# Patient Record
Sex: Female | Born: 1940 | Race: White | Hispanic: No | Smoking: Never smoker
Health system: Southern US, Community
[De-identification: ages and names within clinical notes are randomized; demographics above are authoritative.]

## PROBLEM LIST (undated history)

## (undated) DIAGNOSIS — F419 Anxiety disorder, unspecified: Secondary | ICD-10-CM

## (undated) DIAGNOSIS — E119 Type 2 diabetes mellitus without complications: Secondary | ICD-10-CM

## (undated) DIAGNOSIS — I872 Venous insufficiency (chronic) (peripheral): Secondary | ICD-10-CM

## (undated) DIAGNOSIS — E559 Vitamin D deficiency, unspecified: Secondary | ICD-10-CM

## (undated) DIAGNOSIS — E1142 Type 2 diabetes mellitus with diabetic polyneuropathy: Secondary | ICD-10-CM

## (undated) DIAGNOSIS — G40309 Generalized idiopathic epilepsy and epileptic syndromes, not intractable, without status epilepticus: Secondary | ICD-10-CM

## (undated) DIAGNOSIS — I2510 Atherosclerotic heart disease of native coronary artery without angina pectoris: Secondary | ICD-10-CM

## (undated) DIAGNOSIS — K219 Gastro-esophageal reflux disease without esophagitis: Secondary | ICD-10-CM

## (undated) DIAGNOSIS — Z9289 Personal history of other medical treatment: Secondary | ICD-10-CM

## (undated) DIAGNOSIS — Z8674 Personal history of sudden cardiac arrest: Secondary | ICD-10-CM

## (undated) DIAGNOSIS — R569 Unspecified convulsions: Secondary | ICD-10-CM

## (undated) DIAGNOSIS — E669 Obesity, unspecified: Secondary | ICD-10-CM

## (undated) DIAGNOSIS — I480 Paroxysmal atrial fibrillation: Secondary | ICD-10-CM

## (undated) DIAGNOSIS — I10 Essential (primary) hypertension: Secondary | ICD-10-CM

## (undated) DIAGNOSIS — I447 Left bundle-branch block, unspecified: Secondary | ICD-10-CM

## (undated) DIAGNOSIS — Z95810 Presence of automatic (implantable) cardiac defibrillator: Secondary | ICD-10-CM

## (undated) DIAGNOSIS — C801 Malignant (primary) neoplasm, unspecified: Secondary | ICD-10-CM

## (undated) DIAGNOSIS — I255 Ischemic cardiomyopathy: Secondary | ICD-10-CM

## (undated) DIAGNOSIS — I6529 Occlusion and stenosis of unspecified carotid artery: Secondary | ICD-10-CM

## (undated) HISTORY — DX: Gastro-esophageal reflux disease without esophagitis: K21.9

## (undated) HISTORY — DX: Ischemic cardiomyopathy: I25.5

## (undated) HISTORY — DX: Personal history of sudden cardiac arrest: Z86.74

## (undated) HISTORY — DX: Left bundle-branch block, unspecified: I44.7

## (undated) HISTORY — DX: Generalized idiopathic epilepsy and epileptic syndromes, not intractable, without status epilepticus: G40.309

## (undated) HISTORY — DX: Occlusion and stenosis of unspecified carotid artery: I65.29

## (undated) HISTORY — DX: Venous insufficiency (chronic) (peripheral): I87.2

## (undated) HISTORY — PX: PARTIAL HYSTERECTOMY: SHX80

## (undated) HISTORY — DX: Type 2 diabetes mellitus with diabetic polyneuropathy: E11.42

## (undated) HISTORY — DX: Atherosclerotic heart disease of native coronary artery without angina pectoris: I25.10

## (undated) HISTORY — DX: Obesity, unspecified: E66.9

## (undated) HISTORY — DX: Type 2 diabetes mellitus without complications: E11.9

## (undated) HISTORY — DX: Personal history of other medical treatment: Z92.89

## (undated) HISTORY — DX: Essential (primary) hypertension: I10

## (undated) HISTORY — DX: Vitamin D deficiency, unspecified: E55.9

## (undated) HISTORY — DX: Presence of automatic (implantable) cardiac defibrillator: Z95.810

---

## 1979-02-05 HISTORY — PX: CHOLECYSTECTOMY: SHX55

## 1984-06-06 HISTORY — PX: TOTAL ABDOMINAL HYSTERECTOMY: SHX209

## 2006-06-06 HISTORY — PX: CORONARY ARTERY BYPASS GRAFT: SHX141

## 2008-12-12 ENCOUNTER — Ambulatory Visit: Payer: Self-pay | Admitting: Vascular Surgery

## 2009-04-21 ENCOUNTER — Observation Stay (HOSPITAL_COMMUNITY): Admission: EM | Admit: 2009-04-21 | Discharge: 2009-04-22 | Payer: Self-pay | Admitting: Emergency Medicine

## 2009-10-19 ENCOUNTER — Emergency Department (HOSPITAL_COMMUNITY): Admission: EM | Admit: 2009-10-19 | Discharge: 2009-10-19 | Payer: Self-pay | Admitting: Emergency Medicine

## 2010-02-02 ENCOUNTER — Ambulatory Visit: Payer: Self-pay | Admitting: Cardiovascular Disease

## 2010-03-17 ENCOUNTER — Ambulatory Visit: Payer: Self-pay | Admitting: Cardiovascular Disease

## 2010-06-06 HISTORY — PX: SUBDURAL HEMATOMA EVACUATION VIA CRANIOTOMY: SUR319

## 2010-06-28 ENCOUNTER — Encounter: Payer: Self-pay | Admitting: Endocrinology

## 2010-08-17 ENCOUNTER — Ambulatory Visit (INDEPENDENT_AMBULATORY_CARE_PROVIDER_SITE_OTHER): Payer: PRIVATE HEALTH INSURANCE | Admitting: Cardiovascular Disease

## 2010-08-17 DIAGNOSIS — I119 Hypertensive heart disease without heart failure: Secondary | ICD-10-CM

## 2010-08-17 DIAGNOSIS — Z951 Presence of aortocoronary bypass graft: Secondary | ICD-10-CM

## 2010-08-23 LAB — DIFFERENTIAL
Basophils Absolute: 0 10*3/uL (ref 0.0–0.1)
Basophils Relative: 0 % (ref 0–1)
Lymphocytes Relative: 8 % — ABNORMAL LOW (ref 12–46)
Lymphs Abs: 0.7 10*3/uL (ref 0.7–4.0)
Monocytes Absolute: 0.4 10*3/uL (ref 0.1–1.0)
Monocytes Relative: 5 % (ref 3–12)
Neutrophils Relative %: 86 % — ABNORMAL HIGH (ref 43–77)

## 2010-08-23 LAB — CBC
Hemoglobin: 10.8 g/dL — ABNORMAL LOW (ref 12.0–15.0)
MCHC: 34 g/dL (ref 30.0–36.0)
Platelets: 147 10*3/uL — ABNORMAL LOW (ref 150–400)
RBC: 3.45 MIL/uL — ABNORMAL LOW (ref 3.87–5.11)

## 2010-08-23 LAB — URINALYSIS, ROUTINE W REFLEX MICROSCOPIC
Hgb urine dipstick: NEGATIVE
Protein, ur: NEGATIVE mg/dL
Urobilinogen, UA: 0.2 mg/dL (ref 0.0–1.0)

## 2010-08-23 LAB — BASIC METABOLIC PANEL
CO2: 27 mEq/L (ref 19–32)
GFR calc Af Amer: 41 mL/min — ABNORMAL LOW (ref >60)
Glucose, Bld: 306 mg/dL — ABNORMAL HIGH (ref 70–99)
Potassium: 4.8 mEq/L (ref 3.5–5.1)
Sodium: 134 mEq/L — ABNORMAL LOW (ref 135–145)

## 2010-08-23 LAB — URINE CULTURE: Colony Count: 70000

## 2010-08-31 ENCOUNTER — Ambulatory Visit: Payer: Self-pay | Admitting: Cardiovascular Disease

## 2010-08-31 ENCOUNTER — Other Ambulatory Visit: Payer: Self-pay | Admitting: Family Medicine

## 2010-08-31 ENCOUNTER — Ambulatory Visit
Admission: RE | Admit: 2010-08-31 | Discharge: 2010-08-31 | Disposition: A | Discharge status: Home / Self Care | Payer: PRIVATE HEALTH INSURANCE | Source: Ambulatory Visit | Attending: Family Medicine | Admitting: Family Medicine

## 2010-08-31 DIAGNOSIS — R109 Unspecified abdominal pain: Secondary | ICD-10-CM

## 2010-08-31 DIAGNOSIS — R10819 Abdominal tenderness, unspecified site: Secondary | ICD-10-CM

## 2010-09-06 ENCOUNTER — Encounter: Payer: Self-pay | Admitting: Cardiovascular Disease

## 2010-09-06 ENCOUNTER — Encounter: Payer: Self-pay | Admitting: Nurse Practitioner

## 2010-09-07 ENCOUNTER — Ambulatory Visit: Payer: PRIVATE HEALTH INSURANCE | Admitting: Nurse Practitioner

## 2010-09-08 LAB — CARDIAC PANEL(CRET KIN+CKTOT+MB+TROPI)
CK, MB: 6 ng/mL — ABNORMAL HIGH (ref 0.3–4.0)
Relative Index: 3.2 — ABNORMAL HIGH (ref 0.0–2.5)
Relative Index: 3.4 — ABNORMAL HIGH (ref 0.0–2.5)
Total CK: 190 U/L — ABNORMAL HIGH (ref 7–177)
Troponin I: 0.01 ng/mL (ref 0.00–0.06)
Troponin I: 0.02 ng/mL (ref 0.00–0.06)
Troponin I: 0.03 ng/mL (ref 0.00–0.06)

## 2010-09-08 LAB — COMPREHENSIVE METABOLIC PANEL
ALT: 48 U/L — ABNORMAL HIGH (ref 0–35)
AST: 54 U/L — ABNORMAL HIGH (ref 0–37)
Albumin: 3.2 g/dL — ABNORMAL LOW (ref 3.5–5.2)
CO2: 25 mEq/L (ref 19–32)
Calcium: 9.2 mg/dL (ref 8.4–10.5)
Chloride: 105 mEq/L (ref 96–112)
Creatinine, Ser: 1.98 mg/dL — ABNORMAL HIGH (ref 0.4–1.2)
Creatinine, Ser: 2.21 mg/dL — ABNORMAL HIGH (ref 0.4–1.2)
GFR calc Af Amer: 27 mL/min — ABNORMAL LOW (ref >60)
GFR calc Af Amer: 30 mL/min — ABNORMAL LOW (ref >60)
Glucose, Bld: 151 mg/dL — ABNORMAL HIGH (ref 70–99)
Potassium: 3.6 mEq/L (ref 3.5–5.1)
Sodium: 134 mEq/L — ABNORMAL LOW (ref 135–145)
Total Bilirubin: 0.6 mg/dL (ref 0.3–1.2)
Total Protein: 6.2 g/dL (ref 6.0–8.3)

## 2010-09-08 LAB — URINE MICROSCOPIC-ADD ON

## 2010-09-08 LAB — DIFFERENTIAL
Basophils Absolute: 0 10*3/uL (ref 0.0–0.1)
Eosinophils Absolute: 0.1 10*3/uL (ref 0.0–0.7)
Eosinophils Relative: 2 % (ref 0–5)
Monocytes Absolute: 0.5 10*3/uL (ref 0.1–1.0)
Monocytes Relative: 9 % (ref 3–12)
Neutro Abs: 3.6 10*3/uL (ref 1.7–7.7)
Neutrophils Relative %: 69 % (ref 43–77)

## 2010-09-08 LAB — CK TOTAL AND CKMB (NOT AT ARMC)
CK, MB: 12.9 ng/mL — ABNORMAL HIGH (ref 0.3–4.0)
Relative Index: 3.8 — ABNORMAL HIGH (ref 0.0–2.5)
Relative Index: 3.8 — ABNORMAL HIGH (ref 0.0–2.5)

## 2010-09-08 LAB — URINALYSIS, ROUTINE W REFLEX MICROSCOPIC
Bilirubin Urine: NEGATIVE
Glucose, UA: 100 mg/dL — AB
Nitrite: NEGATIVE
Specific Gravity, Urine: 1.016 (ref 1.005–1.030)
pH: 5 (ref 5.0–8.0)

## 2010-09-08 LAB — IRON AND TIBC
Saturation Ratios: 20 % (ref 20–55)
UIBC: 330 ug/dL

## 2010-09-08 LAB — POCT CARDIAC MARKERS
CKMB, poc: 10.9 ng/mL (ref 1.0–8.0)
Troponin i, poc: <0.05 ng/mL (ref 0.00–0.09)

## 2010-09-08 LAB — GLUCOSE, CAPILLARY: Glucose-Capillary: 175 mg/dL — ABNORMAL HIGH (ref 70–99)

## 2010-09-08 LAB — HEMOGLOBIN A1C: Hgb A1c MFr Bld: 9.1 % — ABNORMAL HIGH (ref 4.6–6.1)

## 2010-09-08 LAB — RETICULOCYTES: Retic Count, Absolute: 35.1 10*3/uL (ref 19.0–186.0)

## 2010-09-08 LAB — FOLATE: Folate: >20 ng/mL

## 2010-09-08 LAB — CBC
HCT: 28.6 % — ABNORMAL LOW (ref 36.0–46.0)
RBC: 3.13 MIL/uL — ABNORMAL LOW (ref 3.87–5.11)
WBC: 5.1 10*3/uL (ref 4.0–10.5)

## 2010-09-08 LAB — TSH: TSH: 3.475 u[IU]/mL (ref 0.350–4.500)

## 2010-09-08 LAB — D-DIMER, QUANTITATIVE: D-Dimer, Quant: 0.23 ug/mL-FEU (ref 0.00–0.48)

## 2010-09-09 ENCOUNTER — Encounter: Payer: Self-pay | Admitting: Nurse Practitioner

## 2010-09-14 ENCOUNTER — Encounter: Payer: Self-pay | Admitting: Nurse Practitioner

## 2010-09-14 ENCOUNTER — Ambulatory Visit (INDEPENDENT_AMBULATORY_CARE_PROVIDER_SITE_OTHER): Payer: PRIVATE HEALTH INSURANCE | Admitting: Nurse Practitioner

## 2010-09-14 VITALS — BP 160/80 | HR 60 | Ht 63.0 in | Wt 195.8 lb

## 2010-09-14 DIAGNOSIS — I2510 Atherosclerotic heart disease of native coronary artery without angina pectoris: Secondary | ICD-10-CM

## 2010-09-14 DIAGNOSIS — I480 Paroxysmal atrial fibrillation: Secondary | ICD-10-CM | POA: Insufficient documentation

## 2010-09-14 DIAGNOSIS — E669 Obesity, unspecified: Secondary | ICD-10-CM

## 2010-09-14 DIAGNOSIS — I4891 Unspecified atrial fibrillation: Secondary | ICD-10-CM

## 2010-09-14 DIAGNOSIS — I10 Essential (primary) hypertension: Secondary | ICD-10-CM

## 2010-09-14 MED ORDER — LISINOPRIL 10 MG PO TABS: 10.0000 mg | ORAL_TABLET | Freq: Two times a day (BID) | ORAL | Status: DC

## 2010-09-14 NOTE — Assessment & Plan Note (Signed)
Remains on coumadin. Is in sinus rhythm by exam today.

## 2010-09-14 NOTE — Patient Instructions (Signed)
Continue with your current medicines. We are going to increase the Lisinopril to 10mg  2 x a day. [facility] Monitor your blood pressure at home.  Record your readings and bring to your next visit. Limit sodium intake. Call for any problems. I will see you in a month.

## 2010-09-14 NOTE — Progress Notes (Signed)
History of Present Illness: Alexandra [name] is seen today for a follow up visit. She is seen for Dr. Elease Hashimoto. He has placed her on 20mg  of bystolic for her blood pressure. She is tolerating her medicines well. She has no real complaint. She does have some occasional chest pain, nothing like her prior chest pain syndrome when she had her surgery. She has not had follow up stress testing since her CABG in 2008. Blood pressure at home is still in the 140's. Blood sugar is improving.   Current Outpatient Prescriptions on File Prior to Visit  Medication Sig Dispense Refill  . aspirin 81 MG tablet Take 81 mg by mouth daily.        . insulin glargine (LANTUS) 100 UNIT/ML injection Inject into the skin. Taking 50 units in the morning and 35 units in the evening.      . lovastatin (MEVACOR) 40 MG tablet Take 40 mg by mouth 2 (two) times daily.        . nebivolol (BYSTOLIC) 10 MG tablet Take 20 mg by mouth daily.       . pantoprazole (PROTONIX) 40 MG tablet Take 40 mg by mouth daily.        . Vitamin D, Ergocalciferol, (DRISDOL) 50000 UNITS CAPS Take 50,000 Units by mouth every 7 (seven) days.        . Warfarin Sodium (COUMADIN PO) Take by mouth. As directed       . DISCONTD: lisinopril (PRINIVIL,ZESTRIL) 10 MG tablet Take 10 mg by mouth daily.        . fexofenadine (ALLEGRA) 180 MG tablet Take 180 mg by mouth daily.          Allergies  Allergen Reactions  . Sulfa Antibiotics     Past Medical History  Diagnosis Date  . HTN (hypertension)   . CAD (coronary artery disease)     Remote CABG in 2008  . DM (diabetes mellitus)   . HTN (hypertension)   . Atrial fibrillation   . GERD (gastroesophageal reflux disease)   . Breast cancer   . Venous insufficiency   . Left bundle branch block   . Obesity     Past Surgical History  Procedure Date  . Partial hysterectomy     1985  . Total abdominal hysterectomy 1986  . Coronary artery bypass graft 2008    x3 2008.  [place] (Dr. Edrick Oh) [facility].  [phone]  . Cholecystectomy     History  Smoking status  . Never Smoker   Smokeless tobacco  . Not on file    History  Alcohol Use: Not on file    Family History  Problem Relation Age of Onset  . Emphysema Father   . Hypertension Father   . Hypertension Mother   . Stroke Mother   . Diabetes Mother   . Diabetes Brother     Review of Systems: The review of systems is positive for occasional episodes of chest discomfort. She has no exertional symptoms. She has started walking on her treadmill. She is trying to lose weight. Coumdin is managed by her PCP.  All other systems were reviewed and are negative.  Physical Exam: BP 160/80  Pulse 60  Ht 5\' 3"  (1.6 m)  Wt 195 lb 12.8 oz (88.814 kg)  BMI 34.68 kg/m2 Patient is very pleasant and in no acute distress. She is obese. Skin is warm and dry. Color is normal.  HEENT is unremarkable. Normocephalic/atraumatic. PERRL. Sclera are nonicteric. Neck is supple.  No masses. No JVD. Lungs are clear. Cardiac exam shows a regular rate and rhythm. She appears to be in sinus rhythm today.  Abdomen is soft. Extremities are without edema. Gait and ROM are intact. No gross neurologic deficits noted.    Assessment / Plan:

## 2010-09-14 NOTE — Assessment & Plan Note (Signed)
Need to consider stress testing on return.

## 2010-09-14 NOTE — Assessment & Plan Note (Signed)
Weight loss is encouraged 

## 2010-09-14 NOTE — Assessment & Plan Note (Signed)
Blood pressure still not at goal. Lisinopril is increased to 10mg  BID. [facility]

## 2010-09-21 ENCOUNTER — Other Ambulatory Visit: Payer: Self-pay | Admitting: Cardiovascular Disease

## 2010-09-22 ENCOUNTER — Other Ambulatory Visit: Payer: Self-pay | Admitting: *Deleted

## 2010-09-22 DIAGNOSIS — E785 Hyperlipidemia, unspecified: Secondary | ICD-10-CM

## 2010-09-22 MED ORDER — LOVASTATIN 40 MG PO TABS: 40.0000 mg | ORAL_TABLET | Freq: Every day | ORAL | Status: DC

## 2010-09-22 MED ORDER — LOVASTATIN 40 MG PO TABS: 40.0000 mg | ORAL_TABLET | Freq: Two times a day (BID) | ORAL | Status: DC

## 2010-09-22 NOTE — Telephone Encounter (Signed)
Fax received from pharmacy. Refill completed. Jodette Isabela Nardelli RN  

## 2010-10-12 ENCOUNTER — Ambulatory Visit (INDEPENDENT_AMBULATORY_CARE_PROVIDER_SITE_OTHER): Payer: PRIVATE HEALTH INSURANCE | Admitting: Nurse Practitioner

## 2010-10-12 ENCOUNTER — Telehealth: Payer: Self-pay | Admitting: *Deleted

## 2010-10-12 ENCOUNTER — Other Ambulatory Visit (INDEPENDENT_AMBULATORY_CARE_PROVIDER_SITE_OTHER): Payer: PRIVATE HEALTH INSURANCE | Admitting: *Deleted

## 2010-10-12 ENCOUNTER — Encounter: Payer: Self-pay | Admitting: Nurse Practitioner

## 2010-10-12 VITALS — BP 130/80 | HR 60 | Wt 193.0 lb

## 2010-10-12 DIAGNOSIS — I10 Essential (primary) hypertension: Secondary | ICD-10-CM

## 2010-10-12 DIAGNOSIS — I2510 Atherosclerotic heart disease of native coronary artery without angina pectoris: Secondary | ICD-10-CM

## 2010-10-12 LAB — BASIC METABOLIC PANEL
BUN: 25 mg/dL — ABNORMAL HIGH (ref 6–23)
CO2: 29 mEq/L (ref 19–32)
Calcium: 9.3 mg/dL (ref 8.4–10.5)
Chloride: 105 mEq/L (ref 96–112)
Creatinine, Ser: 1.5 mg/dL — ABNORMAL HIGH (ref 0.4–1.2)
GFR: 37.07 mL/min — ABNORMAL LOW (ref >60.00)
Glucose, Bld: 140 mg/dL — ABNORMAL HIGH (ref 70–99)
Potassium: 4.4 mEq/L (ref 3.5–5.1)
Sodium: 142 mEq/L (ref 135–145)

## 2010-10-12 NOTE — Progress Notes (Signed)
Alexandra [name] Date of Birth: 1941-04-13   History of Present Illness: Alexandra [name] is seen back today for a follow up visit. She is seen for Dr. Elease [name]. It is a one month check. She has had her Lisinopril increased to BID for better blood pressure control. She is doing well. She is fatigued. She does not have chest pain. Her blood sugars are good. She is not lightheaded or dizzy. She has been checking her blood pressure at home and her readings remain high. Her cuff does not correlate here in the office. Coumadin is checked by primary care.   Current Outpatient Prescriptions on File Prior to Visit  Medication Sig Dispense Refill  . aspirin 81 MG tablet Take 81 mg by mouth daily.        . insulin aspart (NOVOLOG) 100 UNIT/ML injection Inject into the skin 3 (three) times daily before meals. As directed       . insulin glargine (LANTUS) 100 UNIT/ML injection Inject into the skin. Taking 50 units in the morning and 35 units in the evening.      Marland Kitchen lisinopril (PRINIVIL,ZESTRIL) 10 MG tablet Take 1 tablet (10 mg total) by mouth 2 (two) times daily.  60 tablet  11  . lovastatin (MEVACOR) 40 MG tablet Take 1 tablet (40 mg total) by mouth daily.  90 tablet  3  . nebivolol (BYSTOLIC) 10 MG tablet Take 20 mg by mouth daily.       . pantoprazole (PROTONIX) 40 MG tablet Take 40 mg by mouth daily.        . sertraline (ZOLOFT) 100 MG tablet Take 100 mg by mouth daily.        . Vitamin D, Ergocalciferol, (DRISDOL) 50000 UNITS CAPS Take 50,000 Units by mouth every 7 (seven) days.        . Warfarin Sodium (COUMADIN PO) Take by mouth. As directed       . fexofenadine (ALLEGRA) 180 MG tablet Take 180 mg by mouth daily.          Allergies  Allergen Reactions  . Sulfa Antibiotics     Past Medical History  Diagnosis Date  . HTN (hypertension)   . CAD (coronary artery disease)     Remote CABG in 2008  . DM (diabetes mellitus)   . HTN (hypertension)   . Atrial fibrillation   . GERD (gastroesophageal reflux  disease)   . Breast cancer   . Venous insufficiency   . Left bundle branch block   . Obesity   . Chronic anticoagulation     Past Surgical History  Procedure Date  . Partial hysterectomy     1985  . Total abdominal hysterectomy 1986  . Coronary artery bypass graft 2008    x3 2008.  [place] (Dr. Edrick [facility]) [facility] [phone]  . Cholecystectomy     History  Smoking status  . Never Smoker   Smokeless tobacco  . Never Used    History  Alcohol Use No    Family History  Problem Relation Age of Onset  . Emphysema Father   . Hypertension Father   . Hypertension Mother   . Stroke Mother   . Diabetes Mother   . Diabetes Brother     Review of Systems: The review of systems is positive for fatigue. She tries to exercise. She notes that her legs will tend to cramp up with activity. She has cramps at night. She will have some occasional edema. She is trying to watch  her salt. She denies chest pain.  All other systems were reviewed and are negative.  Physical Exam: BP 130/80  Pulse 60  Wt 193 lb (87.544 kg) Patient is very pleasant and in no acute distress. She is overweight. Skin is warm and dry. Color is normal.  HEENT is unremarkable. Normocephalic/atraumatic. PERRL. Sclera are nonicteric. Neck is supple. No masses. No JVD. Lungs are clear. Cardiac exam shows a regular rate and rhythm and she appears to be in sinus today by exam.  Abdomen is soft and obese.  Extremities are without significant edema. Gait and ROM are intact. No gross neurologic deficits noted.  LABORATORY DATA:  BMET is pending   Assessment / Plan:

## 2010-10-12 NOTE — Assessment & Plan Note (Signed)
Had CABG about 4 years ago in [place]. May need to consider stress testing on return. Regular exercise, good blood sugar control and weight loss is encouraged.

## 2010-10-12 NOTE — Patient Instructions (Signed)
Continue with your current medicines. Monitor your blood pressure at home. I would encourage you to get a new cuff. The Omron is a good brand to choose from. Record your readings and bring to your next visit. Limit sodium intake. Call for any problems. I will have you see Dr. Elease Hashimoto in 3 to 4 months.

## 2010-10-12 NOTE — Telephone Encounter (Signed)
Message copied by Mahalia Longest on Tue Oct 12, 2010  5:04 PM ------      Message from: Norma Fredrickson      Created: Tue Oct 12, 2010  2:19 PM       BMET is ok. Continue with current medicines.

## 2010-10-12 NOTE — Assessment & Plan Note (Signed)
Her cuff does not correlate. Repeat blood pressure check by me is satisfactory as well. I do not think she needs additional medicines at this time. She is encouraged to obtain a new cuff. I will have her see Dr. Elease Hashimoto back in about 3 to 4 months. Patient is agreeable to this plan and will call if any problems develop in the interim.

## 2010-10-12 NOTE — Telephone Encounter (Signed)
Patient called with lab results. Pt verbalized understanding. Jodette Kyree Adriano RN  

## 2010-10-29 ENCOUNTER — Telehealth: Payer: Self-pay | Admitting: Cardiovascular Disease

## 2010-10-29 ENCOUNTER — Other Ambulatory Visit: Payer: Self-pay | Admitting: *Deleted

## 2010-10-29 DIAGNOSIS — I10 Essential (primary) hypertension: Secondary | ICD-10-CM

## 2010-10-29 MED ORDER — LISINOPRIL 10 MG PO TABS: 10.0000 mg | ORAL_TABLET | Freq: Two times a day (BID) | ORAL | Status: DC

## 2010-10-29 NOTE — Telephone Encounter (Signed)
Pt wants 90 day supply, ordered, jodette Carsyn Taubman rn

## 2010-10-29 NOTE — Telephone Encounter (Signed)
PT ASKING FOR 90 DAY SUPPLY OF LINISIPRIL CALLED INTO [facility]. PT'S DOSE WAS INCREASED AND NOW SHE IS RUNNING SHORT. CHART IN BOX.

## 2010-12-02 ENCOUNTER — Encounter: Payer: Self-pay | Admitting: Podiatry

## 2010-12-06 ENCOUNTER — Other Ambulatory Visit: Payer: Self-pay | Admitting: *Deleted

## 2010-12-06 MED ORDER — NEBIVOLOL HCL 10 MG PO TABS: 20.0000 mg | ORAL_TABLET | Freq: Every day | ORAL | Status: DC

## 2010-12-06 NOTE — Telephone Encounter (Signed)
Fax received from pharmacy. Refill completed. Jodette Henryetta Corriveau RN  

## 2010-12-27 ENCOUNTER — Emergency Department (HOSPITAL_COMMUNITY): Payer: PRIVATE HEALTH INSURANCE

## 2010-12-27 ENCOUNTER — Emergency Department (HOSPITAL_COMMUNITY)
Admission: EM | Admit: 2010-12-27 | Discharge: 2010-12-27 | Disposition: A | Discharge status: Home / Self Care | Payer: PRIVATE HEALTH INSURANCE | Source: Home / Self Care

## 2010-12-27 DIAGNOSIS — S0003XA Contusion of scalp, initial encounter: Secondary | ICD-10-CM | POA: Insufficient documentation

## 2010-12-27 DIAGNOSIS — E7800 Pure hypercholesterolemia, unspecified: Secondary | ICD-10-CM | POA: Insufficient documentation

## 2010-12-27 DIAGNOSIS — Z79899 Other long term (current) drug therapy: Secondary | ICD-10-CM | POA: Insufficient documentation

## 2010-12-27 DIAGNOSIS — I4891 Unspecified atrial fibrillation: Secondary | ICD-10-CM | POA: Insufficient documentation

## 2010-12-27 DIAGNOSIS — S51009A Unspecified open wound of unspecified elbow, initial encounter: Secondary | ICD-10-CM | POA: Insufficient documentation

## 2010-12-27 DIAGNOSIS — Z853 Personal history of malignant neoplasm of breast: Secondary | ICD-10-CM | POA: Insufficient documentation

## 2010-12-27 DIAGNOSIS — R220 Localized swelling, mass and lump, head: Secondary | ICD-10-CM | POA: Insufficient documentation

## 2010-12-27 DIAGNOSIS — Y92009 Unspecified place in unspecified non-institutional (private) residence as the place of occurrence of the external cause: Secondary | ICD-10-CM | POA: Insufficient documentation

## 2010-12-27 DIAGNOSIS — I10 Essential (primary) hypertension: Secondary | ICD-10-CM | POA: Insufficient documentation

## 2010-12-27 DIAGNOSIS — S1093XA Contusion of unspecified part of neck, initial encounter: Secondary | ICD-10-CM | POA: Insufficient documentation

## 2010-12-27 DIAGNOSIS — E119 Type 2 diabetes mellitus without complications: Secondary | ICD-10-CM | POA: Insufficient documentation

## 2010-12-27 DIAGNOSIS — R221 Localized swelling, mass and lump, neck: Secondary | ICD-10-CM | POA: Insufficient documentation

## 2010-12-27 DIAGNOSIS — W1809XA Striking against other object with subsequent fall, initial encounter: Secondary | ICD-10-CM | POA: Insufficient documentation

## 2010-12-27 DIAGNOSIS — M5020 Other cervical disc displacement, unspecified cervical region: Secondary | ICD-10-CM | POA: Insufficient documentation

## 2010-12-27 LAB — DIFFERENTIAL
Basophils Absolute: 0 10*3/uL (ref 0.0–0.1)
Basophils Relative: 0 % (ref 0–1)
Eosinophils Absolute: 0.1 10*3/uL (ref 0.0–0.7)
Eosinophils Relative: 2 % (ref 0–5)
Lymphs Abs: 0.8 10*3/uL (ref 0.7–4.0)

## 2010-12-27 LAB — CBC
MCV: 86 fL (ref 78.0–100.0)
Platelets: 124 10*3/uL — ABNORMAL LOW (ref 150–400)
RDW: 12.3 % (ref 11.5–15.5)
WBC: 5.9 10*3/uL (ref 4.0–10.5)

## 2010-12-27 LAB — BASIC METABOLIC PANEL
BUN: 30 mg/dL — ABNORMAL HIGH (ref 6–23)
GFR calc non Af Amer: 29 mL/min — ABNORMAL LOW (ref >60)
Glucose, Bld: 289 mg/dL — ABNORMAL HIGH (ref 70–99)
Potassium: 3.7 mEq/L (ref 3.5–5.1)

## 2010-12-29 ENCOUNTER — Inpatient Hospital Stay (HOSPITAL_COMMUNITY)
Admission: EM | Admit: 2010-12-29 | Discharge: 2011-01-05 | DRG: 208 | Disposition: A | Discharge status: Skilled Nursing Facility | Payer: PRIVATE HEALTH INSURANCE | Attending: Internal Medicine | Admitting: Internal Medicine

## 2010-12-29 ENCOUNTER — Emergency Department (HOSPITAL_COMMUNITY): Payer: PRIVATE HEALTH INSURANCE

## 2010-12-29 DIAGNOSIS — S82839A Other fracture of upper and lower end of unspecified fibula, initial encounter for closed fracture: Secondary | ICD-10-CM | POA: Diagnosis present

## 2010-12-29 DIAGNOSIS — I10 Essential (primary) hypertension: Secondary | ICD-10-CM | POA: Diagnosis present

## 2010-12-29 DIAGNOSIS — I2510 Atherosclerotic heart disease of native coronary artery without angina pectoris: Secondary | ICD-10-CM | POA: Diagnosis present

## 2010-12-29 DIAGNOSIS — S066X9A Traumatic subarachnoid hemorrhage with loss of consciousness of unspecified duration, initial encounter: Secondary | ICD-10-CM | POA: Diagnosis present

## 2010-12-29 DIAGNOSIS — E876 Hypokalemia: Secondary | ICD-10-CM | POA: Diagnosis not present

## 2010-12-29 DIAGNOSIS — N179 Acute kidney failure, unspecified: Secondary | ICD-10-CM | POA: Diagnosis present

## 2010-12-29 DIAGNOSIS — Z951 Presence of aortocoronary bypass graft: Secondary | ICD-10-CM

## 2010-12-29 DIAGNOSIS — Z23 Encounter for immunization: Secondary | ICD-10-CM

## 2010-12-29 DIAGNOSIS — I4891 Unspecified atrial fibrillation: Secondary | ICD-10-CM

## 2010-12-29 DIAGNOSIS — J9600 Acute respiratory failure, unspecified whether with hypoxia or hypercapnia: Principal | ICD-10-CM | POA: Diagnosis present

## 2010-12-29 DIAGNOSIS — D649 Anemia, unspecified: Secondary | ICD-10-CM | POA: Diagnosis not present

## 2010-12-29 DIAGNOSIS — S92309A Fracture of unspecified metatarsal bone(s), unspecified foot, initial encounter for closed fracture: Secondary | ICD-10-CM | POA: Diagnosis present

## 2010-12-29 DIAGNOSIS — E785 Hyperlipidemia, unspecified: Secondary | ICD-10-CM | POA: Diagnosis present

## 2010-12-29 DIAGNOSIS — G40802 Other epilepsy, not intractable, without status epilepticus: Secondary | ICD-10-CM | POA: Diagnosis present

## 2010-12-29 DIAGNOSIS — Z79899 Other long term (current) drug therapy: Secondary | ICD-10-CM

## 2010-12-29 DIAGNOSIS — Y9389 Activity, other specified: Secondary | ICD-10-CM

## 2010-12-29 DIAGNOSIS — E119 Type 2 diabetes mellitus without complications: Secondary | ICD-10-CM | POA: Diagnosis present

## 2010-12-29 DIAGNOSIS — Z9181 History of falling: Secondary | ICD-10-CM

## 2010-12-29 DIAGNOSIS — G40401 Other generalized epilepsy and epileptic syndromes, not intractable, with status epilepticus: Secondary | ICD-10-CM

## 2010-12-29 DIAGNOSIS — S42213A Unspecified displaced fracture of surgical neck of unspecified humerus, initial encounter for closed fracture: Secondary | ICD-10-CM | POA: Diagnosis present

## 2010-12-29 DIAGNOSIS — Z7901 Long term (current) use of anticoagulants: Secondary | ICD-10-CM

## 2010-12-29 DIAGNOSIS — Y998 Other external cause status: Secondary | ICD-10-CM

## 2010-12-29 DIAGNOSIS — W1809XA Striking against other object with subsequent fall, initial encounter: Secondary | ICD-10-CM | POA: Diagnosis present

## 2010-12-29 DIAGNOSIS — Z853 Personal history of malignant neoplasm of breast: Secondary | ICD-10-CM

## 2010-12-29 DIAGNOSIS — Y92009 Unspecified place in unspecified non-institutional (private) residence as the place of occurrence of the external cause: Secondary | ICD-10-CM

## 2010-12-29 LAB — CBC
HCT: 37 % (ref 36.0–46.0)
Hemoglobin: 12.4 g/dL (ref 12.0–15.0)
MCH: 30.9 pg (ref 26.0–34.0)
MCHC: 33.5 g/dL (ref 30.0–36.0)

## 2010-12-29 LAB — DIFFERENTIAL
Basophils Relative: 0 % (ref 0–1)
Lymphocytes Relative: 27 % (ref 12–46)
Monocytes Absolute: 0.9 10*3/uL (ref 0.1–1.0)
Monocytes Relative: 6 % (ref 3–12)
Neutro Abs: 11.4 10*3/uL — ABNORMAL HIGH (ref 1.7–7.7)

## 2010-12-29 LAB — COMPREHENSIVE METABOLIC PANEL
Alkaline Phosphatase: 81 U/L (ref 39–117)
BUN: 24 mg/dL — ABNORMAL HIGH (ref 6–23)
Chloride: 100 mEq/L (ref 96–112)
GFR calc Af Amer: 35 mL/min — ABNORMAL LOW (ref >60)
Glucose, Bld: 232 mg/dL — ABNORMAL HIGH (ref 70–99)
Potassium: 4.4 mEq/L (ref 3.5–5.1)
Total Bilirubin: 0.3 mg/dL (ref 0.3–1.2)

## 2010-12-29 LAB — CK TOTAL AND CKMB (NOT AT ARMC): Total CK: 175 U/L (ref 7–177)

## 2010-12-30 ENCOUNTER — Inpatient Hospital Stay (HOSPITAL_COMMUNITY)
Admit: 2010-12-30 | Discharge: 2010-12-30 | Disposition: A | Discharge status: Home / Self Care | Payer: PRIVATE HEALTH INSURANCE | Attending: Emergency Medicine | Admitting: Emergency Medicine

## 2010-12-30 ENCOUNTER — Inpatient Hospital Stay (HOSPITAL_COMMUNITY): Payer: PRIVATE HEALTH INSURANCE

## 2010-12-30 LAB — BLOOD GAS, ARTERIAL
Acid-base deficit: 2.9 mmol/L — ABNORMAL HIGH (ref 0.0–2.0)
Drawn by: 347861
FIO2: 1 %
O2 Saturation: 99.3 %
RATE: 12 resp/min
TCO2: 19.7 mmol/L (ref 0–100)
pO2, Arterial: 209 mmHg — ABNORMAL HIGH (ref 80.0–100.0)

## 2010-12-30 LAB — URINALYSIS, ROUTINE W REFLEX MICROSCOPIC
Bilirubin Urine: NEGATIVE
Glucose, UA: >1000 mg/dL — AB
Ketones, ur: NEGATIVE mg/dL
Leukocytes, UA: NEGATIVE
pH: 5 (ref 5.0–8.0)

## 2010-12-30 LAB — URINE MICROSCOPIC-ADD ON

## 2010-12-30 LAB — CARDIAC PANEL(CRET KIN+CKTOT+MB+TROPI)
CK, MB: 6.2 ng/mL (ref 0.3–4.0)
Total CK: 195 U/L — ABNORMAL HIGH (ref 7–177)
Troponin I: <0.3 ng/mL (ref <0.30)

## 2010-12-30 LAB — BASIC METABOLIC PANEL
BUN: 21 mg/dL (ref 6–23)
Calcium: 8.1 mg/dL — ABNORMAL LOW (ref 8.4–10.5)
Creatinine, Ser: 1.24 mg/dL — ABNORMAL HIGH (ref 0.50–1.10)
GFR calc Af Amer: 52 mL/min — ABNORMAL LOW (ref >60)
GFR calc non Af Amer: 43 mL/min — ABNORMAL LOW (ref >60)
Glucose, Bld: 241 mg/dL — ABNORMAL HIGH (ref 70–99)

## 2010-12-30 LAB — PROTIME-INR
INR: 1.47 (ref 0.00–1.49)
Prothrombin Time: 18.1 seconds — ABNORMAL HIGH (ref 11.6–15.2)

## 2010-12-30 LAB — CBC
HCT: 29.3 % — ABNORMAL LOW (ref 36.0–46.0)
MCHC: 34.8 g/dL (ref 30.0–36.0)
Platelets: 99 10*3/uL — ABNORMAL LOW (ref 150–400)
RDW: 12.6 % (ref 11.5–15.5)
WBC: 7.7 10*3/uL (ref 4.0–10.5)

## 2010-12-30 LAB — GLUCOSE, CAPILLARY
Glucose-Capillary: 235 mg/dL — ABNORMAL HIGH (ref 70–99)
Glucose-Capillary: 170 mg/dL — ABNORMAL HIGH (ref 70–99)

## 2010-12-30 LAB — MRSA PCR SCREENING: MRSA by PCR: NEGATIVE

## 2010-12-31 ENCOUNTER — Inpatient Hospital Stay (HOSPITAL_COMMUNITY): Payer: PRIVATE HEALTH INSURANCE

## 2010-12-31 LAB — CBC
HCT: 28.2 % — ABNORMAL LOW (ref 36.0–46.0)
Hemoglobin: 9.8 g/dL — ABNORMAL LOW (ref 12.0–15.0)
MCH: 31.3 pg (ref 26.0–34.0)
MCHC: 34.8 g/dL (ref 30.0–36.0)
RBC: 3.13 MIL/uL — ABNORMAL LOW (ref 3.87–5.11)

## 2010-12-31 LAB — SODIUM, URINE, RANDOM: Sodium, Ur: 147 mEq/L

## 2010-12-31 LAB — BLOOD GAS, ARTERIAL
Bicarbonate: 22.2 mEq/L (ref 20.0–24.0)
MECHVT: 0.45 mL
O2 Saturation: 98.6 %
Patient temperature: 98.6
TCO2: 20.6 mmol/L (ref 0–100)
pH, Arterial: 7.385 (ref 7.350–7.400)

## 2010-12-31 LAB — GLUCOSE, CAPILLARY
Glucose-Capillary: 144 mg/dL — ABNORMAL HIGH (ref 70–99)
Glucose-Capillary: 148 mg/dL — ABNORMAL HIGH (ref 70–99)
Glucose-Capillary: 275 mg/dL — ABNORMAL HIGH (ref 70–99)
Glucose-Capillary: 211 mg/dL — ABNORMAL HIGH (ref 70–99)

## 2010-12-31 LAB — COMPREHENSIVE METABOLIC PANEL
BUN: 16 mg/dL (ref 6–23)
CO2: 22 mEq/L (ref 19–32)
Calcium: 8.1 mg/dL — ABNORMAL LOW (ref 8.4–10.5)
Chloride: 109 mEq/L (ref 96–112)
Creatinine, Ser: 1.22 mg/dL — ABNORMAL HIGH (ref 0.50–1.10)
GFR calc Af Amer: 53 mL/min — ABNORMAL LOW (ref >60)
GFR calc non Af Amer: 44 mL/min — ABNORMAL LOW (ref >60)
Glucose, Bld: 139 mg/dL — ABNORMAL HIGH (ref 70–99)
Total Bilirubin: 0.5 mg/dL (ref 0.3–1.2)

## 2010-12-31 LAB — URINE CULTURE

## 2010-12-31 LAB — PROCALCITONIN: Procalcitonin: 0.14 ng/mL

## 2010-12-31 LAB — PROTIME-INR: INR: 1.46 (ref 0.00–1.49)

## 2010-12-31 LAB — CREATININE, URINE, RANDOM: Creatinine, Urine: 90.8 mg/dL

## 2010-12-31 NOTE — Procedures (Unsigned)
REFERRING PHYSICIAN:  Dr. Bernette Mayers.  HISTORY:  A 70 year old woman with hyperglycemia, falls, had a recent fall with laceration above the left eyebrow.  The patient had a grand mall seizure in the emergency department and was intubated and on mechanical ventilation, known history of atrial fibrillation and diabetes mellitus.  MEDICATIONS:  She is on Versed, Dilantin, fentanyl, Coumadin.  She has been off sedation for at least 3 hours before EEG.  EEG DURATION:  This is 21.5 minutes of EEG recording.  EEG DESCRIPTION:  This is a routine 18 channel adult EEG recording with one channel devoted to limited EKG recording.  Activation procedure is not performed during the study, and the study does not reflect change in state sleep versus awake.  As the EEG opens up, I do not see any clear location for an obvious posterior dominant rhythm.  The background rhythm is showing generalized slowing in mid theta ranges up to 6 Hz at the most.  There is no asymmetry.  There is no asymmetry.  I do see generalized attenuation at times for couple of seconds during the tracing, but I do not see any electrographic seizures or epileptiform discharges in this study.  No sleep architecture was also observed on the study either.  This study is reactive.  EEG INTERPRETATION:  This is an abnormal EEG due to the presence of theta range slowing that is reactive.  There is no evidence to however to suggest seizures or epileptiform discharges on the study.  CLINICAL NOTE:  This degree of theta range slowing with reactivity suggest a moderate degree of nonspecific encephalopathy and can be seen in a variety of etiologies including toxic, metabolic, infectious, or structural.  Clinical correlation is advised.          ______________________________ Levie Heritage, MD    OZ:HYQM D:  12/30/2010 17:18:40  T:  12/31/2010 01:58:44  Job #:  [id number]

## 2011-01-01 ENCOUNTER — Inpatient Hospital Stay (HOSPITAL_COMMUNITY): Payer: PRIVATE HEALTH INSURANCE

## 2011-01-01 LAB — COMPREHENSIVE METABOLIC PANEL
ALT: 19 U/L (ref 0–35)
Albumin: 2.4 g/dL — ABNORMAL LOW (ref 3.5–5.2)
Alkaline Phosphatase: 53 U/L (ref 39–117)
BUN: 18 mg/dL (ref 6–23)
Chloride: 106 mEq/L (ref 96–112)
GFR calc Af Amer: 59 mL/min — ABNORMAL LOW (ref >60)
Glucose, Bld: 232 mg/dL — ABNORMAL HIGH (ref 70–99)
Potassium: 3.6 mEq/L (ref 3.5–5.1)
Sodium: 137 mEq/L (ref 135–145)
Total Bilirubin: 0.6 mg/dL (ref 0.3–1.2)
Total Protein: 5.6 g/dL — ABNORMAL LOW (ref 6.0–8.3)

## 2011-01-01 LAB — GLUCOSE, CAPILLARY
Glucose-Capillary: 199 mg/dL — ABNORMAL HIGH (ref 70–99)
Glucose-Capillary: 343 mg/dL — ABNORMAL HIGH (ref 70–99)
Glucose-Capillary: 221 mg/dL — ABNORMAL HIGH (ref 70–99)

## 2011-01-01 LAB — CBC
HCT: 28 % — ABNORMAL LOW (ref 36.0–46.0)
Hemoglobin: 9.6 g/dL — ABNORMAL LOW (ref 12.0–15.0)
MCH: 31.4 pg (ref 26.0–34.0)
MCHC: 34.3 g/dL (ref 30.0–36.0)

## 2011-01-02 DIAGNOSIS — R55 Syncope and collapse: Secondary | ICD-10-CM

## 2011-01-02 LAB — GLUCOSE, CAPILLARY
Glucose-Capillary: 205 mg/dL — ABNORMAL HIGH (ref 70–99)
Glucose-Capillary: 225 mg/dL — ABNORMAL HIGH (ref 70–99)
Glucose-Capillary: 274 mg/dL — ABNORMAL HIGH (ref 70–99)

## 2011-01-02 LAB — TSH: TSH: 2.885 u[IU]/mL (ref 0.350–4.500)

## 2011-01-02 LAB — CBC
MCH: 31.2 pg (ref 26.0–34.0)
MCHC: 34.8 g/dL (ref 30.0–36.0)
MCV: 89.5 fL (ref 78.0–100.0)
Platelets: 99 10*3/uL — ABNORMAL LOW (ref 150–400)
RDW: 12.7 % (ref 11.5–15.5)

## 2011-01-02 LAB — COMPREHENSIVE METABOLIC PANEL
Alkaline Phosphatase: 57 U/L (ref 39–117)
BUN: 19 mg/dL (ref 6–23)
GFR calc Af Amer: 56 mL/min — ABNORMAL LOW (ref >60)
Glucose, Bld: 267 mg/dL — ABNORMAL HIGH (ref 70–99)
Potassium: 3.6 mEq/L (ref 3.5–5.1)
Total Protein: 5.6 g/dL — ABNORMAL LOW (ref 6.0–8.3)

## 2011-01-02 LAB — HEMOGLOBIN A1C: Mean Plasma Glucose: 194 mg/dL — ABNORMAL HIGH (ref <117)

## 2011-01-03 DIAGNOSIS — I517 Cardiomegaly: Secondary | ICD-10-CM

## 2011-01-03 DIAGNOSIS — R0989 Other specified symptoms and signs involving the circulatory and respiratory systems: Secondary | ICD-10-CM

## 2011-01-03 LAB — PROTIME-INR: INR: 1.56 — ABNORMAL HIGH (ref 0.00–1.49)

## 2011-01-03 LAB — GLUCOSE, CAPILLARY
Glucose-Capillary: 155 mg/dL — ABNORMAL HIGH (ref 70–99)
Glucose-Capillary: 199 mg/dL — ABNORMAL HIGH (ref 70–99)

## 2011-01-03 LAB — BASIC METABOLIC PANEL
Calcium: 9 mg/dL (ref 8.4–10.5)
GFR calc non Af Amer: 50 mL/min — ABNORMAL LOW (ref >60)
Potassium: 3.5 mEq/L (ref 3.5–5.1)
Sodium: 138 mEq/L (ref 135–145)

## 2011-01-03 LAB — CBC
HCT: 29.2 % — ABNORMAL LOW (ref 36.0–46.0)
MCV: 89.8 fL (ref 78.0–100.0)
Platelets: 126 10*3/uL — ABNORMAL LOW (ref 150–400)
RBC: 3.25 MIL/uL — ABNORMAL LOW (ref 3.87–5.11)
RDW: 12.7 % (ref 11.5–15.5)
WBC: 5.3 10*3/uL (ref 4.0–10.5)

## 2011-01-04 LAB — CBC
HCT: 28.3 % — ABNORMAL LOW (ref 36.0–46.0)
Hemoglobin: 10 g/dL — ABNORMAL LOW (ref 12.0–15.0)
MCH: 31.6 pg (ref 26.0–34.0)
MCV: 89.6 fL (ref 78.0–100.0)
RBC: 3.16 MIL/uL — ABNORMAL LOW (ref 3.87–5.11)
WBC: 3.8 10*3/uL — ABNORMAL LOW (ref 4.0–10.5)

## 2011-01-04 LAB — GLUCOSE, CAPILLARY
Glucose-Capillary: 203 mg/dL — ABNORMAL HIGH (ref 70–99)
Glucose-Capillary: 145 mg/dL — ABNORMAL HIGH (ref 70–99)
Glucose-Capillary: 204 mg/dL — ABNORMAL HIGH (ref 70–99)

## 2011-01-05 LAB — GLUCOSE, CAPILLARY
Glucose-Capillary: 129 mg/dL — ABNORMAL HIGH (ref 70–99)
Glucose-Capillary: 188 mg/dL — ABNORMAL HIGH (ref 70–99)
Glucose-Capillary: 188 mg/dL — ABNORMAL HIGH (ref 70–99)

## 2011-01-05 NOTE — Consult Note (Signed)
  NAMEVINCENTINA, Alexandra [name] NO.:  [id number]  MEDICAL RECORD NO.:  [id number]  LOCATION:                                 FACILITY:  PHYSICIAN:  Myrtie Neither, MD      DATE OF BIRTH:  05/29/1941  DATE OF CONSULTATION: DATE OF DISCHARGE:                                CONSULTATION   ADDENDUM  PREVIOUS REASON FOR CONSULTATION:  Fractured right humeral head and contused left foot sustained after a fall at home. The patient was in ICU, presently he has been placed on 1518 on the floor after being stabilized from his seizure.  The patient was found to have tender swelling left fibular head.  X-ray revealed transverse fracture of the left fibular head.  Previous x-rays that were done on the left foot revealed fractured metatarsal head of the 4th and 5th toes of the left foot.  The patient will be placed in fracture shoe on the left side for the left foot.  The fibular head fracture will only be observed, nondisplaced, not requiring any knee immobilization which would hinder the patient's ability to ambulate.  We will follow the patient in the office in 2-week period.     Myrtie Neither, MD     AC/MEDQ  D:  01/02/2011  T:  01/02/2011  Job:  [id number]  Electronically Signed by Myrtie Neither MD on 01/05/2011 12:27:49 PM

## 2011-01-05 NOTE — Discharge Summary (Signed)
[name], [name] NO.:  [id number]  MEDICAL RECORD NO.:  [id number]  LOCATION:  1518                         FACILITY:  [facility]  PHYSICIAN:  Alexandra Harvest, MD    DATE OF BIRTH:  04/18/1941  DATE OF ADMISSION:  12/29/2010 DATE OF DISCHARGE:  01/05/2011                              DISCHARGE SUMMARY   PRIMARY CARE PHYSICIAN:  Alexandra [name], M.D. of [facility].  DISCHARGE DIAGNOSES: 1. Status post fall felt to be secondary to seizures. 2. New-onset seizures. 3. Acute renal failure, resolved. 4. Fracture of the right proximal humerus/left nondisplaced foot     fracture. 5. History of breast cancer status post XRT and lumpectomy. 6. History of atrial fibrillation, was on Coumadin, however, this has     been discontinued during this hospitalization. 7. Hypertension. 8. Hyperlipidemia. 9. Diabetes. 10.Coronary artery disease. 11.Status post cholecystectomy. 12.Status post total abdominal hysterectomy and bilateral salpingo-     oophorectomy. 13.Status post lumpectomy. 14.Small amount of subarachnoid blood at the left vertex per MRI,     likely traumatic in nature secondary to falls. 15.Acute respiratory failure secondary to seizures, resolved.  DISCHARGE MEDICATIONS: 1. Norvasc 5 mg p.o. daily. 2. Lisinopril 20 mg p.o. daily. 3. Dilantin 100 mg p.o. t.i.d. 4. Tessalon Perles 200 mg p.o. t.i.d. x5 days. 5. Bystolic 20 mg p.o. daily. 6. Diazepam 5 mg p.o. b.i.d. p.r.n. 7. Lasix 40 mg p.o. daily. 8. Allegra 180 mg p.o. daily p.r.n. 9. Hydrocodone/APAP 10/325 half to one tablet p.o. q.i.d. p.r.n. 10.Lantus 50 units in the morning, 35 units in the evening. 11.Lovastatin 40 mg 2 tablets p.o. daily. 12.NovoLog FlexPen 12-14 units subcutaneously t.i.d. with meals. 13.Nystatin and triamcinolone 1 application topically twice daily as     needed. 14.Protonix 40 mg p.o. daily. 15.Sertraline 100 mg p.o. daily. 16.Vitamin D 50,000 units p.o.  q. weekly.  DISPOSITION AND FOLLOWUP:  The patient will be discharged to a skilled nursing facility.  The patient is to follow up with orthopedist, Dr. Myrtie [name] on January 19, 2011 at 1:15 p.m., his phone number is [phone], to follow up on right proximal humeral fracture and her left nondisplaced foot fracture.  The patient is also to follow up with [facility] in 1 month for followup on her seizures.  The patient is also to follow up with PCP in about 1-2 weeks. On followup, BMET would need to be checked to follow up on electrolytes and renal function.  The patient's blood pressure would need to be reassessed at the nursing home and blood pressure medications adjusted accordingly.  The patient also needs to follow up with Dr. Elease [name] of Cardiology to discuss whether she is still a candidate for her Coumadin as patient had multiple falls secondary to probable seizures during this hospitalization.  MRI which was done during this hospitalization did show a small amount of subarachnoid blood at the vertex on the left, which was felt traumatic in nature and as such Coumadin was discontinued during this hospitalization.  Will be deferred to her cardiologist as to the risk and benefit ratio of maintaining patient on her Coumadin.  CONSULTATIONS DONE: 1. A Neurology consultation was done.  The patient was seen in     consultation by Dr. Marjory [name] on December 30, 2010. 2. Orthopedic consultation was done.  The patient was seen in     consultation by Dr. Myrtie [name] on December 30, 2010.  PROCEDURES PERFORMED: 1. An x-ray of the right humerus was done on December 29, 2010 that showed     a displaced right humeral neck fracture. 2. Chest x-ray done on December 29, 2010 showed appropriately positioned     endotracheal tube. 3. The patient was intubated on December 29, 2010. 4. CT of the head and C-spine were done on December 29, 2010.  Head CT was     negative for bleed or other acute  intracranial process.  CT of the     C-spine was negative for fracture or other acute bony     abnormalities.  Mild degenerative changes as above.  Bilateral     carotid bifurcation plaque. 5. Chest x-ray done on December 30, 2010 showed a stable chest x-ray with     mild left basilar atelectasis. 6. X-ray of the foot done on December 30, 2010 showed a nondisplaced     fracture of the necks of the left third and fourth metatarsals. 7. Chest x-ray done on December 31, 2010 showed no interval change,     interstitial edema. 8. MRI of the brain done on December 31, 2010 shows no acute infarction,     age-related atrophy, small amount of subarachnoid blood at the     vertex on the left presumably related to recent falls. 9. Chest x-ray done on January 01, 2011 shows status post extubation,     persistent mild interstitial prominence, likely edema, slight     increase in the bibasilar atelectasis. 10.Left tib-fib was done on January 01, 2011 that showed comminuted     transverse fracture of the proximal fibula. 11.A 2D echo was done on January 03, 2011 that showed a technically     limited study, wall thickness was increased in the pattern of mild     LVH, EF of 60%, regional wall motion abnormalities cannot be     excluded. 12.A carotid Doppler was done on January 03, 2011 that showed no     significant extracranial carotid artery stenosis demonstrated.     Vertebrals are patent with antegrade flow. 13.EEG which was done on December 30, 2010 showed an abnormal EEG due to     presence of theta range slowing that is reactive.  There is no     evidence however to suggest seizures or epileptiform discharges on     the study. I personally took care of the patient on the day of discharge on January 05, 2011.  BRIEF ADMISSION HISTORY:  Alexandra [name] is a 70 year old female with history of breast cancer, AFIB on chronic anticoagulation, hypertension, hyperlipidemia, diabetes, coronary artery disease with recent hospital  evaluation on July 23 for fall.  The patient was found to be confused on the phone at the time I was talking to her son. Apparently, the son had patient fall down over the phone.  EMS was called.  The patient was brought to the ED and was complaining of right upper extremity pain.  In the emergency room, the patient was noted to have generalized convulsive seizures with hypoxia and was as such intubated secondary to respiratory failure secondary to her convulsive seizures.  She was loaded with IV Dilantin and intubated and admitted to the Critical Care Service.  For rest of the hospitalization, please see the H and P done per Critical Care Medicine.  HOSPITAL COURSE: 1. Seizures.  The patient was noted to have seizures in the ED on     admission.  She was admitted and intubated for airway protection     secondary to her seizures.  She was loaded with IV Dilantin and     Neurology consultation was obtained.  The patient was seen in     consultation by Dr. Marjory [name] on December 30, 2010 where it was     recommended to continue patient on Dilantin.  MRI of the brain was     also done with results as stated above, which however did show a     small subarachnoid bleed at the vertex on the left and as such     Coumadin was held and discontinued during this hospitalization.     She will follow up with cardiologist as outpatient to see whether     patient is still a candidate for her Coumadin.  The patient was     monitored.  The patient did not have any further seizures during     the hospitalization.  An EEG was done with results as stated above.     The patient was monitored and followed.  The patient was     subsequently extubated on December 31, 2010 and was on the Critical     Care Service.  The patient was subsequently transferred to the     floor and transferred to the Hospitalist Service on January 01, 2011.     The patient remained in stable condition.  Her IV Dilantin was     subsequently  transitioned to oral Dilantin at 100 mg 3 times daily.     The patient was monitored and followed, did not have any further     seizures and she will be discharged to skilled nursing facility and     will need to follow up with [facility] 1     month post discharge for followup on her seizures and the small     subarachnoid blood noted on the vertex on the left.  The patient     will be discharged in a stable and improved condition. 2. Fall.  On admission, the patient was noted to come in with falls     and on presentation was complaining of right upper extremity pain.     X-rays which were done were consistent with the right proximal     humeral fracture.  X-ray of the left foot was also consistent with     the nondisplaced fracture of the necks of the left third and fourth     metatarsals.  The patient was seen in consultation by Dr. Myrtie [name] on December 30, 2010 at which time it was felt that patient     needed to immobilize the right upper extremity with a sling, and it     was felt that the patient will benefit from a fracture shoe on the     left side of left foot as well.  It was felt no intervention was     needed and patient will benefit from PT, OT and to follow up with     Dr. Montez Morita 2 weeks' post discharge.  The patient will be discharged     in a stable and improved condition.  The patient's fall was felt to  be secondary to seizures.  A syncope workup was undertaken with     carotid Dopplers, 2D echo with results as stated above, which were     negative.  The patient was seen by PT, OT.  The patient will be     discharged in a stable condition. 3. Acute renal failure.  On admission, the patient was noted to be in     acute renal failure.  It was felt to be likely prerenal azotemia.     The patient's diuretics were held.  The patient was partially     hydrated with some IV fluids.  The patient's renal function     improved and acute renal  failure had resolved as of day of     discharge.  The patient will be discharged in stable and improved     condition. 4. Right humeral fracture/left nondisplaced foot fracture.  These were     noted on x-ray, felt to be secondary to patient's fall.  The     patient was seen by Orthopedics, Dr. Myrtie [name] on December 30, 2010     at which point in time it was felt that no intervention was needed.     The patient needed to immobilize the right upper extremity with a     sling and also to place a fracture shoe on her left foot and to     follow up with Orthopedics as outpatient. 5. Hypertension.  During the hospitalization, the patient was noted to     be hypertensive.  She was resumed back on her home regimen.     However, her blood pressure was still elevated in the 160s-170s.     Norvasc 5 mg daily was added to her regimen.  She will be     discharged home on Norvasc, lisinopril and Bystolic.  Her blood     pressure would need to be reassessed and these medications could be     further uptitrated for better blood pressure control. 6. The rest of patient's chronic medical issues have remained stable     and patient will be discharged in a stable and improved condition.  VITAL SIGNS ON DAY OF DISCHARGE:  Temperature 98.9, pulse of 80, respirations 18, blood pressure 150/68, satting 95% on room air.  It has been a pleasure taking care of [employer].     Alexandra Harvest, MD     DT/MEDQ  D:  01/05/2011  T:  01/05/2011  Job:  [id number]  cc:   Alexandra [name], M.D. Fax: [phone]  [facility]  Vesta Mixer, M.D. Fax: [phone]  Alexandra Neither, MD Fax: [phone]  Electronically Signed by Alexandra Harvest MD on 01/05/2011 08:13:46 PM

## 2011-01-05 NOTE — Consult Note (Signed)
  NAMEHILLIARY, JOCK NO.:  [id number]  MEDICAL RECORD NO.:  [id number]  LOCATION:  1232                         FACILITY:  [facility]  PHYSICIAN:  Myrtie Neither, MD      DATE OF BIRTH:  [birth year]  DATE OF CONSULTATION: DATE OF DISCHARGE:  12/30/2010                                CONSULTATION   REFERRING PHYSICIAN:  Pollyann Savoy, MD.  REASON FOR CONSULTATION:  Fractured right humerus.  PERTINENT HISTORY:  This 70 year old who had sustained 2 falls in the past week, etiology unknown.  The patient this time fallen at home. Following the fall with bruise about the left facial area and agitated. The patient was admitted for repeat falls, right humerus fracture and new onset chronic seizure as noted in the emergency room department. Acute respiratory failure secondary to neurologic process.  Patient's physical findings are that of a cervical collar, ecchymosis about the left orifice, healing laceration about the left eyebrow, intubated but responsive to verbal commands. Right shoulder tender, ecchymotic, presently in restraints but not tied down.  Left foot family noted was black and blue about the fifth toe. Nurse also states that she knows that this morning examination of the left foot ecchymotic area of the left fifth toe and web space, no palpable crepitus.  Skin is intact. X-ray revealed right humeral head fracture, some displacement but well apposition.  IMPRESSION: 1. Multiple fall, fracture at right proximal humerus. 2. Contused left fifth toe, rule out fracture.  We will x-ray left     foot. 3. New onset seizure.  PLAN: 1. Immobilizing sling right arm. 2. X-ray of left foot to rule out fractured metatarsal of phalanx of     the fifth toe. Will follow.     Myrtie Neither, MD     AC/MEDQ  D:  12/30/2010  T:  12/31/2010  Job:  [id number]  Electronically Signed by Myrtie Neither MD on 01/05/2011 12:27:44 PM

## 2011-01-06 LAB — CULTURE, BLOOD (ROUTINE X 2): Culture: NO GROWTH

## 2011-01-28 ENCOUNTER — Other Ambulatory Visit: Payer: Self-pay | Admitting: Orthopedic Surgery

## 2011-01-28 ENCOUNTER — Ambulatory Visit
Admission: RE | Admit: 2011-01-28 | Discharge: 2011-01-28 | Disposition: A | Discharge status: Home / Self Care | Payer: PRIVATE HEALTH INSURANCE | Source: Ambulatory Visit | Attending: Orthopedic Surgery | Admitting: Orthopedic Surgery

## 2011-01-28 DIAGNOSIS — T148XXA Other injury of unspecified body region, initial encounter: Secondary | ICD-10-CM

## 2011-02-03 ENCOUNTER — Telehealth: Payer: Self-pay | Admitting: Cardiovascular Disease

## 2011-02-03 ENCOUNTER — Encounter: Payer: Self-pay | Admitting: Cardiovascular Disease

## 2011-02-03 ENCOUNTER — Telehealth: Payer: Self-pay | Admitting: *Deleted

## 2011-02-03 ENCOUNTER — Ambulatory Visit (INDEPENDENT_AMBULATORY_CARE_PROVIDER_SITE_OTHER): Payer: PRIVATE HEALTH INSURANCE | Admitting: Cardiovascular Disease

## 2011-02-03 VITALS — BP 136/70 | HR 68 | Ht 63.0 in | Wt 191.0 lb

## 2011-02-03 DIAGNOSIS — I4891 Unspecified atrial fibrillation: Secondary | ICD-10-CM

## 2011-02-03 MED ORDER — AMLODIPINE BESYLATE 5 MG PO TABS: 5.0000 mg | ORAL_TABLET | Freq: Two times a day (BID) | ORAL | Status: DC

## 2011-02-03 MED ORDER — NEBIVOLOL HCL 20 MG PO TABS: 20.0000 mg | ORAL_TABLET | Freq: Every day | ORAL | Status: DC

## 2011-02-03 NOTE — Telephone Encounter (Signed)
Called because she needed

## 2011-02-03 NOTE — Telephone Encounter (Signed)
Pt confirmed norvasc dose as prescribed.

## 2011-02-03 NOTE — Telephone Encounter (Signed)
Agree that she shouldn't take both mevacor and simvastatin.  I favorite generic statin is actually Lipator and I would favor Lipator.    Simvastatin is stronger than mevacor and I would favor simvastatin because of that .  She may take simvastatin  Until it is gone, then lets change to lipator.  i would like her to call when the simvastatin is almost gone and let us know the mg and we well prescribe lipator at that time.

## 2011-02-03 NOTE — Assessment & Plan Note (Signed)
She remains in normal sinus rhythm. At this point I think it is too dangerous for her to start Coumadin. I would favor starting her on aspirin if this is okay with neurology. I will leave the final decision of that up to the neurologist.  She's to continue with her same medications for now

## 2011-02-03 NOTE — Telephone Encounter (Signed)
PLEASE ADVISE: pt been on Mevacor a long time, while in hospital they placed her on Zocor and was dc 'd with it and is now taking both, pt told not to take Zocor tonight and wait till I contact her tomorrow for update. Alfonso Ramus RN

## 2011-02-03 NOTE — Telephone Encounter (Signed)
Pharm called back and said Norvasc is bid, pt was then called msg left to call back to confirm dose. Alfonso Ramus RN

## 2011-02-03 NOTE — Progress Notes (Signed)
Alexandra [name] Date of Birth  [birth year] [facility] / [facility] [address]     [address] [place], [place]  [address] [phone]  Fax  [phone]  History of Present Illness:  70 year old female with a history of coronary artery disease and coronary artery bypass grafting. She has a history of atrial fibrillation.  She's not had any episodes of chest pain or shortness breath.  She was recently hospitalized after falling. She also was thought to have some seizure activity. She was found to have a subarachnoid hemorrhage likely due to traumatic falls. She was thought to have seizures during her hospitalization.   She doesn't recall having any seizure activity. She apparently has been having some episodes that were diagnosis seizures. She we followed up with the neurologist for that.   Current Outpatient Prescriptions on File Prior to Visit  Medication Sig Dispense Refill  . insulin aspart (NOVOLOG) 100 UNIT/ML injection Inject into the skin 3 (three) times daily before meals. As directed       . insulin glargine (LANTUS) 100 UNIT/ML injection Inject into the skin. Taking 50 units in the morning and 35 units in the evening.      Marland Kitchen lisinopril (PRINIVIL,ZESTRIL) 10 MG tablet Take 1 tablet (10 mg total) by mouth 2 (two) times daily.  180 tablet  3  . lovastatin (MEVACOR) 40 MG tablet Take 1 tablet (40 mg total) by mouth daily.  90 tablet  3  . nebivolol (BYSTOLIC) 10 MG tablet Take 2 tablets (20 mg total) by mouth daily.  90 tablet  1  . pantoprazole (PROTONIX) 40 MG tablet Take 40 mg by mouth daily.        . sertraline (ZOLOFT) 100 MG tablet Take 100 mg by mouth daily.        . Vitamin D, Ergocalciferol, (DRISDOL) 50000 UNITS CAPS Take 50,000 Units by mouth every 7 (seven) days.        Marland Kitchen aspirin 81 MG tablet Take 81 mg by mouth daily. (ON HOLD)      . Warfarin Sodium (COUMADIN PO) Take by mouth. (ON HOLD)        Allergies  Allergen Reactions  . Sulfa  Antibiotics     Past Medical History  Diagnosis Date  . HTN (hypertension)   . CAD (coronary artery disease)     Remote CABG in 2008  . DM (diabetes mellitus)   . HTN (hypertension)   . Atrial fibrillation   . GERD (gastroesophageal reflux disease)   . Breast cancer   . Venous insufficiency   . Left bundle branch block   . Obesity   . Chronic anticoagulation     Past Surgical History  Procedure Date  . Partial hysterectomy     1985  . Total abdominal hysterectomy 1986  . Coronary artery bypass graft 2008    x3 2008.  [place] (Dr. Edrick Oh) [facility] [phone]  . Cholecystectomy     History  Smoking status  . Never Smoker   Smokeless tobacco  . Never Used    History  Alcohol Use No    Family History  Problem Relation Age of Onset  . Emphysema Father   . Hypertension Father   . Hypertension Mother   . Stroke Mother   . Diabetes Mother   . Diabetes Brother     Reviw of Systems:  Reviewed in the HPI.  All other systems are negative.  Physical Exam: BP 136/70  Pulse 68  Ht 5\' 3"  (1.6 m)  Wt 191 lb (86.637 kg)  BMI 33.83 kg/m2 The patient is alert and oriented x 3.  The mood and affect are normal.   Skin: warm and dry.  Color is normal.    HEENT:   the sclera are nonicteric.  The mucous membranes are moist.  The carotids are 2+ without bruits.  There is no thyromegaly.  There is no JVD.    Lungs: clear.  The chest wall is non tender.    Heart: regular rate with a normal S1 and S2.  There are no murmurs, gallops, or rubs. The PMI is not displaced.     Abdomen: good bowel sounds.  There is no guarding or rebound.  There is no hepatosplenomegaly or tenderness.  There are no masses.   Extremities:  no clubbing, cyanosis, or edema.  The legs are without rashes.  The distal pulses are intact.   Neuro:  Cranial nerves II - XII are intact.  Motor and sensory functions are intact.    The gait is normal.  ECG:  Assessment / Plan:

## 2011-02-03 NOTE — Telephone Encounter (Signed)
Called needing dosing clarification for her Norvast prescription that was sent in this morning. Please call back.

## 2011-02-04 ENCOUNTER — Telehealth: Payer: Self-pay | Admitting: Cardiovascular Disease

## 2011-02-04 DIAGNOSIS — E785 Hyperlipidemia, unspecified: Secondary | ICD-10-CM

## 2011-02-04 NOTE — Telephone Encounter (Signed)
Pt will finish zocor then if labs abnormal dr will switch to lipitor, pt told to stop mevacor, finish zocor, get labs, Pt verbalized understanding. Alfonso Ramus RN

## 2011-02-04 NOTE — Telephone Encounter (Signed)
Pt returning call from Jodette.

## 2011-02-04 NOTE — Telephone Encounter (Signed)
Pt called and will call back to discuss meds and need to order fasting labs.

## 2011-02-09 ENCOUNTER — Telehealth: Payer: Self-pay | Admitting: Cardiovascular Disease

## 2011-02-09 NOTE — Consult Note (Signed)
NAMESHAE, HINNENKAMP NO.:  [id number]  MEDICAL RECORD NO.:  [id number]  LOCATION:                                 FACILITY:  PHYSICIAN:  Joycelyn Schmid, MD   DATE OF BIRTH:  [birth year]  DATE OF CONSULTATION:  12/30/2010 DATE OF DISCHARGE:                                CONSULTATION   CHIEF COMPLAINT:  Seizure.  HISTORY OF PRESENT ILLNESS:  A 70 year old female with history of breast cancer, atrial fibrillation, hypertension, hyperlipidemia, diabetes, coronary artery disease with recent hospital evaluation on July 23rd for a fall, was found to be confused on the phone while talking to her son on July 25th.  Apparently, the son heard the patient fall down over the telephone.  She was brought to the emergency room complaining of right upper extremity pain.  In the emergency room, the patient was noted to have a generalized convulsive seizure with hypoxia and therefore the patient was intubated for respiratory failure.  She was loaded with Dilantin 1200 mg IV and intubated.  She was admitted to the critical care service.  Since admission, the patient has had no further seizures. She has been febrile.  PAST MEDICAL HISTORY: 1. Breast cancer, status post XRT and lumpectomy. 2. Atrial fibrillation, on Coumadin. 3. Hypertension. 4. Hyperlipidemia. 5. Diabetes. 6. Coronary artery disease.  PAST SURGICAL HISTORY:  Cholecystectomy, total abdominal hysterectomy and bilateral salpingo-oophorectomy, lumpectomy.  FAMILY HISTORY:  Diabetes, coronary artery disease.  SOCIAL HISTORY:  Denies tobacco, alcohol, or illicit drug use.  She has 7 children.  MEDICATIONS:  In the hospital, 1. Lopressor. 2. Protonix. 3. Coumadin. 4. Dilantin 200 mg IV twice a day.  REVIEW OF SYSTEMS:  As per the HPI.  The patient is extubated, unable to provide full review of systems.  PHYSICAL EXAMINATION:  VITAL SIGNS:  T-max 102, blood pressure 153/58, respirations 18, 100% on  30% FIO2, heart rate 81. GENERAL:  The patient is awake, alert, intubated.  She is able to follow commands.  She shows me her left thumb and shows me 2 fingers on the right to command.  Able to stick her tongue out to command.  She has bilateral periorbital ecchymosis.  She laceration over left eye.  Her right arm is in an arm sling. CRANIAL NERVES:  Pupils react from 2-1 mm.  Visual fields full to confrontation.  Extraocular muscles intact.  She has slightly decreased left upper facial weakness, but eyebrow raise and mild left ptosis. Facial sensation and strength otherwise symmetric and tongue midline. MOTOR:  Right upper extremity strength limited due to right arm sling. She is able to move her fingers distally to command.  Left upper extremity full strength.  Bilateral lower extremities 4/5. SENSORY:  Intact to light touch and pinprick.  Reflexes, right upper extremity cannot be tested due to right arm sling, left upper extremity 1, knees 2, ankles trace, downgoing toes.  Coordination testing, fine finger movements are rapid on the left hand.  CARDIOVASCULAR:  Regular rate and rhythm.  No murmurs.  No carotid bruits.  LABORATORY TESTING:  White count 7.7, platelets 99.  Sodium 139, BUN 21, creatinine 1.24, glucose 241.  CK 195, troponin  less than 0.3.  INR is 1.47.  CT scan of the head which I reviewed shows no acute findings. EEG preliminary report shows theta range slowing, no epileptiform discharges or seizures.  ASSESSMENT AND RECOMMENDATIONS:  A 70 year old female with new-onset seizure.  One witnessed seizure in the emergency room.  She may have had a second seizure at home.  RECOMMENDATIONS:  Continue Dilantin, check MRI brain with and without contrast, and MRA head when stable.     Joycelyn Schmid, MD     VP/MEDQ  D:  12/30/2010  T:  12/30/2010  Job:  [id number]  Electronically Signed by Joycelyn Schmid  on 02/09/2011 06:51:56 PM

## 2011-02-09 NOTE — Telephone Encounter (Signed)
Pt to take one tab am and pm.Pt verbalized understanding. Alfonso Ramus RN

## 2011-02-09 NOTE — Telephone Encounter (Signed)
Pt is calling to confirm medication dosage for her amlodipine 5mg .   Please call pt back .

## 2011-02-11 ENCOUNTER — Telehealth: Payer: Self-pay | Admitting: Cardiovascular Disease

## 2011-02-14 ENCOUNTER — Other Ambulatory Visit: Payer: Self-pay | Admitting: *Deleted

## 2011-02-14 ENCOUNTER — Other Ambulatory Visit (INDEPENDENT_AMBULATORY_CARE_PROVIDER_SITE_OTHER): Payer: PRIVATE HEALTH INSURANCE | Admitting: *Deleted

## 2011-02-14 DIAGNOSIS — E785 Hyperlipidemia, unspecified: Secondary | ICD-10-CM

## 2011-02-14 LAB — HEPATIC FUNCTION PANEL
Albumin: 3.5 g/dL (ref 3.5–5.2)
Alkaline Phosphatase: 132 U/L — ABNORMAL HIGH (ref 39–117)
Total Protein: 6.5 g/dL (ref 6.0–8.3)

## 2011-02-16 ENCOUNTER — Telehealth: Payer: Self-pay | Admitting: *Deleted

## 2011-02-16 LAB — BASIC METABOLIC PANEL
CO2: 25 mEq/L (ref 19–32)
Chloride: 108 mEq/L (ref 96–112)
Potassium: 4.5 mEq/L (ref 3.5–5.1)
Sodium: 142 mEq/L (ref 135–145)

## 2011-02-16 LAB — LIPID PANEL
HDL: 44.7 mg/dL (ref >39.00)
LDL Cholesterol: 54 mg/dL (ref 0–99)
Total CHOL/HDL Ratio: 3
Triglycerides: 130 mg/dL (ref 0.0–149.0)
VLDL: 26 mg/dL (ref 0.0–40.0)

## 2011-02-16 NOTE — Telephone Encounter (Signed)
Called Madlyn Frankel phlebotomist, inquiring on pts labs, hepatic completed but no results for bmet and lipids. He will call lab and get back with me if cant do test.

## 2011-02-16 NOTE — Telephone Encounter (Signed)
Lab called, pt was drawn and some orders were missed, will see if it can be added to current draw. jodette

## 2011-02-17 ENCOUNTER — Other Ambulatory Visit: Payer: PRIVATE HEALTH INSURANCE | Admitting: *Deleted

## 2011-02-18 ENCOUNTER — Telehealth: Payer: Self-pay | Admitting: Cardiovascular Disease

## 2011-02-18 NOTE — Telephone Encounter (Signed)
Pt states she got a call from the office and she thinks it's about her blood work.  Please call pt back to discuss.

## 2011-02-22 ENCOUNTER — Other Ambulatory Visit: Payer: Self-pay | Admitting: Orthopedic Surgery

## 2011-02-22 ENCOUNTER — Ambulatory Visit
Admission: RE | Admit: 2011-02-22 | Discharge: 2011-02-22 | Disposition: A | Discharge status: Home / Self Care | Payer: PRIVATE HEALTH INSURANCE | Source: Ambulatory Visit | Attending: Orthopedic Surgery | Admitting: Orthopedic Surgery

## 2011-02-22 DIAGNOSIS — T148XXA Other injury of unspecified body region, initial encounter: Secondary | ICD-10-CM

## 2011-02-27 ENCOUNTER — Emergency Department (HOSPITAL_COMMUNITY): Payer: PRIVATE HEALTH INSURANCE

## 2011-02-27 ENCOUNTER — Inpatient Hospital Stay (HOSPITAL_COMMUNITY)
Admission: EM | Admit: 2011-02-27 | Discharge: 2011-03-04 | DRG: 026 | Disposition: A | Discharge status: Rehabilitation Facility | Payer: PRIVATE HEALTH INSURANCE | Source: Ambulatory Visit | Attending: Neurosurgery | Admitting: Neurosurgery

## 2011-02-27 ENCOUNTER — Emergency Department (HOSPITAL_COMMUNITY)
Admission: EM | Admit: 2011-02-27 | Discharge: 2011-02-27 | Disposition: A | Discharge status: Other Acute Inpatient Hospital | Payer: PRIVATE HEALTH INSURANCE | Source: Home / Self Care | Attending: Emergency Medicine | Admitting: Emergency Medicine

## 2011-02-27 DIAGNOSIS — K219 Gastro-esophageal reflux disease without esophagitis: Secondary | ICD-10-CM | POA: Diagnosis present

## 2011-02-27 DIAGNOSIS — B373 Candidiasis of vulva and vagina: Secondary | ICD-10-CM | POA: Diagnosis not present

## 2011-02-27 DIAGNOSIS — B3731 Acute candidiasis of vulva and vagina: Secondary | ICD-10-CM | POA: Diagnosis not present

## 2011-02-27 DIAGNOSIS — S065X9A Traumatic subdural hemorrhage with loss of consciousness of unspecified duration, initial encounter: Principal | ICD-10-CM | POA: Diagnosis present

## 2011-02-27 DIAGNOSIS — I4891 Unspecified atrial fibrillation: Secondary | ICD-10-CM | POA: Diagnosis present

## 2011-02-27 DIAGNOSIS — Z9181 History of falling: Secondary | ICD-10-CM

## 2011-02-27 DIAGNOSIS — G40909 Epilepsy, unspecified, not intractable, without status epilepticus: Secondary | ICD-10-CM | POA: Diagnosis present

## 2011-02-27 DIAGNOSIS — Z853 Personal history of malignant neoplasm of breast: Secondary | ICD-10-CM

## 2011-02-27 DIAGNOSIS — Z951 Presence of aortocoronary bypass graft: Secondary | ICD-10-CM

## 2011-02-27 DIAGNOSIS — S065XAA Traumatic subdural hemorrhage with loss of consciousness status unknown, initial encounter: Principal | ICD-10-CM | POA: Diagnosis present

## 2011-02-27 DIAGNOSIS — E119 Type 2 diabetes mellitus without complications: Secondary | ICD-10-CM | POA: Diagnosis present

## 2011-02-27 DIAGNOSIS — F329 Major depressive disorder, single episode, unspecified: Secondary | ICD-10-CM | POA: Diagnosis present

## 2011-02-27 DIAGNOSIS — Z794 Long term (current) use of insulin: Secondary | ICD-10-CM

## 2011-02-27 DIAGNOSIS — Z882 Allergy status to sulfonamides status: Secondary | ICD-10-CM

## 2011-02-27 DIAGNOSIS — W19XXXA Unspecified fall, initial encounter: Secondary | ICD-10-CM | POA: Diagnosis present

## 2011-02-27 DIAGNOSIS — F3289 Other specified depressive episodes: Secondary | ICD-10-CM | POA: Diagnosis present

## 2011-02-27 DIAGNOSIS — R339 Retention of urine, unspecified: Secondary | ICD-10-CM | POA: Diagnosis not present

## 2011-02-27 DIAGNOSIS — Z833 Family history of diabetes mellitus: Secondary | ICD-10-CM

## 2011-02-27 DIAGNOSIS — R51 Headache: Secondary | ICD-10-CM | POA: Diagnosis present

## 2011-02-27 DIAGNOSIS — Z79899 Other long term (current) drug therapy: Secondary | ICD-10-CM

## 2011-02-27 DIAGNOSIS — E785 Hyperlipidemia, unspecified: Secondary | ICD-10-CM | POA: Diagnosis present

## 2011-02-27 DIAGNOSIS — I10 Essential (primary) hypertension: Secondary | ICD-10-CM | POA: Diagnosis present

## 2011-02-27 DIAGNOSIS — D62 Acute posthemorrhagic anemia: Secondary | ICD-10-CM | POA: Diagnosis not present

## 2011-02-27 DIAGNOSIS — I2510 Atherosclerotic heart disease of native coronary artery without angina pectoris: Secondary | ICD-10-CM | POA: Diagnosis present

## 2011-02-27 DIAGNOSIS — Z8781 Personal history of (healed) traumatic fracture: Secondary | ICD-10-CM

## 2011-02-27 LAB — COMPREHENSIVE METABOLIC PANEL
Alkaline Phosphatase: 143 U/L — ABNORMAL HIGH (ref 39–117)
BUN: 24 mg/dL — ABNORMAL HIGH (ref 6–23)
CO2: 27 mEq/L (ref 19–32)
Chloride: 101 mEq/L (ref 96–112)
Creatinine, Ser: 1.22 mg/dL — ABNORMAL HIGH (ref 0.50–1.10)
GFR calc Af Amer: 53 mL/min — ABNORMAL LOW (ref >60)
GFR calc non Af Amer: 44 mL/min — ABNORMAL LOW (ref >60)
Glucose, Bld: 95 mg/dL (ref 70–99)
Total Bilirubin: 0.3 mg/dL (ref 0.3–1.2)

## 2011-02-27 LAB — CBC
HCT: 34.3 % — ABNORMAL LOW (ref 36.0–46.0)
Hemoglobin: 12.3 g/dL (ref 12.0–15.0)
MCV: 90 fL (ref 78.0–100.0)
RBC: 3.81 MIL/uL — ABNORMAL LOW (ref 3.87–5.11)
RDW: 12.6 % (ref 11.5–15.5)
WBC: 6.4 10*3/uL (ref 4.0–10.5)

## 2011-02-27 LAB — GLUCOSE, CAPILLARY
Glucose-Capillary: 83 mg/dL (ref 70–99)
Glucose-Capillary: 134 mg/dL — ABNORMAL HIGH (ref 70–99)

## 2011-02-27 LAB — URINALYSIS, ROUTINE W REFLEX MICROSCOPIC
Bilirubin Urine: NEGATIVE
Glucose, UA: 100 mg/dL — AB
Hgb urine dipstick: NEGATIVE
Protein, ur: NEGATIVE mg/dL
Urobilinogen, UA: 0.2 mg/dL (ref 0.0–1.0)

## 2011-02-27 LAB — PROTIME-INR
INR: 1.03 (ref 0.00–1.49)
Prothrombin Time: 13.7 seconds (ref 11.6–15.2)

## 2011-02-27 LAB — DIFFERENTIAL
Basophils Absolute: 0 10*3/uL (ref 0.0–0.1)
Eosinophils Absolute: 0.2 10*3/uL (ref 0.0–0.7)
Eosinophils Relative: 3 % (ref 0–5)
Lymphs Abs: 1.5 10*3/uL (ref 0.7–4.0)

## 2011-02-27 LAB — PHENYTOIN LEVEL, TOTAL: Phenytoin Lvl: 2.6 ug/mL — ABNORMAL LOW (ref 10.0–20.0)

## 2011-02-28 LAB — SURGICAL PCR SCREEN
MRSA, PCR: NEGATIVE
Staphylococcus aureus: POSITIVE — AB

## 2011-02-28 LAB — GLUCOSE, CAPILLARY
Glucose-Capillary: 165 mg/dL — ABNORMAL HIGH (ref 70–99)
Glucose-Capillary: 158 mg/dL — ABNORMAL HIGH (ref 70–99)
Glucose-Capillary: 179 mg/dL — ABNORMAL HIGH (ref 70–99)
Glucose-Capillary: 230 mg/dL — ABNORMAL HIGH (ref 70–99)

## 2011-02-28 LAB — URINE CULTURE
Colony Count: 8000
Culture  Setup Time: 201209240028

## 2011-02-28 LAB — APTT: aPTT: 32 seconds (ref 24–37)

## 2011-03-01 ENCOUNTER — Inpatient Hospital Stay (HOSPITAL_COMMUNITY): Payer: PRIVATE HEALTH INSURANCE

## 2011-03-01 LAB — CBC
HCT: 27.7 % — ABNORMAL LOW (ref 36.0–46.0)
Hemoglobin: 9.7 g/dL — ABNORMAL LOW (ref 12.0–15.0)
MCH: 31.7 pg (ref 26.0–34.0)
MCHC: 35 g/dL (ref 30.0–36.0)
MCV: 90.5 fL (ref 78.0–100.0)
RBC: 3.06 MIL/uL — ABNORMAL LOW (ref 3.87–5.11)

## 2011-03-01 LAB — URINALYSIS, ROUTINE W REFLEX MICROSCOPIC
Bilirubin Urine: NEGATIVE
Glucose, UA: 250 mg/dL — AB
Hgb urine dipstick: NEGATIVE
Specific Gravity, Urine: 1.016 (ref 1.005–1.030)
pH: 5 (ref 5.0–8.0)

## 2011-03-01 LAB — GLUCOSE, CAPILLARY
Glucose-Capillary: 221 mg/dL — ABNORMAL HIGH (ref 70–99)
Glucose-Capillary: 283 mg/dL — ABNORMAL HIGH (ref 70–99)
Glucose-Capillary: 327 mg/dL — ABNORMAL HIGH (ref 70–99)

## 2011-03-01 LAB — BASIC METABOLIC PANEL
BUN: 16 mg/dL (ref 6–23)
CO2: 24 mEq/L (ref 19–32)
Calcium: 8.4 mg/dL (ref 8.4–10.5)
Creatinine, Ser: 0.95 mg/dL (ref 0.50–1.10)
GFR calc non Af Amer: 58 mL/min — ABNORMAL LOW (ref >60)
Glucose, Bld: 199 mg/dL — ABNORMAL HIGH (ref 70–99)
Sodium: 140 mEq/L (ref 135–145)

## 2011-03-02 LAB — URINE CULTURE

## 2011-03-02 LAB — GLUCOSE, CAPILLARY
Glucose-Capillary: 222 mg/dL — ABNORMAL HIGH (ref 70–99)
Glucose-Capillary: 212 mg/dL — ABNORMAL HIGH (ref 70–99)

## 2011-03-03 DIAGNOSIS — S065X9A Traumatic subdural hemorrhage with loss of consciousness of unspecified duration, initial encounter: Secondary | ICD-10-CM

## 2011-03-03 LAB — CBC
Hemoglobin: 9.8 g/dL — ABNORMAL LOW (ref 12.0–15.0)
MCHC: 36.4 g/dL — ABNORMAL HIGH (ref 30.0–36.0)
RBC: 3 MIL/uL — ABNORMAL LOW (ref 3.87–5.11)

## 2011-03-03 LAB — COMPREHENSIVE METABOLIC PANEL
ALT: 10 U/L (ref 0–35)
Alkaline Phosphatase: 92 U/L (ref 39–117)
GFR calc Af Amer: >60 mL/min (ref >60)
Glucose, Bld: 157 mg/dL — ABNORMAL HIGH (ref 70–99)
Potassium: 4 mEq/L (ref 3.5–5.1)
Sodium: 140 mEq/L (ref 135–145)
Total Protein: 6 g/dL (ref 6.0–8.3)

## 2011-03-03 LAB — GLUCOSE, CAPILLARY: Glucose-Capillary: 165 mg/dL — ABNORMAL HIGH (ref 70–99)

## 2011-03-03 NOTE — Consult Note (Signed)
Alexandra [name], CERCONE NO.:  [id number]  MEDICAL RECORD NO.:  [id number]  LOCATION:  WLED                         FACILITY:  [facility]  PHYSICIAN:  Cristi Loron, M.D.DATE OF BIRTH:  03/13/1941  DATE OF CONSULTATION:  02/27/2011 DATE OF DISCHARGE:                                CONSULTATION   CHIEF COMPLAINT:  Headaches, unsteadiness, weakness.  HISTORY OF PRESENT ILLNESS:  The patient is a 70 year old white female who had some falls back in July.  On one occasion, she stuck her face and scalp and had some lacerations, which were sutured.  The next day, she fell and fractured her wrist.  At that time, the patient had a CT scan, which was reportedly negative.  The patient was seen by the neurologist and she suddenly had a brain MRI, which demonstrated a small convexity, subarachnoid hemorrhage.  Based on that time was on Coumadin for atrial fibrillation and this was discontinued.  The patient also had a seizure and was treated with Dilantin and has been followed by Dr. Anne Hahn.  The patient was discharged and over the last week or so, has developed progressive "staggering," bilateral leg weakness, unsteadiness, and some headaches.  She has not had any more seizures.  The patient came to [facility] Department where she was evaluated by Dr. Donnetta Hutching to include a head CT scan, which demonstrated bilateral subacute subdural hematomas.  A neurosurgical consultation was requested.  Presently, the patient placed above information that she alert, pleasant, does not appear ill.  PAST MEDICAL HISTORY:  Positive for: 1. Coronary artery disease. 2. Coronary artery bypass graft. 3. Hypertension. 4. Seizure disorder. 5. Diabetes mellitus. 6. Depression. 7. Anemia. 8. Right humerus fracture. 9. Traumatic subarachnoid hemorrhage. 10.Atrial fibrillation. 11.Remote history of cholecystitis. 12.Hypercholesteremia. 13.Gastroesophageal reflux  disease. 14.Cataracts.  PAST SURGICAL HISTORY:  Coronary artery bypass graft, hysterectomy, cholecystectomy, appendectomy, and cataract surgery.  MEDICATIONS PRIOR TO ADMISSION: 1. P.r.n. Tylenol. 2. Hydrocodone. 3. Lasix 40 mg p.o. daily. 4. Bystolic 20 mg p.o. daily. 5. Amlodipine 5 mg p.o. daily. 6. Lisinopril 20 mg p.o. daily. 7. Dilantin 100 mg p.o. t.i.d. 8. Valium 5 mg b.i.d. p.r.n. 9. Lantus insulin 20-50 units subcu sliding scale. 10.Zoloft 100 mg p.o. daily. 11.Vitamin D 50,000 units p.o. weekly. 12.Protonix 40 mg p.o. daily. 13.Simvastatin 40 mg p.o. daily.  FAMILY MEDICAL HISTORY:  Noncontributory.  SOCIAL HISTORY:  The patient is a widow.  She has seven children.  She lives in [place].  She is not employed.  She denies tobacco, ethanol drug use.  REVIEW OF SYSTEMS:  Negative for except as above.  PHYSICAL EXAMINATION:  GENERAL:  A pleasant 70 year old white female in no apparent distress. HEENT:  Normocephalic, atraumatic. HEENT:  Normocephalic and atraumatic.  Her pupils are equal, round, and reactive to light.  She has bilateral lens implants.  Extraocularmuscles are intact.  Oropharynx benign.  She wear dentures. NECK:  Supple without masses or deformities.  She has a normal range of motion for age.  Spurling's testing is negative.  Lhermitte sign was not present.  Thorax is symmetric. ABDOMEN:  Soft. EXTREMITIES:  No obvious deformities. NEUROLOGIC:  The patient is alert and  oriented x3.  Glasgow coma scale 15.  Cranial nerves II through XII were examined bilaterally, grossly normal.  Vision and hearing grossly normal bilaterally.  Motor strength is 5/5 in bilateral biceps, triceps, handgrip, quadriceps, gastrocnemius, dorsiflexors.  Deep tendon reflexes are symmetric. Sensory function is intact to light touch and sensation to all tested dermatomes bilaterally.  Cerebellar function is intact to rapid alternating movements of the upper extremities  bilaterally.  Imaging studies are reviewed.  The patient's head scan performed without contrast at [facility] today.  It demonstrates the patient has bilateral subacute subdural hematomas.  There is some mass effect predominantly on the left frontal lobe.  There is no midline shift.  I do not see any skull fractures.  ASSESSMENT AND PLAN:  Bilateral subdural hematomas.  I have discussed the situation with the patient.  These were not present on MRI scan about 2 months ago.  I have discussed the situation with the patient. We have discussed the various treatments including observation versus bilateral craniotomies for evacuate the subdural hematomas.  I favor ladder, I described that surgery to her.  We discussed the risks of surgery including risk of anesthesia, hemorrhage, infection, seizures, stroke, recurrent subdural hematoma, medical risk, etc.  I have answered all the patient's questions.  She would like to proceed with surgery.  I am going to transfer her to [facility] and plan to it tentatively tomorrow.  Multiple medical issues.  The patient is going to be transferred and admitted by the Hospitalist Service given all the patient's medical issues.     Cristi Loron, M.D.     JDJ/MEDQ  D:  02/27/2011  T:  02/27/2011  Job:  [id number]  Electronically Signed by Tressie Stalker M.D. on 03/03/2011 09:43:19 AM

## 2011-03-03 NOTE — Op Note (Signed)
NAMEDEANNAH, Alexandra [name] NO.:  [id number]  MEDICAL RECORD NO.:  [id number]  LOCATION:  3113                         FACILITY:  [facility]  PHYSICIAN:  Cristi Loron, M.D.DATE OF BIRTH:  04/27/1941  DATE OF PROCEDURE:  02/28/2011 DATE OF DISCHARGE:                              OPERATIVE REPORT   BRIEF HISTORY:  The patient is a 70 year old white female who has suffered a fall several months ago.  She has had progressive headaches, weakness,  difficulty with ambulation, etc.  She presented to the emergency department, was worked up with a head CT, which demonstrated she had bilateral subdural hematomas.  I discussed the various treatment options with the patient, and I recommended she undergo surgery.  The patient has weighed the risks, benefits, and alternatives of surgery, and decided to proceed with the operation.  PREOPERATIVE DIAGNOSIS:  Bilateral subacute subdural hematomas.  POSTOPERATIVE DIAGNOSIS:  Bilateral subacute subdural hematomas.  PROCEDURE:  Bilateral craniotomy (through separate incisions) for evacuation of subdural hematomas.  SURGEON:  Cristi Loron, MD  ASSISTANT:  Danae Orleans. Venetia Maxon, MD  ANESTHESIA:  General endotracheal.  ESTIMATED BLOOD LOSS:  100 mL.  SPECIMENS:  None.  DRAINS:  None.  COMPLICATIONS:  None.  DESCRIPTION OF PROCEDURE:  The patient was brought to the operating room by the Anesthesia Team.  General endotracheal anesthesia was induced. The patient's calvarium was supported in the Mayfield three-point headrest.  The patient remained in the supine position facing up towards the ceiling.  The patient's temporal and frontal scalp were shaved with clippers and then prepared with Betadine scrub and Betadine solution. Sterile drapes were applied.  I then injected the area to be incised with Marcaine with epinephrine solution.  I used a scalpel to make a linear incision in the frontal region bilaterally.  I used Raney  clips for wound edge hemostasis.  We used a Horticulturist, commercial for exposure. I used a periosteal elevator to expose the underlying calvarium bilaterally.  I then used the high-speed drill to create bilateral frontal bur holes.  I used a footplate plate device to create a bilateral tubal frontoparietal craniotomy flaps.  I elevated the flaps with Penfield #3 exposing the underlying dura.  I incised the dura with a scalpel and encountered the subdural membranes.  I used bipolar electrocautery to pierce the subdural membrane and then irrigated the subdural space with bacitracin and saline solution.  On both sides, I encountered deep subdural membrane just superficial to brain.  I incised this and communicated it with a larger subdural space.  We irrigated the subdural space out until the irrigant came back crystal clear.  We did not see any active bleeding points.  Of note, this procedure was done bilaterally.  We then reapproximated the patient's dura with 4-0 Nurolon suture then laid a piece of Gelfoam over the extended dura, and then reapproximated the patient's craniotomy flaps bilaterally with titanium mini plates and screws.  We then removed the retractors and reapproximated the patient's galea with interrupted 2-0 Vicryl suture and then skin with interrupted stainless steel staples.  The wound was irrigated with bacitracin solution and coated with bacitracin ointment and sterile dressing was applied.  The drapes were removed, and the Mayfield three-point headrest was removed from the patient's calvarium. The patient was then extubated by Anesthesia Team and transported to postanesthesia care unit in stable condition.  All sponge, instrument, and needle counts were correct at the end of this case.     Cristi Loron, M.D.     JDJ/MEDQ  D:  02/28/2011  T:  03/01/2011  Job:  [id number]  Electronically Signed by Tressie Stalker M.D. on 03/03/2011 09:43:27 AM

## 2011-03-04 ENCOUNTER — Inpatient Hospital Stay (HOSPITAL_COMMUNITY)
Admission: RE | Admit: 2011-03-04 | Discharge: 2011-03-14 | DRG: 945 | Disposition: A | Discharge status: Home Health | Payer: PRIVATE HEALTH INSURANCE | Source: Other Acute Inpatient Hospital | Attending: Physical Medicine & Rehabilitation | Admitting: Physical Medicine & Rehabilitation

## 2011-03-04 DIAGNOSIS — G40909 Epilepsy, unspecified, not intractable, without status epilepticus: Secondary | ICD-10-CM | POA: Diagnosis present

## 2011-03-04 DIAGNOSIS — Z79899 Other long term (current) drug therapy: Secondary | ICD-10-CM

## 2011-03-04 DIAGNOSIS — S065X9A Traumatic subdural hemorrhage with loss of consciousness of unspecified duration, initial encounter: Secondary | ICD-10-CM

## 2011-03-04 DIAGNOSIS — I10 Essential (primary) hypertension: Secondary | ICD-10-CM | POA: Diagnosis present

## 2011-03-04 DIAGNOSIS — Z833 Family history of diabetes mellitus: Secondary | ICD-10-CM

## 2011-03-04 DIAGNOSIS — R569 Unspecified convulsions: Secondary | ICD-10-CM

## 2011-03-04 DIAGNOSIS — Z853 Personal history of malignant neoplasm of breast: Secondary | ICD-10-CM

## 2011-03-04 DIAGNOSIS — Z5189 Encounter for other specified aftercare: Secondary | ICD-10-CM

## 2011-03-04 DIAGNOSIS — Z8781 Personal history of (healed) traumatic fracture: Secondary | ICD-10-CM

## 2011-03-04 DIAGNOSIS — K219 Gastro-esophageal reflux disease without esophagitis: Secondary | ICD-10-CM | POA: Diagnosis present

## 2011-03-04 DIAGNOSIS — Z794 Long term (current) use of insulin: Secondary | ICD-10-CM

## 2011-03-04 DIAGNOSIS — F3289 Other specified depressive episodes: Secondary | ICD-10-CM | POA: Diagnosis present

## 2011-03-04 DIAGNOSIS — E119 Type 2 diabetes mellitus without complications: Secondary | ICD-10-CM | POA: Diagnosis present

## 2011-03-04 DIAGNOSIS — Z882 Allergy status to sulfonamides status: Secondary | ICD-10-CM

## 2011-03-04 DIAGNOSIS — S065XAA Traumatic subdural hemorrhage with loss of consciousness status unknown, initial encounter: Secondary | ICD-10-CM | POA: Diagnosis present

## 2011-03-04 DIAGNOSIS — R51 Headache: Secondary | ICD-10-CM | POA: Diagnosis present

## 2011-03-04 DIAGNOSIS — Z9861 Coronary angioplasty status: Secondary | ICD-10-CM

## 2011-03-04 DIAGNOSIS — Z9181 History of falling: Secondary | ICD-10-CM

## 2011-03-04 DIAGNOSIS — I2510 Atherosclerotic heart disease of native coronary artery without angina pectoris: Secondary | ICD-10-CM | POA: Diagnosis present

## 2011-03-04 DIAGNOSIS — E785 Hyperlipidemia, unspecified: Secondary | ICD-10-CM | POA: Diagnosis present

## 2011-03-04 DIAGNOSIS — G8190 Hemiplegia, unspecified affecting unspecified side: Secondary | ICD-10-CM | POA: Diagnosis present

## 2011-03-04 DIAGNOSIS — I4891 Unspecified atrial fibrillation: Secondary | ICD-10-CM | POA: Diagnosis present

## 2011-03-04 DIAGNOSIS — F329 Major depressive disorder, single episode, unspecified: Secondary | ICD-10-CM | POA: Diagnosis present

## 2011-03-04 DIAGNOSIS — E1165 Type 2 diabetes mellitus with hyperglycemia: Secondary | ICD-10-CM

## 2011-03-04 LAB — GLUCOSE, CAPILLARY
Glucose-Capillary: 193 mg/dL — ABNORMAL HIGH (ref 70–99)
Glucose-Capillary: 85 mg/dL (ref 70–99)
Glucose-Capillary: 103 mg/dL — ABNORMAL HIGH (ref 70–99)
Glucose-Capillary: 137 mg/dL — ABNORMAL HIGH (ref 70–99)

## 2011-03-05 DIAGNOSIS — S065X9A Traumatic subdural hemorrhage with loss of consciousness of unspecified duration, initial encounter: Secondary | ICD-10-CM

## 2011-03-05 DIAGNOSIS — E1165 Type 2 diabetes mellitus with hyperglycemia: Secondary | ICD-10-CM

## 2011-03-05 DIAGNOSIS — R569 Unspecified convulsions: Secondary | ICD-10-CM

## 2011-03-05 LAB — GLUCOSE, CAPILLARY: Glucose-Capillary: 191 mg/dL — ABNORMAL HIGH (ref 70–99)

## 2011-03-06 LAB — URINE MICROSCOPIC-ADD ON

## 2011-03-06 LAB — GLUCOSE, CAPILLARY
Glucose-Capillary: 121 mg/dL — ABNORMAL HIGH (ref 70–99)
Glucose-Capillary: 266 mg/dL — ABNORMAL HIGH (ref 70–99)

## 2011-03-06 LAB — URINALYSIS, ROUTINE W REFLEX MICROSCOPIC
Glucose, UA: NEGATIVE mg/dL
Protein, ur: NEGATIVE mg/dL
pH: 6.5 (ref 5.0–8.0)

## 2011-03-07 DIAGNOSIS — R569 Unspecified convulsions: Secondary | ICD-10-CM

## 2011-03-07 DIAGNOSIS — S065XAA Traumatic subdural hemorrhage with loss of consciousness status unknown, initial encounter: Secondary | ICD-10-CM

## 2011-03-07 DIAGNOSIS — E1165 Type 2 diabetes mellitus with hyperglycemia: Secondary | ICD-10-CM

## 2011-03-07 DIAGNOSIS — S065X9A Traumatic subdural hemorrhage with loss of consciousness of unspecified duration, initial encounter: Secondary | ICD-10-CM

## 2011-03-07 DIAGNOSIS — IMO0001 Reserved for inherently not codable concepts without codable children: Secondary | ICD-10-CM

## 2011-03-07 LAB — DIFFERENTIAL
Basophils Absolute: 0 10*3/uL (ref 0.0–0.1)
Basophils Relative: 0 % (ref 0–1)
Eosinophils Absolute: 0.1 10*3/uL (ref 0.0–0.7)
Lymphs Abs: 0.7 10*3/uL (ref 0.7–4.0)
Neutrophils Relative %: 62 % (ref 43–77)

## 2011-03-07 LAB — COMPREHENSIVE METABOLIC PANEL
ALT: 17 U/L (ref 0–35)
Albumin: 2.8 g/dL — ABNORMAL LOW (ref 3.5–5.2)
Alkaline Phosphatase: 93 U/L (ref 39–117)
Calcium: 9.3 mg/dL (ref 8.4–10.5)
Potassium: 4 mEq/L (ref 3.5–5.1)
Sodium: 135 mEq/L (ref 135–145)
Total Protein: 6.2 g/dL (ref 6.0–8.3)

## 2011-03-07 LAB — GLUCOSE, CAPILLARY
Glucose-Capillary: 222 mg/dL — ABNORMAL HIGH (ref 70–99)
Glucose-Capillary: 260 mg/dL — ABNORMAL HIGH (ref 70–99)

## 2011-03-07 LAB — CBC
Platelets: 139 10*3/uL — ABNORMAL LOW (ref 150–400)
RBC: 3.07 MIL/uL — ABNORMAL LOW (ref 3.87–5.11)
RDW: 12.3 % (ref 11.5–15.5)
WBC: 3.1 10*3/uL — ABNORMAL LOW (ref 4.0–10.5)

## 2011-03-07 LAB — PHENYTOIN LEVEL, TOTAL: Phenytoin Lvl: <2.5 ug/mL — ABNORMAL LOW (ref 10.0–20.0)

## 2011-03-08 DIAGNOSIS — S065X9A Traumatic subdural hemorrhage with loss of consciousness of unspecified duration, initial encounter: Secondary | ICD-10-CM

## 2011-03-08 DIAGNOSIS — R569 Unspecified convulsions: Secondary | ICD-10-CM

## 2011-03-08 DIAGNOSIS — E1165 Type 2 diabetes mellitus with hyperglycemia: Secondary | ICD-10-CM

## 2011-03-08 LAB — GLUCOSE, CAPILLARY
Glucose-Capillary: 225 mg/dL — ABNORMAL HIGH (ref 70–99)
Glucose-Capillary: 235 mg/dL — ABNORMAL HIGH (ref 70–99)

## 2011-03-08 LAB — CULTURE, BLOOD (ROUTINE X 2): Culture: NO GROWTH

## 2011-03-09 DIAGNOSIS — S065XAA Traumatic subdural hemorrhage with loss of consciousness status unknown, initial encounter: Secondary | ICD-10-CM

## 2011-03-09 DIAGNOSIS — E1165 Type 2 diabetes mellitus with hyperglycemia: Secondary | ICD-10-CM

## 2011-03-09 DIAGNOSIS — S065X9A Traumatic subdural hemorrhage with loss of consciousness of unspecified duration, initial encounter: Secondary | ICD-10-CM

## 2011-03-09 DIAGNOSIS — IMO0001 Reserved for inherently not codable concepts without codable children: Secondary | ICD-10-CM

## 2011-03-09 DIAGNOSIS — R569 Unspecified convulsions: Secondary | ICD-10-CM

## 2011-03-09 LAB — GLUCOSE, CAPILLARY
Glucose-Capillary: 182 mg/dL — ABNORMAL HIGH (ref 70–99)
Glucose-Capillary: 187 mg/dL — ABNORMAL HIGH (ref 70–99)

## 2011-03-09 LAB — URINE CULTURE

## 2011-03-10 LAB — GLUCOSE, CAPILLARY
Glucose-Capillary: 76 mg/dL (ref 70–99)
Glucose-Capillary: 243 mg/dL — ABNORMAL HIGH (ref 70–99)
Glucose-Capillary: 280 mg/dL — ABNORMAL HIGH (ref 70–99)
Glucose-Capillary: 182 mg/dL — ABNORMAL HIGH (ref 70–99)

## 2011-03-11 DIAGNOSIS — E1165 Type 2 diabetes mellitus with hyperglycemia: Secondary | ICD-10-CM

## 2011-03-11 DIAGNOSIS — S065X9A Traumatic subdural hemorrhage with loss of consciousness of unspecified duration, initial encounter: Secondary | ICD-10-CM

## 2011-03-11 DIAGNOSIS — R569 Unspecified convulsions: Secondary | ICD-10-CM

## 2011-03-11 LAB — GLUCOSE, CAPILLARY
Glucose-Capillary: 62 mg/dL — ABNORMAL LOW (ref 70–99)
Glucose-Capillary: 181 mg/dL — ABNORMAL HIGH (ref 70–99)
Glucose-Capillary: 80 mg/dL (ref 70–99)

## 2011-03-12 LAB — GLUCOSE, CAPILLARY
Glucose-Capillary: 104 mg/dL — ABNORMAL HIGH (ref 70–99)
Glucose-Capillary: 221 mg/dL — ABNORMAL HIGH (ref 70–99)

## 2011-03-12 LAB — CULTURE, BLOOD (ROUTINE X 2)
Culture  Setup Time: 201209260039
Culture: NO GROWTH

## 2011-03-13 LAB — GLUCOSE, CAPILLARY
Glucose-Capillary: 62 mg/dL — ABNORMAL LOW (ref 70–99)
Glucose-Capillary: 155 mg/dL — ABNORMAL HIGH (ref 70–99)

## 2011-03-13 NOTE — H&P (Addendum)
NAMECACI, Alexandra NO.:  [name]  MEDICAL RECORD NO.:  [id number]  LOCATION:  3004                         FACILITY:  [facility]  PHYSICIAN:  Thomasenia Bottoms, MDDATE OF BIRTH:  05-22-1941  DATE OF ADMISSION:  02/27/2011 DATE OF DISCHARGE:                             HISTORY & PHYSICAL   CHIEF COMPLAINT:  Weakness.  HISTORY OF PRESENT ILLNESS:  Alexandra [name] is a very pleasant 70 year old woman who presents to the hospital with weakness, which has been getting worse over the last week.  She says she had trouble walking today, which is new for her.  The patient is independent and this worried her.  Her only other complaint is a headache, but she has had that for several weeks and it is unchanged.  Her past medical history is extensive.  She was hospitalized on July 25th to August 1st with a fall, new-onset seizures, head trauma, and respiratory failure resulting from the seizure.  She was taken care of by the critical care team.  She was intubated for a short time.  She had an MRI which revealed a small subarachnoid bleed, which was felt to be secondary to the head trauma from the fall.  The patient was on Coumadin up until that time, it was discontinued.  She did very well and was discharged to follow up.  The patient has a history of coronary artery disease and bypass surgery. She has a history of atrial fibrillation, but she says she last had atrial fibrillation prior to that surgery, has not had it since; history of breast cancer and is status post XRT and lumpectomy; history of hypertension; hyperlipidemia; diabetes mellitus type 2; coronary artery disease, as mentioned above; history of TAH and BSO; history of cholecystectomy.  She sustained a right fibula and right humerus fracture from the fall secondary to the seizures in August, but has been stable since.  She did have an echocardiogram in July 2012 when she was hospitalized, it was technically  limited but did reveal an EF of 60%.  MEDICATIONS ON ARRIVAL: 1. Protonix 40 mg p.o. daily. 2. Simvastatin 40 mg p.o. daily. 3. Furosemide 40 mg p.o. daily. 4. Bystolic 20 mg p.o. daily. 5. Amlodipine 5 mg p.o. daily. 6. Lisinopril 20 mg p.o. daily. 7. Dilantin 100 mg p.o. 3 times daily, this was started during her     last hospital stay in July-August. 8. Lantus 50 units subcutaneous every morning and 30 units     subcutaneous at night. 9. NovoLog 3 times daily per sliding scale. 10.Zoloft 100 mg p.o. daily 11.Vitamin D 50,000 units p.o. weekly.  SOCIAL HISTORY:  She does not smoke cigarettes, drink alcohol, or use any illicit drugs.  The patient lives here with her brother.  FAMILY HISTORY:  Significant for hypertension and diabetes.  REVIEW OF SYSTEMS:  The patient has had trouble with the headache for weeks since she was discharge from the hospital, it is not worsening, it is unchanged.  The weakness, however, is new.  She is having difficulty walking, but she denies any other lateralizing symptoms.  No chest pain, no shortness of breath, no fever, no diarrhea, no constipation.  All  other systems reviewed and are negative.  PHYSICAL EXAMINATION:  VITAL SIGNS:  Temperature 99.3, blood pressure was 147/67, pulse 67, respiratory rate 16, O2 sats 95% on room air. GENERAL:  The patient is well nourished, well developed.  She is pleasant and cooperative. HEENT:  She is normocephalic, atraumatic.  Her pupils are glassy. Sclerae nonicteric.  Oral mucosa moist. NECK:  Supple.  No lymphadenopathy, no thyromegaly, no jugular venous distention. CARDIAC:  Regular rate and rhythm.  No murmurs, gallops, or rubs appreciated. LUNGS:  Clear to auscultation bilaterally.  Excellent breath sounds.  No wheezes, rhonchi, or rales. ABDOMEN:  Soft, nontender, nondistended.  Normoactive bowel sounds.  No hepatosplenomegaly.  No rebound or guarding.  EXTREMITIES:  No evidence of clubbing,  cyanosis, or pitting edema.  She has palpable DP pulses bilaterally. SKIN:  Warm, dry, and intact.  No open lesions or rashes. MUSCULOSKELETAL:  No evidence of effusion of her joints. NEUROLOGIC:  She is awake and alert.  She is attentive and appropriate. Normal affect.  Cranial nerves II through XII are intact grossly.  She moves each of her extremities to command, but her formal motor, strength, and sensory examination is deferred to the neurosurgeon.  The patient's nurse does tell me she does require significant assistance to get up to go to the bathroom.  LABORATORY DATA:  The patient's urinalysis reveals cloudy urine, negative nitrites, large leukocyte esterase.  Her chest x-ray reveals an old right rib fracture, but no acute finding.  Her white count is 6.4, hemoglobin 12.3, hematocrit 34.3, platelet count is 164.  Sodium is 138, potassium 3.7, chloride 101, bicarb is 27, glucose 95, BUN 24, creatinine 1.22, AST 17, ALT 14, alk phos is 143.  Her EKG reveals normal sinus rhythm at a rate of 70.  She has a left bundle-branch block.  The head CT reveals moderate-to-large subdural hematomas bilaterally with significant mass effect on the brain with effacement of the subarachnoid space, as seen on previous studies.  The patient's INR is 1.03, PT is 13.7.  Dilantin level is 2.6, which is low.  ASSESSMENT AND PLAN: 1. Large bilateral subdural hematomas.  I have discussed this case     with Dr. Lovell Sheehan.  He has already seen the patient.  She will be     having surgery in the morning, likely.  He has requested that the     hospitalist admit the patient, though he will be managing her     hematomas.  He is recommending a neuro bed, but not ICU or step-     down.  So, we will defer all of this management to the     neurosurgeon. 2. Diabetes mellitus.  The patient is on a large amount of insulin,     though she is type 2 diabetic.  Her blood sugars are in the 80s at     this time.  In  light of this, we are going to hold her Lantus until     after surgery tomorrow.  When she is taking p.o., we will monitor     her sugars carefully. 3. History of seizures, likely related to her previous head trauma and     bleeding.  We will also defer this to the neurosurgeon.  For now,     we will discontinue her Dilantin, though her level is     subtherapeutic. 4. Atrial fibrillation.  The patient is in normal sinus rhythm.  She     has not been  in atrial fibrillation for years per her report.     Certainly, she will continue to not be on Coumadin.  Her heart rate     is within normal limits.  We will follow. 5. Hypertension.  The patient is on 3 antihypertensives.  We will     continue her beta-blocker and her amlodipine.  We will hold her ACE     inhibitor until after the surgery. 6. Gastroesophageal reflux disease.  We will continue her Protonix. 7. Hyperlipidemia.  We will continue her simvastatin. 8. Depression.  We will continue her sertraline and diazepam as     needed.  She will likely be transferred to the neurosurgeon's service after she has her surgery, but the hospitalists are happy to manage her medical problems.     Thomasenia Bottoms, MD     CVC/MEDQ  D:  02/27/2011  T:  02/28/2011  Job:  [id number]  cc:   Marjory Lies, M.D. Fax: [phone]  Electronically Signed by Buena Irish MD on 03/13/2011 05:39:31 PM

## 2011-03-14 DIAGNOSIS — R569 Unspecified convulsions: Secondary | ICD-10-CM

## 2011-03-14 DIAGNOSIS — E1165 Type 2 diabetes mellitus with hyperglycemia: Secondary | ICD-10-CM

## 2011-03-14 DIAGNOSIS — S065X9A Traumatic subdural hemorrhage with loss of consciousness of unspecified duration, initial encounter: Secondary | ICD-10-CM

## 2011-03-14 LAB — GLUCOSE, CAPILLARY
Glucose-Capillary: 140 mg/dL — ABNORMAL HIGH (ref 70–99)
Glucose-Capillary: 122 mg/dL — ABNORMAL HIGH (ref 70–99)

## 2011-03-21 ENCOUNTER — Inpatient Hospital Stay (HOSPITAL_COMMUNITY)
Admission: EM | Admit: 2011-03-21 | Discharge: 2011-04-06 | DRG: 023 | Disposition: A | Discharge status: Skilled Nursing Facility | Payer: PRIVATE HEALTH INSURANCE | Source: Ambulatory Visit | Attending: Family Medicine | Admitting: Family Medicine

## 2011-03-21 ENCOUNTER — Emergency Department (HOSPITAL_COMMUNITY): Payer: PRIVATE HEALTH INSURANCE

## 2011-03-21 DIAGNOSIS — I4891 Unspecified atrial fibrillation: Secondary | ICD-10-CM | POA: Diagnosis present

## 2011-03-21 DIAGNOSIS — Z923 Personal history of irradiation: Secondary | ICD-10-CM

## 2011-03-21 DIAGNOSIS — Z853 Personal history of malignant neoplasm of breast: Secondary | ICD-10-CM

## 2011-03-21 DIAGNOSIS — Z8249 Family history of ischemic heart disease and other diseases of the circulatory system: Secondary | ICD-10-CM

## 2011-03-21 DIAGNOSIS — Z79899 Other long term (current) drug therapy: Secondary | ICD-10-CM

## 2011-03-21 DIAGNOSIS — E7800 Pure hypercholesterolemia, unspecified: Secondary | ICD-10-CM | POA: Diagnosis present

## 2011-03-21 DIAGNOSIS — Z833 Family history of diabetes mellitus: Secondary | ICD-10-CM

## 2011-03-21 DIAGNOSIS — Z794 Long term (current) use of insulin: Secondary | ICD-10-CM

## 2011-03-21 DIAGNOSIS — I6200 Nontraumatic subdural hemorrhage, unspecified: Secondary | ICD-10-CM | POA: Diagnosis present

## 2011-03-21 DIAGNOSIS — Z882 Allergy status to sulfonamides status: Secondary | ICD-10-CM

## 2011-03-21 DIAGNOSIS — D649 Anemia, unspecified: Secondary | ICD-10-CM | POA: Diagnosis present

## 2011-03-21 DIAGNOSIS — G40802 Other epilepsy, not intractable, without status epilepticus: Secondary | ICD-10-CM | POA: Diagnosis present

## 2011-03-21 DIAGNOSIS — R29898 Other symptoms and signs involving the musculoskeletal system: Secondary | ICD-10-CM | POA: Diagnosis present

## 2011-03-21 DIAGNOSIS — E871 Hypo-osmolality and hyponatremia: Secondary | ICD-10-CM | POA: Diagnosis present

## 2011-03-21 DIAGNOSIS — G0481 Other encephalitis and encephalomyelitis: Principal | ICD-10-CM | POA: Diagnosis present

## 2011-03-21 DIAGNOSIS — E785 Hyperlipidemia, unspecified: Secondary | ICD-10-CM | POA: Diagnosis present

## 2011-03-21 DIAGNOSIS — E1169 Type 2 diabetes mellitus with other specified complication: Secondary | ICD-10-CM | POA: Diagnosis not present

## 2011-03-21 DIAGNOSIS — I10 Essential (primary) hypertension: Secondary | ICD-10-CM | POA: Diagnosis present

## 2011-03-21 DIAGNOSIS — I2510 Atherosclerotic heart disease of native coronary artery without angina pectoris: Secondary | ICD-10-CM | POA: Diagnosis present

## 2011-03-21 DIAGNOSIS — R4701 Aphasia: Secondary | ICD-10-CM | POA: Diagnosis present

## 2011-03-21 DIAGNOSIS — G936 Cerebral edema: Secondary | ICD-10-CM | POA: Diagnosis present

## 2011-03-21 DIAGNOSIS — K219 Gastro-esophageal reflux disease without esophagitis: Secondary | ICD-10-CM | POA: Diagnosis present

## 2011-03-21 DIAGNOSIS — F341 Dysthymic disorder: Secondary | ICD-10-CM | POA: Diagnosis present

## 2011-03-21 LAB — URINE MICROSCOPIC-ADD ON

## 2011-03-21 LAB — CBC
HCT: 30.1 % — ABNORMAL LOW (ref 36.0–46.0)
Hemoglobin: 10.8 g/dL — ABNORMAL LOW (ref 12.0–15.0)
MCH: 32 pg (ref 26.0–34.0)
MCHC: 35.9 g/dL (ref 30.0–36.0)
MCV: 89.1 fL (ref 78.0–100.0)
Platelets: 162 10*3/uL (ref 150–400)
RBC: 3.38 MIL/uL — ABNORMAL LOW (ref 3.87–5.11)
RDW: 12 % (ref 11.5–15.5)
WBC: 6.9 10*3/uL (ref 4.0–10.5)

## 2011-03-21 LAB — APTT: aPTT: 31 s (ref 24–37)

## 2011-03-21 LAB — DIFFERENTIAL
Basophils Absolute: 0 10*3/uL (ref 0.0–0.1)
Basophils Relative: 0 % (ref 0–1)
Eosinophils Absolute: 0 10*3/uL (ref 0.0–0.7)
Eosinophils Relative: 1 % (ref 0–5)
Lymphocytes Relative: 11 % — ABNORMAL LOW (ref 12–46)
Lymphs Abs: 0.7 K/uL (ref 0.7–4.0)
Monocytes Absolute: 0.3 10*3/uL (ref 0.1–1.0)
Monocytes Relative: 5 % (ref 3–12)
Neutro Abs: 5.8 K/uL (ref 1.7–7.7)
Neutrophils Relative %: 84 % — ABNORMAL HIGH (ref 43–77)

## 2011-03-21 LAB — URINALYSIS, ROUTINE W REFLEX MICROSCOPIC
Bilirubin Urine: NEGATIVE
Glucose, UA: NEGATIVE mg/dL
Hgb urine dipstick: NEGATIVE
Ketones, ur: NEGATIVE mg/dL
Nitrite: NEGATIVE
Protein, ur: NEGATIVE mg/dL
Specific Gravity, Urine: 1.019 (ref 1.005–1.030)
Urobilinogen, UA: 0.2 mg/dL (ref 0.0–1.0)
pH: 5 (ref 5.0–8.0)

## 2011-03-21 LAB — COMPREHENSIVE METABOLIC PANEL WITH GFR
ALT: 21 U/L (ref 0–35)
AST: 21 U/L (ref 0–37)
Alkaline Phosphatase: 106 U/L (ref 39–117)
CO2: 24 meq/L (ref 19–32)
GFR calc Af Amer: 40 mL/min — ABNORMAL LOW (ref >90)
GFR calc non Af Amer: 34 mL/min — ABNORMAL LOW (ref >90)
Glucose, Bld: 182 mg/dL — ABNORMAL HIGH (ref 70–99)
Potassium: 4.2 meq/L (ref 3.5–5.1)
Sodium: 133 meq/L — ABNORMAL LOW (ref 135–145)

## 2011-03-21 LAB — GLUCOSE, CAPILLARY
Glucose-Capillary: 192 mg/dL — ABNORMAL HIGH (ref 70–99)
Glucose-Capillary: 67 mg/dL — ABNORMAL LOW (ref 70–99)

## 2011-03-21 LAB — COMPREHENSIVE METABOLIC PANEL
Albumin: 3.2 g/dL — ABNORMAL LOW (ref 3.5–5.2)
BUN: 29 mg/dL — ABNORMAL HIGH (ref 6–23)
Calcium: 9.8 mg/dL (ref 8.4–10.5)
Chloride: 98 mEq/L (ref 96–112)
Creatinine, Ser: 1.5 mg/dL — ABNORMAL HIGH (ref 0.50–1.10)
Total Bilirubin: 0.2 mg/dL — ABNORMAL LOW (ref 0.3–1.2)
Total Protein: 7.1 g/dL (ref 6.0–8.3)

## 2011-03-21 LAB — PHENYTOIN LEVEL, TOTAL: Phenytoin Lvl: 2.5 ug/mL — ABNORMAL LOW (ref 10.0–20.0)

## 2011-03-21 LAB — PROTIME-INR
INR: 1.12 (ref 0.00–1.49)
Prothrombin Time: 14.6 seconds (ref 11.6–15.2)

## 2011-03-22 ENCOUNTER — Observation Stay (HOSPITAL_COMMUNITY): Payer: PRIVATE HEALTH INSURANCE

## 2011-03-22 LAB — RAPID URINE DRUG SCREEN, HOSP PERFORMED
Benzodiazepines: POSITIVE — AB
Cocaine: NOT DETECTED

## 2011-03-22 LAB — CBC
Hemoglobin: 10.3 g/dL — ABNORMAL LOW (ref 12.0–15.0)
MCH: 32.4 pg (ref 26.0–34.0)
MCV: 89.6 fL (ref 78.0–100.0)
RBC: 3.18 MIL/uL — ABNORMAL LOW (ref 3.87–5.11)

## 2011-03-22 LAB — BASIC METABOLIC PANEL
CO2: 25 mEq/L (ref 19–32)
Calcium: 9.3 mg/dL (ref 8.4–10.5)
Creatinine, Ser: 1.23 mg/dL — ABNORMAL HIGH (ref 0.50–1.10)
Glucose, Bld: 135 mg/dL — ABNORMAL HIGH (ref 70–99)

## 2011-03-22 LAB — GLUCOSE, CAPILLARY: Glucose-Capillary: 217 mg/dL — ABNORMAL HIGH (ref 70–99)

## 2011-03-22 MED ORDER — GADOBENATE DIMEGLUMINE 529 MG/ML IV SOLN
13.0000 mL | Freq: Once | INTRAVENOUS | Status: AC
  Administered 2011-03-22: 13 mL via INTRAVENOUS

## 2011-03-22 NOTE — Procedures (Signed)
EEG NUMBER:  REFERRING PHYSICIAN:  Dr. Rito Ehrlich.  HISTORY:  A 70 year old female with new onset seizures.  MEDICATIONS:  Keppra, Zestril, Lantus, Dilantin, Bystolic, Norvasc, Zocor, vitamin D, Zoloft, Zofran and Valium.  CONDITIONS OF RECORDING:  This is a 16-channel EEG carried out with the patient in the drowsy and asleep states.  During drowse, muscle and movement artifact are prominent.  Posterior background rhythm is difficult to evaluate, but at its max reaches an 8 Hz alpha activity.  A mixture of activities are noted in the central and temporal regions with faster activity is noted anteriorly at times superimposed on more posterior rhythms.  Stage II sleep is noted during the tracing as well. During stage II sleep, there is absence of muscle and movement artifact. The background activity is slow and poorly organized with superimposed symmetrical sleep spindles and vertex central sharp transients.  Also noted is frequent sharp activity with a left hemisphere with phase reversal at C3.  There are 2 episodes during the tracing with a sharp activity becomes periodic and continuous at a frequency of approximately 2 Hz.  With one episode, this lasts approximately 45 seconds.  With the next episode that lasted approximately 20 seconds.  Hypoventilation and intermittent photic stimulation were not performed.  IMPRESSION:  This is an abnormal EEG secondary to frequent sharp activity over the left hemisphere with phase reversal at C3.  Also noted was evidence of electric clinical seizure activity.  This finding is consistent with the patient's history of right-sided seizure activity.          ______________________________ Thana Farr, MD    ZO:XWRU D:  03/22/2011 17:51:09  T:  03/22/2011 21:15:04  Job #:  [id number]

## 2011-03-23 DIAGNOSIS — F411 Generalized anxiety disorder: Secondary | ICD-10-CM

## 2011-03-23 LAB — BASIC METABOLIC PANEL WITH GFR
BUN: 14 mg/dL (ref 6–23)
CO2: 24 meq/L (ref 19–32)
Calcium: 9 mg/dL (ref 8.4–10.5)
Chloride: 105 meq/L (ref 96–112)
Creatinine, Ser: 1.12 mg/dL — ABNORMAL HIGH (ref 0.50–1.10)
GFR calc Af Amer: 56 mL/min — ABNORMAL LOW
GFR calc non Af Amer: 49 mL/min — ABNORMAL LOW
Glucose, Bld: 155 mg/dL — ABNORMAL HIGH (ref 70–99)
Potassium: 3.6 meq/L (ref 3.5–5.1)
Sodium: 138 meq/L (ref 135–145)

## 2011-03-23 LAB — CBC
HCT: 24.7 % — ABNORMAL LOW (ref 36.0–46.0)
MCH: 31.8 pg (ref 26.0–34.0)
MCV: 90.1 fL (ref 78.0–100.0)
Platelets: 122 10*3/uL — ABNORMAL LOW (ref 150–400)
RBC: 2.74 MIL/uL — ABNORMAL LOW (ref 3.87–5.11)

## 2011-03-23 LAB — GLUCOSE, CAPILLARY
Glucose-Capillary: 146 mg/dL — ABNORMAL HIGH (ref 70–99)
Glucose-Capillary: 180 mg/dL — ABNORMAL HIGH (ref 70–99)
Glucose-Capillary: 147 mg/dL — ABNORMAL HIGH (ref 70–99)
Glucose-Capillary: 124 mg/dL — ABNORMAL HIGH (ref 70–99)
Glucose-Capillary: 182 mg/dL — ABNORMAL HIGH (ref 70–99)

## 2011-03-24 ENCOUNTER — Inpatient Hospital Stay (HOSPITAL_COMMUNITY): Payer: PRIVATE HEALTH INSURANCE

## 2011-03-24 ENCOUNTER — Other Ambulatory Visit (HOSPITAL_COMMUNITY): Payer: PRIVATE HEALTH INSURANCE

## 2011-03-24 LAB — DIFFERENTIAL
Basophils Absolute: 0 10*3/uL (ref 0.0–0.1)
Basophils Relative: 0 % (ref 0–1)
Eosinophils Absolute: 0.1 10*3/uL (ref 0.0–0.7)
Eosinophils Relative: 2 % (ref 0–5)
Monocytes Absolute: 0.3 10*3/uL (ref 0.1–1.0)

## 2011-03-24 LAB — BASIC METABOLIC PANEL
Chloride: 103 mEq/L (ref 96–112)
GFR calc Af Amer: 60 mL/min — ABNORMAL LOW (ref >90)
GFR calc non Af Amer: 52 mL/min — ABNORMAL LOW (ref >90)
Potassium: 3.7 mEq/L (ref 3.5–5.1)

## 2011-03-24 LAB — GLUCOSE, CAPILLARY
Glucose-Capillary: 126 mg/dL — ABNORMAL HIGH (ref 70–99)
Glucose-Capillary: 129 mg/dL — ABNORMAL HIGH (ref 70–99)
Glucose-Capillary: 211 mg/dL — ABNORMAL HIGH (ref 70–99)
Glucose-Capillary: 232 mg/dL — ABNORMAL HIGH (ref 70–99)
Glucose-Capillary: 57 mg/dL — ABNORMAL LOW (ref 70–99)

## 2011-03-24 LAB — CBC
HCT: 27 % — ABNORMAL LOW (ref 36.0–46.0)
Hemoglobin: 9.7 g/dL — ABNORMAL LOW (ref 12.0–15.0)
MCH: 32.3 pg (ref 26.0–34.0)
MCHC: 35.5 g/dL (ref 30.0–36.0)
MCHC: 35.9 g/dL (ref 30.0–36.0)
Platelets: 130 10*3/uL — ABNORMAL LOW (ref 150–400)
RDW: 12.1 % (ref 11.5–15.5)
RDW: 12.1 % (ref 11.5–15.5)
WBC: 4.5 10*3/uL (ref 4.0–10.5)

## 2011-03-24 LAB — URINALYSIS, ROUTINE W REFLEX MICROSCOPIC
Glucose, UA: NEGATIVE mg/dL
Hgb urine dipstick: NEGATIVE
Ketones, ur: NEGATIVE mg/dL
Leukocytes, UA: NEGATIVE
Protein, ur: NEGATIVE mg/dL
Urobilinogen, UA: 0.2 mg/dL (ref 0.0–1.0)

## 2011-03-25 ENCOUNTER — Inpatient Hospital Stay (HOSPITAL_COMMUNITY): Payer: PRIVATE HEALTH INSURANCE

## 2011-03-25 ENCOUNTER — Other Ambulatory Visit (HOSPITAL_COMMUNITY): Payer: PRIVATE HEALTH INSURANCE

## 2011-03-25 DIAGNOSIS — R569 Unspecified convulsions: Secondary | ICD-10-CM

## 2011-03-25 LAB — DIFFERENTIAL
Eosinophils Relative: 2 % (ref 0–5)
Lymphocytes Relative: 26 % (ref 12–46)
Monocytes Absolute: 0.3 10*3/uL (ref 0.1–1.0)
Monocytes Relative: 8 % (ref 3–12)
Neutro Abs: 2.9 10*3/uL (ref 1.7–7.7)

## 2011-03-25 LAB — CBC
HCT: 26.7 % — ABNORMAL LOW (ref 36.0–46.0)
Hemoglobin: 9.4 g/dL — ABNORMAL LOW (ref 12.0–15.0)
MCH: 31.9 pg (ref 26.0–34.0)
MCHC: 35.2 g/dL (ref 30.0–36.0)
MCV: 90.5 fL (ref 78.0–100.0)

## 2011-03-25 LAB — URINE CULTURE
Colony Count: NO GROWTH
Culture: NO GROWTH

## 2011-03-25 NOTE — Procedures (Signed)
EEG NUMBER:  [id number].  This routine EEG was requested in this 70 year old woman who has had a history of breakthrough seizures.  She had a bilateral subdural hematoma status post evacuation.  Medications include levetiracetam and Dilantin.  The EEG was done with the patient awake and drowsy.  During periods of maximal wakefulness, she had a 9 cycle per second posterior dominant rhythm that attenuated with eye opening and was symmetric.  Background activities were composed of low amplitude, moderately organized alpha and beta activities that were symmetric.  Note was made of frequent intermittent left hemisphere delta range slowing that seemed to have its maximum negativity in the frontal central regions.  Photic stimulation did not produce a driving response.  Hyperventilation was not performed.  The patient became drowsy as characterized by attenuation of muscle activity as well as the alpha rhythm.  There was the appearance of slower background activities in the theta and delta range.  The patient did not enter stage 2 sleep.  CLINICAL INTERPRETATION:  This routine EEG done with the patient awake and drowsy is abnormal.  The frequent intermittent delta activities over the left hemisphere, particularly in the left frontal central regions, suggest an underlying area of functional or structural abnormality.  No electrographic seizures, epileptiform discharges or nonconvulsive status epilepticus was seen.          ______________________________ Denton Meek, MD    VH:QION D:  03/25/2011 13:03:08  T:  03/25/2011 13:25:40  Job #:  [id number]

## 2011-03-25 NOTE — Consult Note (Signed)
Alexandra [name], EFFERTZ NO.:  [id number]  MEDICAL RECORD NO.:  [id number]  LOCATION:  [facility]                         FACILITY:  [facility]  PHYSICIAN:  Joycelyn Schmid, MD   DATE OF BIRTH:  1940/09/28  DATE OF CONSULTATION:  03/21/2011 DATE OF DISCHARGE:                                CONSULTATION   CONSULT TIME:  8:30 am.  CHIEF COMPLAINT:  Seizure, right-sided weakness, inability to speak.  HISTORY OF PRESENT ILLNESS:  A 70 year old female with history of hypertension, diabetes, atrial fibrillation, not on Coumadin, coronary artery disease, breast cancer, bilateral subdural hematoma status post evacuation, was in normal state of health at home until today around 2:15, when she was found to have convulsive movements.  Apparently, convulsions were on the right side of her body.  It lasted for approximately 45 minutes and continued upon arrival in the emergency room.  She was given Versed 5 mg with some slight improvement. Ultimately, convulsions stopped, the patient was sleepy, dysarthria, unable to speak correctly, and unable to move her right side.  Thus, see the patient for further evaluation.  On arrival at this time, the patient is alone in the examination room. She is not able to answer any of my questions.  She does make eye contact and follow commands.  Information for this evaluation obtained by reviewing the chart and discussed with the emergency room physician. Upon review initial EMS report vital signs mentioned once blood pressure 178/78, pulse of 90, respirations 12, O2 sat 98%.  Initial blood glucose 323.  PAST MEDICAL HISTORY: 1. Breast cancer status post XRT and lumpectomy 2. Atrial fibrillation, not on anticoagulation because of recent falls     and subdural hematoma. 3. Hypertension. 4. Hyperlipidemia. 5. Diabetes. 6. Coronary artery disease.  PAST SURGICAL HISTORY:  Cholecystectomy, total abdominal hysterectomy, bilateral salpingo  oophorectomy, lumpectomy, and bilateral craniotomy for subdural hematoma evacuation.  FAMILY HISTORY:  Diabetes, coronary artery disease.  SOCIAL HISTORY:  She lives with her family at home.  No tobacco, alcohol, or illicit drug use.  HOME MEDICATIONS:  Phenytoin 300 mg at bedtime, Lantus, amlodipine, simvastatin, Bystolic, pantoprazole, lisinopril, vitamin D2, sertraline, and temazepam.  ALLERGIES:  SULFA.  REVIEW OF SYSTEMS:  As per the HPI because the patient could not answer my questions.  Unable to obtain full review of systems.  PHYSICAL EXAMINATION:  VITAL SIGNS:  Temperature 101.6, blood pressure 139/72, heart rate 79, respirations 18, O2 sat 100% room air. GENERAL EXAMINATION:  She is asleep in the bed, she wakes to voice.  She has expressive aphasia, I am not able to understand any intelligible words.  She is able to follow some simple commands.  She is not able to read or name. CRANIAL NERVES:  Pupils reactive to 1 mm.  Extraocular muscles intact. Facial sensation and strength symmetric.  Tongue appears midline. Shoulders symmetric. MOTOR EXAMINATION:  No drift in the bilateral upper extremities.  Grip strength, slightly decreased in the right hand, otherwise motor strength upper extremities appears symmetric. Lower extremities, both legs drift to the ground and appears symmetric. SENSORY EXAMINATION:  She withdraws stimulation in all extremities. Coordination testing, she is not able to follow commands, but  does not appear to have ataxia.  Reflexes trace in the upper extremities, absent at the knees and ankles, downgoing toes. CARDIOVASCULAR EXAMINATION:  Regular rate and rhythm.  No murmurs, no carotid bruits.  LAB TESTING:  Dilantin level 2.5, sodium 133, BUN 29, creatinine 1.8, glucose 182, white count 6.9, platelets 162, INR 1.12.  UA shows 0-2 white cells, rare bacteria, hazy trace leukocyte esterase.  CT scan of the head which I reviewed shows evolving  postsurgical changes with left frontal pneumocephalus, bilateral craniotomies, interval reduction, and subdural hematoma, and pneumocephalus.  No early acute ischemic changes, no acute intracerebral hemorrhage.  ASSESSMENT AND PLAN:  A 70 year old female with history of atrial fibrillation, not on anticoagulation, hypertension, diabetes, bilateral subdural hematoma status post evacuation, now with focal right-sided seizure, followed by focal right-sided weakness and aphasia. Differential diagnosis include partial seizure with postictal Todd's paralysis versus left frontal ischemic infarction.  PROBLEMS LIST: 1. Seizure. 2. Aphasia. 3. Fever. 4. Hyponatremia. 5. Hyperglycemia. 6. Renal sufficiency.  RECOMMENDATIONS: 1. MRI of the brain with and without contrast. 2. Start levetiracetam 1000 mg twice a day. 3. Stop Dilantin.     Joycelyn Schmid, MD     VP/MEDQ  D:  03/21/2011  T:  03/21/2011  Job:  [id number]  Electronically Signed by Joycelyn Schmid  on 03/25/2011 05:47:30 PM

## 2011-03-26 DIAGNOSIS — R509 Fever, unspecified: Secondary | ICD-10-CM

## 2011-03-26 DIAGNOSIS — I6200 Nontraumatic subdural hemorrhage, unspecified: Secondary | ICD-10-CM

## 2011-03-26 DIAGNOSIS — M79609 Pain in unspecified limb: Secondary | ICD-10-CM

## 2011-03-26 LAB — PHENYTOIN LEVEL, TOTAL: Phenytoin Lvl: 7.9 ug/mL — ABNORMAL LOW (ref 10.0–20.0)

## 2011-03-26 LAB — GLUCOSE, CAPILLARY
Glucose-Capillary: 243 mg/dL — ABNORMAL HIGH (ref 70–99)
Glucose-Capillary: 81 mg/dL (ref 70–99)
Glucose-Capillary: 165 mg/dL — ABNORMAL HIGH (ref 70–99)
Glucose-Capillary: 201 mg/dL — ABNORMAL HIGH (ref 70–99)

## 2011-03-26 LAB — BASIC METABOLIC PANEL
BUN: 14 mg/dL (ref 6–23)
Chloride: 102 mEq/L (ref 96–112)
GFR calc Af Amer: 58 mL/min — ABNORMAL LOW (ref >90)
Potassium: 4 mEq/L (ref 3.5–5.1)

## 2011-03-26 LAB — COMPREHENSIVE METABOLIC PANEL
AST: 27 U/L (ref 0–37)
Albumin: 2.8 g/dL — ABNORMAL LOW (ref 3.5–5.2)
Calcium: 9.2 mg/dL (ref 8.4–10.5)
Creatinine, Ser: 1.07 mg/dL (ref 0.50–1.10)

## 2011-03-26 LAB — CBC
HCT: 28.1 % — ABNORMAL LOW (ref 36.0–46.0)
Hemoglobin: 10 g/dL — ABNORMAL LOW (ref 12.0–15.0)
MCH: 31.8 pg (ref 26.0–34.0)
MCV: 90.4 fL (ref 78.0–100.0)
Platelets: 125 10*3/uL — ABNORMAL LOW (ref 150–400)
RBC: 3.14 MIL/uL — ABNORMAL LOW (ref 3.87–5.11)
RDW: 12.1 % (ref 11.5–15.5)
WBC: 4.3 10*3/uL (ref 4.0–10.5)

## 2011-03-27 LAB — DIFFERENTIAL
Basophils Relative: 0 % (ref 0–1)
Eosinophils Relative: 3 % (ref 0–5)
Monocytes Relative: 9 % (ref 3–12)
Neutrophils Relative %: 65 % (ref 43–77)

## 2011-03-27 LAB — CBC
HCT: 25.8 % — ABNORMAL LOW (ref 36.0–46.0)
MCH: 32.3 pg (ref 26.0–34.0)
MCV: 89.6 fL (ref 78.0–100.0)
RBC: 2.88 MIL/uL — ABNORMAL LOW (ref 3.87–5.11)
WBC: 4.3 10*3/uL (ref 4.0–10.5)

## 2011-03-27 LAB — GLUCOSE, CAPILLARY
Glucose-Capillary: 197 mg/dL — ABNORMAL HIGH (ref 70–99)
Glucose-Capillary: 258 mg/dL — ABNORMAL HIGH (ref 70–99)

## 2011-03-28 ENCOUNTER — Inpatient Hospital Stay (HOSPITAL_COMMUNITY): Payer: PRIVATE HEALTH INSURANCE

## 2011-03-28 LAB — COMPREHENSIVE METABOLIC PANEL
AST: 18 U/L (ref 0–37)
AST: 20 U/L (ref 0–37)
Albumin: 3 g/dL — ABNORMAL LOW (ref 3.5–5.2)
CO2: 28 mEq/L (ref 19–32)
Calcium: 9.5 mg/dL (ref 8.4–10.5)
Calcium: 9.6 mg/dL (ref 8.4–10.5)
Creatinine, Ser: 1.17 mg/dL — ABNORMAL HIGH (ref 0.50–1.10)
Creatinine, Ser: 1.11 mg/dL — ABNORMAL HIGH (ref 0.50–1.10)
GFR calc Af Amer: 53 mL/min — ABNORMAL LOW (ref >90)
GFR calc non Af Amer: 46 mL/min — ABNORMAL LOW (ref >90)
Total Protein: 6.5 g/dL (ref 6.0–8.3)

## 2011-03-28 LAB — GLUCOSE, CAPILLARY
Glucose-Capillary: 222 mg/dL — ABNORMAL HIGH (ref 70–99)
Glucose-Capillary: 107 mg/dL — ABNORMAL HIGH (ref 70–99)
Glucose-Capillary: 202 mg/dL — ABNORMAL HIGH (ref 70–99)
Glucose-Capillary: 80 mg/dL (ref 70–99)

## 2011-03-28 LAB — CBC
Hemoglobin: 10.4 g/dL — ABNORMAL LOW (ref 12.0–15.0)
MCH: 32.4 pg (ref 26.0–34.0)
MCHC: 36 g/dL (ref 30.0–36.0)
Platelets: 139 10*3/uL — ABNORMAL LOW (ref 150–400)
RDW: 12 % (ref 11.5–15.5)

## 2011-03-28 MED ORDER — GADOBENATE DIMEGLUMINE 529 MG/ML IV SOLN
13.0000 mL | Freq: Once | INTRAVENOUS | Status: AC
  Administered 2011-03-28: 13 mL via INTRAVENOUS

## 2011-03-29 ENCOUNTER — Other Ambulatory Visit: Payer: Self-pay | Admitting: Diagnostic Radiology

## 2011-03-29 ENCOUNTER — Inpatient Hospital Stay (HOSPITAL_COMMUNITY): Payer: PRIVATE HEALTH INSURANCE

## 2011-03-29 LAB — CSF CELL COUNT WITH DIFFERENTIAL
Monocyte-Macrophage-Spinal Fluid: 15 % (ref 15–45)
Segmented Neutrophils-CSF: 34 % — ABNORMAL HIGH (ref 0–6)
WBC, CSF: 98 /mm3 (ref 0–5)

## 2011-03-29 LAB — CBC
HCT: 27.4 % — ABNORMAL LOW (ref 36.0–46.0)
MCH: 32 pg (ref 26.0–34.0)
MCV: 90.4 fL (ref 78.0–100.0)
Platelets: 120 10*3/uL — ABNORMAL LOW (ref 150–400)
RDW: 11.9 % (ref 11.5–15.5)

## 2011-03-29 LAB — GLUCOSE, CAPILLARY: Glucose-Capillary: 61 mg/dL — ABNORMAL LOW (ref 70–99)

## 2011-03-29 LAB — DIFFERENTIAL
Eosinophils Absolute: 0.1 10*3/uL (ref 0.0–0.7)
Eosinophils Relative: 2 % (ref 0–5)
Lymphocytes Relative: 22 % (ref 12–46)
Lymphs Abs: 1.2 10*3/uL (ref 0.7–4.0)
Monocytes Absolute: 0.5 10*3/uL (ref 0.1–1.0)
Monocytes Relative: 10 % (ref 3–12)

## 2011-03-29 LAB — COMPREHENSIVE METABOLIC PANEL
AST: 20 U/L (ref 0–37)
Albumin: 2.9 g/dL — ABNORMAL LOW (ref 3.5–5.2)
BUN: 20 mg/dL (ref 6–23)
Calcium: 9.4 mg/dL (ref 8.4–10.5)
Chloride: 97 mEq/L (ref 96–112)
Creatinine, Ser: 1.15 mg/dL — ABNORMAL HIGH (ref 0.50–1.10)
GFR calc non Af Amer: 47 mL/min — ABNORMAL LOW (ref >90)
Total Bilirubin: 0.2 mg/dL — ABNORMAL LOW (ref 0.3–1.2)

## 2011-03-29 LAB — PHENYTOIN LEVEL, TOTAL: Phenytoin Lvl: 7.4 ug/mL — ABNORMAL LOW (ref 10.0–20.0)

## 2011-03-30 LAB — CBC
HCT: 27.7 % — ABNORMAL LOW (ref 36.0–46.0)
MCH: 32.2 pg (ref 26.0–34.0)
MCHC: 35.4 g/dL (ref 30.0–36.0)
MCV: 91.1 fL (ref 78.0–100.0)
Platelets: 137 10*3/uL — ABNORMAL LOW (ref 150–400)
RDW: 11.9 % (ref 11.5–15.5)

## 2011-03-30 LAB — GLUCOSE, CAPILLARY
Glucose-Capillary: 78 mg/dL (ref 70–99)
Glucose-Capillary: 196 mg/dL — ABNORMAL HIGH (ref 70–99)
Glucose-Capillary: 179 mg/dL — ABNORMAL HIGH (ref 70–99)
Glucose-Capillary: 174 mg/dL — ABNORMAL HIGH (ref 70–99)
Glucose-Capillary: 183 mg/dL — ABNORMAL HIGH (ref 70–99)

## 2011-03-30 LAB — DIFFERENTIAL
Eosinophils Absolute: 0.1 10*3/uL (ref 0.0–0.7)
Eosinophils Relative: 3 % (ref 0–5)
Lymphocytes Relative: 27 % (ref 12–46)
Lymphs Abs: 1.2 10*3/uL (ref 0.7–4.0)
Monocytes Absolute: 0.4 10*3/uL (ref 0.1–1.0)

## 2011-03-30 LAB — BASIC METABOLIC PANEL
BUN: 19 mg/dL (ref 6–23)
Calcium: 9.3 mg/dL (ref 8.4–10.5)
Creatinine, Ser: 1.16 mg/dL — ABNORMAL HIGH (ref 0.50–1.10)
GFR calc Af Amer: 54 mL/min — ABNORMAL LOW (ref >90)
GFR calc non Af Amer: 47 mL/min — ABNORMAL LOW (ref >90)

## 2011-03-30 NOTE — Discharge Summary (Addendum)
  Alexandra [name], CLINKENBEARD NO.:  [id number]  MEDICAL RECORD NO.:  [id number]  LOCATION:                               FACILITY:  [facility]  PHYSICIAN:  Cristi Loron, M.D.DATE OF BIRTH:  10/13/1940  DATE OF ADMISSION:  02/27/2011 DATE OF DISCHARGE:                              DISCHARGE SUMMARY   BRIEF HISTORY:  The patient is a 70 year old white female who has suffered from a fall several months ago.  She has developed progressive headaches, weakness, and difficulty with ambulation, etc.  She presented to emergency apartment and was worked up with a head CT which demonstrates she had bilateral subdural hematomas.  I discussed various treatment with the patient and recommend she undergo surgery.  The patient has weighed the risks, benefits, and alternatives of surgery.  I pursued with the operation.  For further details of this admission, please refer to typed history and physical.  HOSPITAL COURSE:  Admitted the patient [facility] on February 28, 2011.  On day of admission, I performed a bilateral craniotomy for evacuation of subdural hematomas.  Surgery went well (for full details of this operation please refer to typed operative note).  POSTOPERATIVE COURSE:  The patient's postoperative course was unremarkable.  She did well in general.  The hospitalist mentioned medical issues.  The patient did develop bit of urinary retention, but subsequently had her urinary catheter discontinued.  The patient did complain of some topical yeast infection, we treated this with Diflucan. By March 04, 2011, the patient was afebrile, vitals were stable. She was neurologically doing well, and requesting transfer to a rehab unit.  She was therefore transferred to rehab unit on March 04, 2011.  DISCHARGE INSTRUCTIONS:  The patient is instructed to follow up with me in 1 week for staple removal.  She was given discharge instructions.  FINAL DIAGNOSES:   Bilateral subdural hematomas, urinary retention, diabetes mellitus, vaginal candidiasis, hypertension, seizure disorder, and depression.  PROCEDURE PERFORMED:  Bilateral craniotomy for subdural hematomas.     Cristi Loron, M.D.     JDJ/MEDQ  D:  03/13/2011  T:  03/14/2011  Job:  [id number]  Electronically Signed by Tressie Stalker M.D. on 03/30/2011 10:47:10 AM

## 2011-03-31 ENCOUNTER — Inpatient Hospital Stay (HOSPITAL_COMMUNITY): Payer: PRIVATE HEALTH INSURANCE

## 2011-03-31 DIAGNOSIS — R509 Fever, unspecified: Secondary | ICD-10-CM

## 2011-03-31 DIAGNOSIS — I6200 Nontraumatic subdural hemorrhage, unspecified: Secondary | ICD-10-CM

## 2011-03-31 LAB — CBC
HCT: 27.2 % — ABNORMAL LOW (ref 36.0–46.0)
Hemoglobin: 9.6 g/dL — ABNORMAL LOW (ref 12.0–15.0)
MCH: 32.2 pg (ref 26.0–34.0)
MCV: 91.3 fL (ref 78.0–100.0)
Platelets: 145 10*3/uL — ABNORMAL LOW (ref 150–400)
RBC: 2.98 MIL/uL — ABNORMAL LOW (ref 3.87–5.11)
WBC: 4.3 10*3/uL (ref 4.0–10.5)

## 2011-03-31 LAB — BASIC METABOLIC PANEL
BUN: 15 mg/dL (ref 6–23)
Chloride: 103 mEq/L (ref 96–112)
GFR calc Af Amer: 69 mL/min — ABNORMAL LOW (ref >90)
GFR calc non Af Amer: 59 mL/min — ABNORMAL LOW (ref >90)
Potassium: 4.3 mEq/L (ref 3.5–5.1)
Sodium: 137 mEq/L (ref 135–145)

## 2011-03-31 LAB — CULTURE, BLOOD (ROUTINE X 2)
Culture  Setup Time: 201210190218
Culture: NO GROWTH

## 2011-03-31 LAB — GLUCOSE, CAPILLARY: Glucose-Capillary: 173 mg/dL — ABNORMAL HIGH (ref 70–99)

## 2011-04-01 LAB — CSF CULTURE W GRAM STAIN

## 2011-04-01 LAB — GLUCOSE, CAPILLARY
Glucose-Capillary: 183 mg/dL — ABNORMAL HIGH (ref 70–99)
Glucose-Capillary: 255 mg/dL — ABNORMAL HIGH (ref 70–99)
Glucose-Capillary: 146 mg/dL — ABNORMAL HIGH (ref 70–99)
Glucose-Capillary: 214 mg/dL — ABNORMAL HIGH (ref 70–99)

## 2011-04-01 LAB — WOUND CULTURE: Culture: NO GROWTH

## 2011-04-01 LAB — CULTURE, BLOOD (ROUTINE X 2)
Culture  Setup Time: 201210201136
Culture  Setup Time: 201210201136
Culture: NO GROWTH

## 2011-04-01 NOTE — Progress Notes (Signed)
[name], Alexandra NO.:  [id number]  MEDICAL RECORD NO.:  [id number]  LOCATION:  3110                         FACILITY:  [facility]  PHYSICIAN:  Zannie Cove, MD     DATE OF BIRTH:  24-Sep-1940                                PROGRESS NOTE   PRIMARY CARE PHYSICIAN: Marjory Lies, MD  NEUROSURGEON: Stefani Dama, MD and Cristi Loron, MD  CURRENT ACTIVE DIAGNOSES: 1. Infected subdural hematoma. 2. Recurrent seizures. 3. Paroxysmal atrial fibrillation, not a Coumadin candidate anymore     due to subdural hematoma. 4. Type 2 diabetes. 5. History of hypertension. 6. Dyslipidemia. 7. History of coronary artery disease. 8. History of total abdominal hysterectomy and bilateral salpingo-     oophorectomy. 9. History of breast cancer.  CONSULTANTS: 1. Cristi Loron, MD with Neurosurgery. 2. Gardiner Barefoot, MD with Infectious Disease. 3. Thana Farr, MD with Neurology.  PROCEDURES: Include left craniotomy for evacuation of subdural hematoma and obtaining subdural cultures.  DIAGNOSTICS INVESTIGATIONS: Include EEG on March 25, 2011, which showed no electrographic seizures, epileptiform discharges, or nonconvulsive status was seen.  OTHER IMAGING STUDIES: 1. CT of the head on March 21, 2011, shows decreased bilateral     subdural collections, improved pneumocephalus.  MRI of the brain on     March 22, 2011, showed no acute thrombotic infarct, post     bilateral frontal craniotomy for drainage of subdural hematoma, gas     and fluid collection surrounds the craniotomy sites, residual     bilateral extra-axial collections measure up to 1.2 cm on the left     and 1.1 cm on the right. 2. CT of the chest on March 25, 2011, showed no specific findings of     malignancy, coronary atherosclerosis, old granulomatous disease,     and right adrenal adenoma. 3. MRI of the brain on March 28, 2011, showed development of     abnormal FLAIR in  the left frontal Cardex cortex and adjacent     leptomeninges abnormal postcontrast enhancement concern for left     cerebritis meningitis or postop infection. 4. Improved right hemisphere x-rays showed fluid collection and     persistent left hemisphere x-ray showed a fluid collection. 5. Lumbar puncture on March 29, 2011. 6. CT of the head on October, 25, 2012, no acute findings, decreased     bilateral extra-axial fluid collections with pneumocephalus, no new     abnormalities.  HOSPITAL COURSE: Alexandra [name] is a 70 year old female who had bilateral craniotomy for subdural hematoma on March 01, 2011, was subsequently discharged home in early October 2012, presented to the hospital about a little over a week post discharge with recurrent seizures.  1. Recurrent seizures which was felt to be secondary to cerebritis versus subdural infection of the subdural hematomas.  She was continued on Dilantin and Keppra and Vimpat were added to her regimen.  Neurology is following her through her hospital course.  The patient has been seizure free for over 48 hours, and EEG was done with results as dictated previously.   2. 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 She had the head MRI with results as dictated above and also had a lumbar puncture on March 29, 2011, which showed increased WBCs of 98 and had cultures as well drawn at that time.  She has been followed by ID and neurosurgery through her hospital stay. The cultures from CSF are negative to date. but due to the fact that the concern was for the remaining subdural fluid being infected, she underwent craniotomy on March 30, 2011 and had fluid obtained for cultures as well.Subsequently, the patient has done well postoperatively, did not have any further seizure activity and neurologically, has remained  intact except for transient episode of confusion overnight which has since resolved.  At this time, she is on empiric cefepime and vancomycin per Infectious Disease.  We will recommend the duration of these antibiotics.  Cultures based on the subdural fluid as well as LP/CSF is negative so far.  Rest of her chronic medical problems remained stable.  She is being transitioned to the neuro floor today.  We will also get physical therapy evaluation.     Zannie Cove, MD     PJ/MEDQ  D:  04/01/2011  T:  04/01/2011  Job:  [id number]  cc:   Marjory Lies, M.D. Fax: [phone]  Electronically Signed by Zannie Cove  on 04/01/2011 04:16:04 PM

## 2011-04-02 LAB — BASIC METABOLIC PANEL
BUN: 10 mg/dL (ref 6–23)
Chloride: 103 mEq/L (ref 96–112)
Creatinine, Ser: 0.91 mg/dL (ref 0.50–1.10)
GFR calc Af Amer: 72 mL/min — ABNORMAL LOW (ref >90)
GFR calc non Af Amer: 62 mL/min — ABNORMAL LOW (ref >90)
Potassium: 3.6 mEq/L (ref 3.5–5.1)

## 2011-04-02 LAB — GLUCOSE, CAPILLARY
Glucose-Capillary: 157 mg/dL — ABNORMAL HIGH (ref 70–99)
Glucose-Capillary: 153 mg/dL — ABNORMAL HIGH (ref 70–99)
Glucose-Capillary: 237 mg/dL — ABNORMAL HIGH (ref 70–99)

## 2011-04-02 LAB — CBC
HCT: 25 % — ABNORMAL LOW (ref 36.0–46.0)
MCHC: 35.6 g/dL (ref 30.0–36.0)
Platelets: 164 10*3/uL (ref 150–400)
RDW: 12 % (ref 11.5–15.5)
WBC: 3.2 10*3/uL — ABNORMAL LOW (ref 4.0–10.5)

## 2011-04-02 LAB — CULTURE, BLOOD (ROUTINE X 2): Culture: NO GROWTH

## 2011-04-03 LAB — URINALYSIS, MICROSCOPIC ONLY
Glucose, UA: NEGATIVE mg/dL
Ketones, ur: NEGATIVE mg/dL
Leukocytes, UA: NEGATIVE
Protein, ur: NEGATIVE mg/dL
pH: 6 (ref 5.0–8.0)

## 2011-04-03 LAB — GLUCOSE, CAPILLARY
Glucose-Capillary: 176 mg/dL — ABNORMAL HIGH (ref 70–99)
Glucose-Capillary: 207 mg/dL — ABNORMAL HIGH (ref 70–99)
Glucose-Capillary: 215 mg/dL — ABNORMAL HIGH (ref 70–99)

## 2011-04-04 LAB — CBC
MCH: 31.6 pg (ref 26.0–34.0)
MCV: 90.8 fL (ref 78.0–100.0)
Platelets: 189 10*3/uL (ref 150–400)
RBC: 2.94 MIL/uL — ABNORMAL LOW (ref 3.87–5.11)
RDW: 12 % (ref 11.5–15.5)

## 2011-04-04 LAB — ANAEROBIC CULTURE

## 2011-04-04 LAB — GLUCOSE, CAPILLARY
Glucose-Capillary: 211 mg/dL — ABNORMAL HIGH (ref 70–99)
Glucose-Capillary: 189 mg/dL — ABNORMAL HIGH (ref 70–99)

## 2011-04-04 LAB — BASIC METABOLIC PANEL
CO2: 27 mEq/L (ref 19–32)
Calcium: 9.9 mg/dL (ref 8.4–10.5)
Creatinine, Ser: 0.91 mg/dL (ref 0.50–1.10)
GFR calc Af Amer: 72 mL/min — ABNORMAL LOW (ref >90)
GFR calc non Af Amer: 62 mL/min — ABNORMAL LOW (ref >90)
Sodium: 140 mEq/L (ref 135–145)

## 2011-04-05 LAB — COMPREHENSIVE METABOLIC PANEL
AST: 18 U/L (ref 0–37)
Albumin: 2.7 g/dL — ABNORMAL LOW (ref 3.5–5.2)
Alkaline Phosphatase: 104 U/L (ref 39–117)
BUN: 8 mg/dL (ref 6–23)
Chloride: 103 mEq/L (ref 96–112)
Potassium: 2.8 mEq/L — ABNORMAL LOW (ref 3.5–5.1)
Sodium: 140 mEq/L (ref 135–145)
Total Bilirubin: 0.2 mg/dL — ABNORMAL LOW (ref 0.3–1.2)
Total Protein: 6 g/dL (ref 6.0–8.3)

## 2011-04-05 LAB — GLUCOSE, CAPILLARY
Glucose-Capillary: 343 mg/dL — ABNORMAL HIGH (ref 70–99)
Glucose-Capillary: 61 mg/dL — ABNORMAL LOW (ref 70–99)

## 2011-04-05 LAB — PHENYTOIN LEVEL, TOTAL: Phenytoin Lvl: 7.1 ug/mL — ABNORMAL LOW (ref 10.0–20.0)

## 2011-04-05 LAB — URINE CULTURE
Colony Count: NO GROWTH
Culture: NO GROWTH

## 2011-04-05 NOTE — Discharge Summary (Signed)
Alexandra [name], Alexandra [name] NO.:  [id number]  MEDICAL RECORD NO.:  [id number]  LOCATION:  4030                         FACILITY:  [facility]  PHYSICIAN:  Ranelle Oyster, M.D.DATE OF BIRTH:  08-03-1940  DATE OF ADMISSION:  03/04/2011 DATE OF DISCHARGE:  03/14/2011                              DISCHARGE SUMMARY   DISCHARGE DIAGNOSES: 1. Bilateral subdural hematoma status post bilateral craniotomy for     evacuation of hematoma on February 28, 2011. 2. Pain management. 3. Thigh-high stockings for deep vein thrombosis prophylaxis. 4. Depression. 5. Seizure disorder. 6. Diabetes mellitus. 7. Hypertension. 8. Hyperlipidemia. 9. Gastroesophageal reflux disease. 10.History of atrial fibrillation.  A 70 year old white female with history of recurrent falls, progressive headache, and decrease in ambulation who was admitted on February 27, 2011 with cranial CT scan showing bilateral subdural hematoma. Underwent bilateral craniotomy, evacuation of hematoma on February 28, 2011 per Dr. Lovell Sheehan.  Maintained on Dilantin for documented history of seizure disorder.  She had some urinary retention with a scan of 850. Urinalysis on March 01, 2011 was negative.  She was total assist to ambulate.  She was admitted for comprehensive rehab program.  PAST MEDICAL HISTORY:  See discharge diagnosis.  No alcohol or tobacco.  ALLERGIES:  SULFA.  SOCIAL HISTORY:  Lives with her brother.  Brother is in-and-out, but can assist.  She has a sister also in the area.  One-level home, 3 steps to entry.  FUNCTIONAL HISTORY:  Prior to admission was independent.  She does drive.  FUNCTIONAL STATUS:  Upon admission to Rehab Services was moderate assist supine, total assist sit to stand, total assist of two 10 feet bilateral handheld assistance, minimum-to-moderate assist activities of daily living.  MEDICATIONS PRIOR TO ADMISSION: 1. Lisinopril 20 mg daily. 2. Zoloft 100 mg  daily. 3. Valium 5 mg twice daily as needed. 4. Bystolic 20 mg daily. 5. Norvasc 5 mg daily. 6. Zocor 40 mg at bedtime. 7. Dilantin 100 mg 3 times daily. 8. Lantus insulin 50 units a.m., 35 units p.m. 9. NovoLog sliding scale. 10.Hydrocodone as needed.  PHYSICAL EXAMINATION:  VITAL SIGNS:  Blood pressure 145/69, pulse 69, temperature 97.9, respirations 20. GENERAL:  This was an alert female, in no acute distress, oriented x3. She had some mild delay in process, and craniotomy site healing nicely with staples intact. NEUROLOGIC:  Deep tendon reflexes 2+.  Sensation mildly decreased to light touch on the left. LUNGS:  Clear to auscultation. CARDIAC:  Regular rate and rhythm ABDOMEN:  Soft, nontender.  Good bowel sounds.  REHABILITATION HOSPITAL COURSE:  The patient was admitted to Inpatient Rehab Services with therapies initiated on a 3-hour daily basis consisting of physical therapy, occupational therapy, speech therapy and rehabilitation nursing.  The following issues were addressed during the patient's rehabilitation stay.  Pertaining to Alexandra [name]'s bilateral subdural hematoma, she had undergone bilateral craniotomy.  Evacuation of hematoma was on February 28, 2011 per Dr. Tressie Stalker.  Her latest cranial CT scan on March 01, 2011 showed improvement in subdural hematoma with no midline shift.  She remained on Vicodin for pain management with good results.  She did have a history of documented seizure disorder with no  seizure activity noted during her rehab stay. She remained on Dilantin 100 mg 3 times daily.  Her blood sugars had some modest variables.  Her Lantus insulin was adjusted accordingly. She received full diabetic teaching.  Blood pressures controlled with no orthostatic changes on Norvasc, lisinopril, and Bystolic.  She will continue her Crestor for hyperlipidemia.  She did have a documented history of atrial fibrillation.  No Coumadin was initiated due  to the patient's fall risks.  She had no chest pain or shortness of breath. The patient received weekly collaborative interdisciplinary team conferences to discuss estimated length of stay, family teaching, and any barriers to discharge.  She was minimal assist overall for mobility, supervision and minimal assist activities of daily living.  Her family would provide the necessary supervision at home.  She was advised of no driving.  Therapist did note some decrease in step length and heel strikes on the left.  Berg testing was 24/56.  Again was discussed the need for her supervision due to fall risks.  Ongoing therapy should be dictated as per [employer].  LABORATORY DATA:  Latest labs showed a sodium 140, potassium 4.0, BUN 19, creatinine 1.06.  Hemoglobin of 9.7, hematocrit 27.5, platelets 139,000, WBC is 3.1.  DISCHARGE MEDICATIONS AT THE TIME OF DICTATION: 1. Norvasc 2.5 mg daily. 2. Lantus insulin 45 units a.m., 20 units p.m. 3. Lisinopril 10 mg daily. 4. Bystolic 20 mg daily. 5. Protonix 40 mg at bedtime. 6. Dilantin 100 mg 3 times daily. 7. Crestor 10 mg at bedtime. 8. Senokot tablets 2 at bedtime.9. Zoloft 100 mg daily. 10.Vicodin 5/325 one or two tablets every 4 hours as needed for pain,     dispense of 60 tablets.  DIET:  Diabetic diet.  SPECIAL INSTRUCTIONS:  Continue therapies as dictated as per [employer].  The patient would follow up with Dr. Faith Rogue, the Outpatient Rehab Service office, as directed;  Dr. Marjory Lies, medical management, in 2 weeks; Dr. Tressie Stalker, Neurosurgery, [phone], call for appointment.     Mariam Dollar, P.A.   ______________________________ Ranelle Oyster, M.D.    DA/MEDQ  D:  03/14/2011  T:  03/14/2011  Job:  [id number]  cc:   Marjory Lies, M.D. Cristi Loron, M.D. Ranelle Oyster, M.D.  Electronically Signed by Mariam Dollar P.A. on 03/14/2011 01:38:25 PM Electronically Signed by Faith Rogue M.D. on 04/05/2011 09:14:28 AM

## 2011-04-05 NOTE — H&P (Signed)
NAMESHARNELLE, CAPPELLI NO.:  [id number]  MEDICAL RECORD NO.:  [id number]  LOCATION:                                 FACILITY:  PHYSICIAN:  Ranelle Oyster, M.D.DATE OF BIRTH:  10-16-1940  DATE OF ADMISSION:  03/04/2011 DATE OF DISCHARGE:                             HISTORY & PHYSICAL   CHIEF COMPLAINTS:  Decreased coordination, confusion, recent falls.  HISTORY OF PRESENT ILLNESS:  This is a pleasant 70 year old white female with progressive headache and impaired gait, who was admitted on September 23.  CT of her head show bilateral subdural hematomas.  She had been having some falls recently and etiology was unclear per the patient.  The patient did undergo a bilateral craniotomy and evacuation of hematoma on September 24 by Dr. Lovell Sheehan.  She maintained on Dilantin for documented history of seizure disorder.  Urine retention had been noted and the voiding trials underway.  She did scan recently for an 850 mL volume.  Urinalysis was negative although culture not performed. Rehab has been following along with this patient and felt that she could benefit from inpatient rehab stay and ultimately she was admitted today.  REVIEW OF SYSTEMS:  Notable for headaches, history of seizures, decreased mood.  She reports constipation as well.  Full 12-point reviews in the written H and P.  PAST MEDICAL HISTORY:  Positive for insulin-requiring diabetes, CAD, CABG, OA, hypertension, hyperlipidemia, AFib, although she is on Coumadin due to history of falls, depression, history of breast cancer, radiation, GERD, history of right fibular fracture, cholecystectomy, hysterectomy.  FAMILY HISTORY:  Positive for diabetes.  SOCIAL HISTORY:  She denies alcohol or tobacco.  The patient lives with her brother-in-law.  Brother-in-law is in-and-out but can assist apparently at home.  Sister works.  She lives in one level house with three steps to enter.  ALLERGIES:   SULFA.  HOME MEDICATIONS:  Lisinopril, Zoloft, diazepam, Bystolic, Norvasc, Zocor, Dilantin, Lantus insulin, NovoLog, and hydrocodone p.r.n.  LABS:  Hemoglobin 9.8, white count 6000, platelets 118,000.  Sodium 140, potassium 4.0.  Dilantin level 2.6 on September 23.  BUN and creatinine 14 and 0.9 respectively.  PHYSICAL EXAMINATION:  VITAL SIGNS:  Blood pressure is 145/69, pulse is 69, respiratory rate 20, temperature 97.9. GENERAL:  The patient is pleasant, alert. HEENT:  Pupils equally round, reactive to light.  Nose and throat exam notable for an edentulous mouth.  Otherwise, mucosa is intact. NECK: Supple without JVD or lymphadenopathy. CHEST:  Clear to auscultation bilaterally without wheezes, rales, or rhonchi. HEART:  Regular rate and rhythm without murmurs, rubs, or gallops. ABDOMEN:  Soft, nontender. SKIN:  Noticeable for a few bumps and ecchymoses.  Scalp notable for two craniotomy sites, which is clean and intact with staples.  NEUROLOGIC: Cranial nerves II-XII are grossly intact.  Reflexes 1+.  Sensation is diminished at 1+/2 left upper extremity, 1+/2+ left lower extremity. Strength is grossly 4/5 left upper extremity.  She is 4-4+/5+ left lower extremity.  She has a positive left pronator drift and decreased fine motor coordination in the left upper and lower extremities today. Strength in right side is 5/5 grossly.  Judgment, orientation, and memory all are remarkably  intact.  Mood is pleasant, cooperative.  POST ADMISSION PHYSICIAN EVALUATION: 1. Functional deficit secondary to bilateral subdural hematoma status     post craniotomy and evacuation.  The patient has left hemiparesis     and impaired balance. 2. The patient is admitted to receive collaborative interdisciplinary     care between the physiatrist, rehab nursing staff and therapy team. 3. The patient's level of medical complexity and substantial therapy     needs in context of that medical necessity  cannot be provided at a     lesser intensity of care. 4. The patient has experienced substantial functional loss from her     baseline.  Premorbidly, she was independent, although, did have a     history of falls.  Currently, she is mod assist with supine to sit,     total assist to sit and stand total assist 60% 10 feet, bilateral     handheld assist min to mod assist ADLs.  Judging by the patient's     diagnosis, physical exam, functional history, she has potential for     functional progress which will result in measurable gains while in     inpatient rehab.  Gains will be of substantial and practical use     upon discharge to home in facilitating mobility and self-care.     Interim changes in medical status since preadmission screening are     detailed above. 5. The physiatrist will provide 24-hour management of the medical     needs as well as oversight of therapy plan/treatment, provide     guidance as appropriate regarding interaction of two.  Medical     problem list and plan are below. 6. A 24-hour rehab nursing team will assist in the management of the     patient's skin care needs as well as bowel and bladder function,  safety awareness, integration of therapy concepts and techniques. 7. PT will assess and treat for lower extremity strength, range of     motion, functional mobility, safety, neuromuscular reeducation,     cognitive perceptual and visual perceptual training with goals     modified independent. 8. OT will assess and treat for upper extremity use ADLs, adaptive     techniques, equipment, functional mobility, safety, upper extremity     strength with goals, modified independent supervision. 9. Speech language pathology will assess, screen, and treat for any     cognitive deficits with goals modified independent. 10.Case management and social worker will assess and treat for     psychosocial issues and discharge planning. 11.Team conference will be held weekly to  assess progress towards     goals and to determine barriers at discharge. 12.The patient demonstrated sufficient medical stability and exercise     capacity to tolerate at least 3 hours therapy per day at least 5     days per week. 13.Estimated length of stay is 10-14 days.  Prognosis is good.  The     patient is quite motivated.  MEDICAL PROBLEM LIST AND PLAN: 1. DVT prophylaxis with thigh-high TED hose and ambulation.  No     anticoagulants due to bleeding risks currently. 2. Pain management with p.r.n. Vicodin.  She is using this primarily     for headaches and has adequate control at present.  Observe for any     neuro sedating side effects of this medication. 3. Mood control:  Zoloft.  Team will provide emotional support.  The  patient should see further improvement in mood as she improves     functionally 4. Seizure disorder:  Dilantin 100 mg t.i.d.  Recheck Dilantin level.     If she remains seizure free, I am not iclined to increase her dosing     at this point given toxic side effects of the Dilantin even if the level is subtherapeutic. 5. Diabetes mellitus:  Check CBGs before meals and at bedtime.  Cover     with sliding scale insulin.  Lantus is on board 25 units b.i.d.  We     will titrate as needed. 6. Blood pressure control:  Norvasc, lisinopril, and Bystolic.  Blood     pressure is borderline to normal today.  Follow with activity and     pain levels and adjust accordingly. 7. Hyperlipidemia:  Crestor. 8. Reflux:  Protonix. 9. History of AFib:  Monitor for rate control.  This is an ideal     environment to monitor her heart rate and effects upon her balance     and cardiovascular state.  This is likely at least partly involved     with her falls in my opinion.     Ranelle Oyster, M.D.     ZTS/MEDQ  D:  03/04/2011  T:  03/04/2011  Job:  [id number]  cc:   Marjory Lies, M.D. Cristi Loron, M.D.  Electronically Signed by Faith Rogue M.D. on  04/05/2011 09:15:47 AM

## 2011-04-05 NOTE — Discharge Summary (Signed)
[name], Alexandra [name] NO.:  [id number]  MEDICAL RECORD NO.:  [id number]  LOCATION:  3034                         FACILITY:  [facility]  PHYSICIAN:  Alexandra [name], M.D. DATE OF BIRTH:  09-Jun-1940  DATE OF ADMISSION:  03/21/2011 DATE OF DISCHARGE:  04/05/2011                        DISCHARGE SUMMARY - REFERRING   PRIMARY CARE PHYSICIAN:  Alexandra Lies, MD  NEUROSURGEON:  Alexandra Loron, MD  DISCHARGE DIAGNOSES: 1. Concern for infected subdural hematoma/cerebritis with negative     cultures. 2. Recurrent seizures. 3. Paroxysmal atrial fibrillation, not a Coumadin candidate given     subdural hematoma. 4. Hypertension. 5. Type 2 diabetes. 6. Hyperlipidemia. 7. Coronary artery disease. 8. Prior history of breast cancer.  DISCHARGE MEDICATIONS: 1. Amlodipine 5 mg daily. 2. Cefepime 2 g IV twice daily until April 19, 2011. 3. Lantus insulin 20 units subcutaneously twice daily. 4. Vimpat 150 mg twice daily. 5. Keppra 1250 mg twice daily. 6. Lisinopril 20 mg daily. 7. Dilantin 150 mg 3 times a day. 8. Vancomycin 1250 mg IV daily until April 19, 2011. 9. Tylenol 650 mg every 4 hours as needed for pain or fever. 10.Bystolic 20 mg daily. 11.Diazepam 5 mg twice daily as needed for anxiety or agitation. 12.Vicodin 5/325 mg 1-2 tablets every 4 hours as needed for pain. 13.Protonix 40 mg at bedtime. 14.Zocor 40 mg at bedtime. 15.Vitamin D2 50,000 units weekly. 16.Senokot 2 tablets daily at bedtime as needed for constipation. 17.Tucks ointment 3 times a day as needed, applied rectally for     hemorrhoids.  DISPOSITION AND FOLLOWUP:  [name] will be discharged today back to her skilled nursing facility in stable and improved condition.  She will need to remain on IV antibiotics as scheduled above until April 19, 2011.  She will need to procure an appointment with Dr. Luciana [name] with Infectious Disease in 2 weeks and with a neurosurgeon, Dr. Lovell [name]  in 3 weeks.  IMAGES AND PROCEDURES:  This hospitalization include a CT scan of the head on March 21, 2011, that showed decreased bilateral subdural fluid collections.  No evidence of acute superimposed hemorrhage with improved pneumocephalus.  Chest x-ray on March 21, 2011, showed no evidence for active pulmonary disease.  MRI of the brain on March 22, 2011, that showed no acute thrombotic infarct.  Post bilateral frontal craniotomy for drainage of subdural collections.  There is gas and fluid containing collection surrounds the craniotomy site.  On March 28, 2011, repeat brain MRI that showed development of abnormal FLAIR signal in the left frontal cortex and adjacent leptomeninges with abnormal post contrast enhancement.  Concern raised for left cerebritis/meningitis/postoperative infection.  CT scan of the head without contrast on March 31, 2011, that showed no acute findings, decreased bilateral extra-axial fluid collections, and pneumocephalus. No new abnormality.  HISTORY AND PHYSICAL:  For full details, please refer to dictation on March 22, 2011, by Dr. Mikeal [name].  However, in brief, Mrs. Alexandra [name] is a 70 year old Caucasian lady, who was recently discharged from the hospital after having craniotomies bilaterally for subdural hematomas. She was apparently transferred back home secondary to seizure episodes and altered mentation.  Because of this, we are asked to admit for further evaluation.  HOSPITAL COURSE: 1. Seizure disorder.  Neurology has been involved.  Her seizures are     now controlled on a combination of Dilantin, Keppra, and Vimpat.     She has had no further seizure activity while in the hospital. 2. Infected subdural hematoma/cerebritis.  This concern was raised on     MRI.  An LP was done, which did show 84 white cells in the CSF.  ID     was consulted and she was empirically placed on IV vancomycin and     cefepime.  Neurosurgery did go back and did  draw some cultures and     they cleaned out the previous craniotomy site.  Unfortunately, all     culture data including that from the CSF and the craniotomy have     all been negative.  After discussion with ID, we have decided to     treat her with 2 weeks of IV vancomycin and cefepime. 3. History of atrial fibrillation.  She has been rate controlled,     unfortunately she is no longer a Coumadin candidate given her     subdural hematomas.  Rest of conditions are stable.  Vitals on day of discharge, blood pressure 160/83, heart rate 65, respirations 18, temperature 97.9, saturations 97% on room air.     Alexandra [name], M.D.     EH/MEDQ  D:  04/05/2011  T:  04/05/2011  Job:  [id number]  cc:   Alexandra [name], M.D. Alexandra [name], M.D.  Electronically Signed by Alexandra [name] M.D. on 04/05/2011 06:31:42 PM

## 2011-04-06 LAB — GLUCOSE, CAPILLARY
Glucose-Capillary: 186 mg/dL — ABNORMAL HIGH (ref 70–99)
Glucose-Capillary: 141 mg/dL — ABNORMAL HIGH (ref 70–99)

## 2011-04-06 LAB — VANCOMYCIN, TROUGH: Vancomycin Tr: 23.1 ug/mL — ABNORMAL HIGH (ref 10.0–20.0)

## 2011-04-06 NOTE — Op Note (Signed)
Alexandra [name], Alexandra [name] NO.:  [id number]  MEDICAL RECORD NO.:  [id number]  LOCATION:  3110                         FACILITY:  [facility]  PHYSICIAN:  Cristi Loron, M.D.DATE OF BIRTH:  01/06/1941  DATE OF PROCEDURE:  03/30/2011 DATE OF DISCHARGE:                              OPERATIVE REPORT   BRIEF HISTORY:  The patient is a 70 year old white female who I performed a bilateral craniotomy for subdural hematoma about 6 weeks ago.  The patient was readmitted with seizures.  She began having some fevers.  She was worked up with a brain MRI, which demonstrated recurrence of her subdural hematomas and some brain edema on the left. She underwent a lumbar puncture, which demonstrated elevated white cells.  I discussed the various treatment options with the patient and her son and recommended that she consider a redo left craniotomy for evacuation of subdural hematoma for cultures.  I described the surgery to them.  We have discussed the risks, benefits and alternatives.  I answered all her questions, they decided to proceed with the operation.  PREOPERATIVE DIAGNOSIS:  Left subdural hematoma.  POSTOPERATIVE DIAGNOSIS:  Left subdural hematoma.  PROCEDURE:  Left craniotomy for evacuation of subdural hematoma and obtaining of subdural cultures.  SURGEON:  Cristi Loron, MD  ASSISTANT:  None.  ANESTHESIA:  General tracheal.  ESTIMATED BLOOD LOSS:  Minimal.  SPECIMENS:  None.  CULTURES:  None.  DRAINS:  None.  COMPLICATIONS:  None.  DESCRIPTION OF PROCEDURE:  The patient was brought to the operating room by the anesthesia team.  General endotracheal anesthesia was induced. The patient remained in supine position.  A roll was placed under her left shoulder.  Her calvarium was supported in [place] three-point headrest.  Her head was turned to the right exposing her left scalp, this was shaved with clippers and prepared with Betadine scrub and Betadine  solution.  Sterile drapes were applied.  I then used a scalpel to incise through the patient's fresh surgical incision.  I used the cerebellar retractor for exposure.  I used periosteal elevator to expose the underlying calvarium and craniotomy flap.  I used the was screwdriver to remove the titanium miniscrews.  I then elevated the craniotomy flap exposing the underlying Gelfoam from the previous operation.  I removed this with suction and irrigation and Gelfoam was somewhat adherent to the underlying brain.  I did encounter some small amount of subdural yellowish clear fluid, did not appear to be grossly infected.  We did obtain cultures.  We then irrigated the subdural space with bacitracin solution, then hemostasis using bipolar electrocautery. We then reapproximated the patient's dura with interrupted 4-0 Nurolon suture.  I then replaced the patient's craniotomy flap with titanium miniplate screws.  We removed the retractor and then we reapproximated the patient's galea with interrupted 2-0 Vicryl suture, and the skin with stainless steel staples.  The wound was then coated with bacitracin ointment.  The sterile dressing was applied.  The drapes were removed. [place] three-point headrest was removed from the patient's calvarium. The patient was subsequently extubated and transported to the Postanesthesia Care Unit in stable condition.  All sponge, instrument, and needle counts were correct  at the end of this case.     Cristi Loron, M.D.     JDJ/MEDQ  D:  03/30/2011  T:  03/30/2011  Job:  [id number]  Electronically Signed by Tressie Stalker M.D. on 04/06/2011 11:12:53 AM

## 2011-04-08 NOTE — Discharge Summary (Signed)
  NAMESARAYA, Alexandra [name] NO.:  [id number]  MEDICAL RECORD NO.:  [id number]  LOCATION:  3034                         FACILITY:  [facility]  PHYSICIAN:  Brendia Sacks, MD    DATE OF BIRTH:  [birth year]  DATE OF ADMISSION:  03/21/2011 DATE OF DISCHARGE:  04/06/2011                              DISCHARGE SUMMARY   ADDENDUM  PRIMARY CARE PHYSICIAN:  Marjory Lies, M.D.  PRIMARY NEUROSURGEON:  Cristi Loron, M.D.  PRIMARY INFECTIOUS DISEASE PHYSICIAN:  Gardiner Barefoot, M.D.  This is an addendum to the discharge summary dictated by Dr. Peggye Pitt yesterday, April 05, 2011.  Please refer to that discharge summary for full details.  The patient's clinical course has remained unchanged since Dr. Ardyth Harps' dictation.  She has had no further seizures and no further problems.  She remained stable for discharge and now has a skilled bed. She will be discharged today.  DISCHARGE MEDICATIONS:  This list supersedes Dr. Ardyth Harps' dictation. 1. Amlodipine 5 mg p.o. daily. 2. Cefepime 2 g IV b.i.d., last dose April 12, 2011. 3. Diazepam 5 mg p.o. b.i.d. 4. Lantus 28 units subcu b.i.d. 5. Vimpat 150 mg p.o. b.i.d. 6. Keppra 1250 mg p.o. b.i.d. 7. Lisinopril 20 mg p.o. daily. 8. Phenytoin 150 mg p.o. t.i.d. 9. Vancomycin 1 g IV q.24 h., last dose April 12, 2011.  Please     pharmacy continue to follow and adjust dosage as clinically     indicated.  This dosage was just decreased today for slight     supratherapeutic trough level.  Recommend continuing to follow per     pharmacy. 10.Tylenol 325 mg 1-2 tablets by mouth every 4 hours as needed for     mild pain. 11.Bystolic 20 mg p.o. daily. 12.Lortab 5/325 1-2 tablets by mouth every 4 hours as needed for     moderate-to-severe pain. 13.Protonix 40 mg p.o. at bedtime. 14.Simvastatin 40 mg p.o. at bedtime. 15.Senna 2 tablets p.o. at bedtime. 16.Tucks ointment 1 application rectally t.i.d. as needed for  hemorrhoids. 17.Vitamin D2 50,000 units p.o. weekly.  Discontinue the following medications. 1. Zoloft. 2. Note dosage changes and schedule changes to diazepam and Dilantin.     These medication should be continued as described above.  Also note     dosage change to Lantus, lisinopril, and Norvasc. 3. The patient is on mechanical soft diet with thin liquids, as it is     hard for her to chew hard materials.  I have discussed the case with Dr. Lovell Sheehan who agrees to discharge today and Dr. Luciana Axe who specified a total of 2 weeks IV antibiotics thus ending Number 6, 2012.  Time reviewing the patient's chart, coordinating her care is 100 minutes.     Brendia Sacks, MD     DG/MEDQ  D:  04/06/2011  T:  04/06/2011  Job:  [id number]  cc:   Marjory Lies, M.D. Cristi Loron, M.D. Gardiner Barefoot, MD  Electronically Signed by Brendia Sacks  on 04/08/2011 06:33:35 PM

## 2011-04-08 NOTE — H&P (Signed)
NAMEJAHARI, Alexandra [name] NO.:  [id number]  MEDICAL RECORD NO.:  [id number]  LOCATION:  3003                         FACILITY:  [facility]  PHYSICIAN:  Lonia Blood, M.D.      DATE OF BIRTH:  [birth year]  DATE OF ADMISSION:  03/21/2011 DATE OF DISCHARGE:                             HISTORY & PHYSICAL   PRIMARY CARE PHYSICIAN:  Marjory Lies, MD  NEUROSURGEON:  Stefani Dama, MD  PRESENTING COMPLAINTS:  Seizure.  HISTORY OF PRESENT ILLNESS:  The patient is a 70 year old female from nursing facility who was brought in secondary to sustaining a seizure episode and altered mental status.  The patient has extensive medical history and apparently recently had a craniotomy for subdural hematoma. She has had recurrent seizures but has been stable recently.  She had a seizure episode at the nursing facility and when the EMS arrived, her blood glucose of 300, but she was sick laying down on the couch and having a seizure.  She was brought into the ED with onset of seizure. Initially, she did have what appeared to be postictal status, but at this point, the patient is awake and communicating, able to talk.  She had a repeat scan of her head that showed that her subdural hematoma, which has been evacuated to the barest minimum.  There is no change. She has had seizure, was probably related to her chronic seizure disorder.  Her Dilantin level was very low, which may have caused the seizure.  Hence, she is being admitted for further management. Neurology has been consulted and have decided to consult rather than take charge.  The patient is able to give history and denied any other complaint.  No pain.  No dizziness.  No headaches.  No nausea, vomiting, or diarrhea.  PAST MEDICAL HISTORY:  Significant for atrial fibrillation, history of breast cancer, diabetes, hyperlipidemia, hypertension, peripheral edema, seizure disorder, history of subdural hematoma bilaterally, status  post bur hole and removal by Dr. Danielle Dess about 3 weeks ago, status post cholecystectomy.  ALLERGIES:  SULFA.  MEDICATIONS:  Amlodipine, Bystolic, Lantus, lisinopril, Protonix, phenytoin, sertraline, simvastatin, temazepam, vitamin C, and vitamin D 2.  SOCIAL HISTORY:  The patient lives in [place].  She is currently in nursing home.  No tobacco, alcohol, or IV drug use.  FAMILY HISTORY:  Denied any significant family history.  REVIEW OF SYSTEMS:  Also reviewed, currently are negative except per HPI.  PHYSICAL EXAMINATION:  VITAL SIGNS:  Initial temperature was 98.2, she did get her temperature as high as 101.1, blood pressure 145/66, pulse 83, respiratory rate of 16, sats 99% on room air. GENERAL:  The patient is awake, alert, communicating.  She is in no acute distress. HEENT:  PERRL.  EOMI.  No pallor.  No jaundice.  No rhinorrhea. NECK:  Supple.  No JVD.  No lymphadenopathy. RESPIRATORY:  She has good air entry bilaterally.  No wheezes.  No rales. CARDIOVASCULAR:  She has irregularly irregular rhythm. ABDOMEN:  Soft, nontender with positive bowel sounds. EXTREMITIES:  No edema, cyanosis, or clubbing. SKIN:  The patient has lost her hair from surgery.  She does have a scar on the skull from a recent  subdural hematoma surgery, but no other rashes, ulcers, or active healing wounds.  LABORATORY DATA:  Her white count is 6.9, hemoglobin 10.8 with platelet count of 162.  PT 14.6, INR 1.12, PTT of 31.  Sodium 133, potassium 4.2, chloride 98, CO2 of 24, glucose 182, BUN 29, creatinine 1.50, albumin 3.2 with a calcium of 9.8.  Her Dilantin level is 2.5.  Urinalysis showed hazy urine with trace leukocyte esterase.  Urine microscopy showed wbc's 0-2.  Urine drug screen is only positive for benzodiazepines.  Chest x-ray showed no evidence of active pulmonary disease with shallow breathing.  Head CT without contrast showed decreased bilateral subdural fluid collections.  No evidence of  acute superimposed hemorrhage.  There is improved pneumocephalus.  ASSESSMENT:  This is a 70 year old female presenting with an episode of seizure in a patient with known seizure disorder and low Dilantin level.  Plan therefore, 1. Seizure disorder.  The patient will be admitted for observation.     She has been loaded with fosphenytoin in the ED.  I will follow the     Dilantin level in a.m.  Keppra is being added to her regimen.  She     will be under seizure precautions, and Neurology will follow     medication adjustments. 2. Atrial fibrillation.  Again, keep the patient on tele and follow up     closely. 3. Diabetes.  I will continue with her home insulin as well as sliding     scale insulin. 4. Hypertension.  Blood pressure is not reasonable.  We will follow     closely. 5. Febrile illness.  This is a transient temperature.  No evidence of     UTI or pneumonia, probably this is from reaction to her seizure     episodes. 6. Hyperlipidemia.  Continue with statin once the patient is able to     take p.o.'s.  Further treatment will depend on the patient's     response to these measures.     Lonia Blood, M.D.LG/MEDQ  D:  03/22/2011  T:  03/22/2011  Job:  [id number]  Electronically Signed by Lonia Blood M.D. on 04/08/2011 06:01:03 AM

## 2011-04-11 ENCOUNTER — Encounter: Payer: PRIVATE HEALTH INSURANCE | Admitting: Physical Medicine & Rehabilitation

## 2011-04-21 ENCOUNTER — Encounter: Payer: Self-pay | Admitting: Internal Medicine

## 2011-04-21 ENCOUNTER — Ambulatory Visit (INDEPENDENT_AMBULATORY_CARE_PROVIDER_SITE_OTHER): Payer: PRIVATE HEALTH INSURANCE | Admitting: Internal Medicine

## 2011-04-21 DIAGNOSIS — S065X9A Traumatic subdural hemorrhage with loss of consciousness of unspecified duration, initial encounter: Secondary | ICD-10-CM

## 2011-04-21 DIAGNOSIS — S065XAA Traumatic subdural hemorrhage with loss of consciousness status unknown, initial encounter: Secondary | ICD-10-CM | POA: Insufficient documentation

## 2011-04-21 DIAGNOSIS — I6200 Nontraumatic subdural hemorrhage, unspecified: Secondary | ICD-10-CM

## 2011-04-21 NOTE — Progress Notes (Signed)
  Subjective:    Patient ID: Alexandra [name], female    DOB: [birth year], 71 y.o.   MRN: [id number]  HPIthis patient comes in for hospital followup. She has a history of craniotomy secondary to subdural hematoma approximately 6 weeks prior to presentation and she came in with a seizure initially with a low Dilantin level there was concern for just a low therapeutic level. She was admitted and observed and did during the hospitalization had decline in her mental status and was taken back to the OR after his nose she also had significant amount of white cells in her CSF and she did require evacuation further of the hematoma. Following the procedure the patient's baseline returned to near normal and she is much improved after the neurosurgical intervention. CSF cultures were sent including fungal AFB and bacterial and knee were all remain negative. The patient was initially started on a regimen of vancomycin and cefepime this was continued for a total of 2 weeks following surgery which she completed a week ago. She has had no complaints in fact is feeling much better and had a hasn't her memory returning has had no fever chills or other complaints. The patient did have her PICC line removed at the end of therapy and had no complaints of the PICC line over the site.    Review of Systems  Constitutional: Negative for fever, chills, fatigue and unexpected weight change.  HENT: Negative for trouble swallowing.   Respiratory: Negative for cough.   Cardiovascular: Negative for chest pain.  Gastrointestinal: Positive for nausea. Negative for vomiting, diarrhea and constipation.  Musculoskeletal: Negative for myalgias and arthralgias.  Skin: Negative for rash.  Neurological: Positive for headaches.       Overall improved  Psychiatric/Behavioral: Negative for confusion and sleep disturbance. The patient is not nervous/anxious.        Objective:   Physical Exam  Constitutional: She appears well-developed and  well-nourished.  HENT:  Head: Normocephalic.       Well healed region of surgery  Eyes: No scleral icterus.  Cardiovascular: Normal rate, regular rhythm and normal heart sounds.   No murmur heard. Pulmonary/Chest: Effort normal and breath sounds normal. She has no wheezes. She has no rales.  Lymphadenopathy:    She has no cervical adenopathy.          Assessment & Plan:

## 2011-04-21 NOTE — Patient Instructions (Signed)
Follow up with PCP

## 2011-04-21 NOTE — Assessment & Plan Note (Signed)
At this time the patient is doing well and is having no signs of any infection or worsening of the subdural hematoma. She is improving everyday including becoming more ambulatory and having less pain and better sleep. She did complete her therapy for presumed meningitis and was adequate for coverage of the culture was negative and unclear how much was due to infection versus a hematoma. Otherwise though at this time as no further infectious disease needs and she was instructed that she can come back as needed but otherwise no other indications for infectious disease followup. She did state that she has had some nausea and will discuss this with her primary physician.

## 2011-04-26 LAB — FUNGUS CULTURE W SMEAR: Fungal Smear: NONE SEEN

## 2011-05-11 LAB — AFB CULTURE WITH SMEAR (NOT AT ARMC): Acid Fast Smear: NONE SEEN

## 2011-06-02 ENCOUNTER — Other Ambulatory Visit: Payer: Self-pay | Admitting: Neurosurgery

## 2011-06-02 ENCOUNTER — Ambulatory Visit
Admission: RE | Admit: 2011-06-02 | Discharge: 2011-06-02 | Disposition: A | Discharge status: Home / Self Care | Payer: PRIVATE HEALTH INSURANCE | Source: Ambulatory Visit | Attending: Neurosurgery | Admitting: Neurosurgery

## 2011-06-02 DIAGNOSIS — I6200 Nontraumatic subdural hemorrhage, unspecified: Secondary | ICD-10-CM

## 2011-06-11 ENCOUNTER — Other Ambulatory Visit: Payer: Self-pay

## 2011-06-11 ENCOUNTER — Encounter (HOSPITAL_COMMUNITY): Payer: Self-pay | Admitting: *Deleted

## 2011-06-11 ENCOUNTER — Emergency Department (HOSPITAL_COMMUNITY): Payer: PRIVATE HEALTH INSURANCE

## 2011-06-11 ENCOUNTER — Inpatient Hospital Stay (HOSPITAL_COMMUNITY)
Admission: EM | Admit: 2011-06-11 | Discharge: 2011-06-14 | DRG: 092 | Disposition: A | Discharge status: Home Health | Payer: PRIVATE HEALTH INSURANCE | Attending: Internal Medicine | Admitting: Internal Medicine

## 2011-06-11 DIAGNOSIS — T420X1A Poisoning by hydantoin derivatives, accidental (unintentional), initial encounter: Secondary | ICD-10-CM

## 2011-06-11 DIAGNOSIS — R270 Ataxia, unspecified: Secondary | ICD-10-CM

## 2011-06-11 DIAGNOSIS — K5900 Constipation, unspecified: Secondary | ICD-10-CM | POA: Diagnosis present

## 2011-06-11 DIAGNOSIS — R269 Unspecified abnormalities of gait and mobility: Principal | ICD-10-CM | POA: Diagnosis present

## 2011-06-11 DIAGNOSIS — N390 Urinary tract infection, site not specified: Secondary | ICD-10-CM | POA: Diagnosis present

## 2011-06-11 DIAGNOSIS — T420X5A Adverse effect of hydantoin derivatives, initial encounter: Secondary | ICD-10-CM | POA: Diagnosis present

## 2011-06-11 DIAGNOSIS — D649 Anemia, unspecified: Secondary | ICD-10-CM | POA: Diagnosis present

## 2011-06-11 DIAGNOSIS — I10 Essential (primary) hypertension: Secondary | ICD-10-CM | POA: Diagnosis present

## 2011-06-11 DIAGNOSIS — N179 Acute kidney failure, unspecified: Secondary | ICD-10-CM | POA: Diagnosis present

## 2011-06-11 DIAGNOSIS — E119 Type 2 diabetes mellitus without complications: Secondary | ICD-10-CM | POA: Diagnosis present

## 2011-06-11 DIAGNOSIS — D696 Thrombocytopenia, unspecified: Secondary | ICD-10-CM | POA: Diagnosis present

## 2011-06-11 DIAGNOSIS — K219 Gastro-esophageal reflux disease without esophagitis: Secondary | ICD-10-CM | POA: Diagnosis present

## 2011-06-11 DIAGNOSIS — Z951 Presence of aortocoronary bypass graft: Secondary | ICD-10-CM

## 2011-06-11 DIAGNOSIS — I4891 Unspecified atrial fibrillation: Secondary | ICD-10-CM | POA: Diagnosis present

## 2011-06-11 DIAGNOSIS — I2510 Atherosclerotic heart disease of native coronary artery without angina pectoris: Secondary | ICD-10-CM

## 2011-06-11 DIAGNOSIS — S065X9A Traumatic subdural hemorrhage with loss of consciousness of unspecified duration, initial encounter: Secondary | ICD-10-CM

## 2011-06-11 DIAGNOSIS — E669 Obesity, unspecified: Secondary | ICD-10-CM | POA: Diagnosis present

## 2011-06-11 DIAGNOSIS — Z853 Personal history of malignant neoplasm of breast: Secondary | ICD-10-CM

## 2011-06-11 DIAGNOSIS — F411 Generalized anxiety disorder: Secondary | ICD-10-CM | POA: Diagnosis present

## 2011-06-11 DIAGNOSIS — D61818 Other pancytopenia: Secondary | ICD-10-CM | POA: Diagnosis present

## 2011-06-11 LAB — COMPREHENSIVE METABOLIC PANEL
Alkaline Phosphatase: 94 U/L (ref 39–117)
BUN: 22 mg/dL (ref 6–23)
Calcium: 9.5 mg/dL (ref 8.4–10.5)
GFR calc Af Amer: 41 mL/min — ABNORMAL LOW (ref >90)
Glucose, Bld: 138 mg/dL — ABNORMAL HIGH (ref 70–99)
Potassium: 5.5 mEq/L — ABNORMAL HIGH (ref 3.5–5.1)
Total Protein: 6.8 g/dL (ref 6.0–8.3)

## 2011-06-11 LAB — URINALYSIS, ROUTINE W REFLEX MICROSCOPIC
Ketones, ur: NEGATIVE mg/dL
Nitrite: POSITIVE — AB
Protein, ur: NEGATIVE mg/dL
Urobilinogen, UA: 1 mg/dL (ref 0.0–1.0)
pH: 6.5 (ref 5.0–8.0)

## 2011-06-11 LAB — CBC
HCT: 31 % — ABNORMAL LOW (ref 36.0–46.0)
Hemoglobin: 11.2 g/dL — ABNORMAL LOW (ref 12.0–15.0)
MCH: 31.9 pg (ref 26.0–34.0)
MCV: 88.3 fL (ref 78.0–100.0)
RBC: 3.51 MIL/uL — ABNORMAL LOW (ref 3.87–5.11)

## 2011-06-11 LAB — GLUCOSE, CAPILLARY: Glucose-Capillary: 174 mg/dL — ABNORMAL HIGH (ref 70–99)

## 2011-06-11 LAB — URINE MICROSCOPIC-ADD ON

## 2011-06-11 LAB — DIFFERENTIAL
Eosinophils Absolute: 0 10*3/uL (ref 0.0–0.7)
Eosinophils Relative: 0 % (ref 0–5)
Lymphs Abs: 1.3 10*3/uL (ref 0.7–4.0)
Monocytes Relative: 6 % (ref 3–12)

## 2011-06-11 MED ORDER — PANTOPRAZOLE SODIUM 20 MG PO TBEC
20.0000 mg | DELAYED_RELEASE_TABLET | Freq: Every day | ORAL | Status: DC
  Administered 2011-06-12 – 2011-06-14 (×3): 20 mg via ORAL
  Filled 2011-06-11 (×3): qty 1

## 2011-06-11 MED ORDER — NEBIVOLOL HCL 10 MG PO TABS
20.0000 mg | ORAL_TABLET | Freq: Every day | ORAL | Status: DC
  Administered 2011-06-12 – 2011-06-14 (×3): 20 mg via ORAL
  Filled 2011-06-11 (×4): qty 2

## 2011-06-11 MED ORDER — LEVETIRACETAM 500 MG PO TABS
1000.0000 mg | ORAL_TABLET | Freq: Two times a day (BID) | ORAL | Status: DC
  Administered 2011-06-11 – 2011-06-14 (×6): 1000 mg via ORAL
  Filled 2011-06-11 (×7): qty 2

## 2011-06-11 MED ORDER — INSULIN ASPART 100 UNIT/ML ~~LOC~~ SOLN
2.0000 [IU] | Freq: Three times a day (TID) | SUBCUTANEOUS | Status: DC
  Administered 2011-06-12: 8 [IU] via SUBCUTANEOUS
  Filled 2011-06-11: qty 3

## 2011-06-11 MED ORDER — SODIUM CHLORIDE 0.9 % IV BOLUS (SEPSIS)
500.0000 mL | Freq: Once | INTRAVENOUS | Status: AC
  Administered 2011-06-11: 500 mL via INTRAVENOUS

## 2011-06-11 MED ORDER — SIMVASTATIN 40 MG PO TABS: 40.0000 mg | ORAL_TABLET | Freq: Every day | ORAL | Status: DC

## 2011-06-11 MED ORDER — ENOXAPARIN SODIUM 30 MG/0.3ML ~~LOC~~ SOLN
30.0000 mg | SUBCUTANEOUS | Status: DC
  Administered 2011-06-11: 30 mg via SUBCUTANEOUS
  Filled 2011-06-11 (×2): qty 0.3

## 2011-06-11 MED ORDER — DOCUSATE SODIUM 100 MG PO CAPS
100.0000 mg | ORAL_CAPSULE | Freq: Two times a day (BID) | ORAL | Status: DC
  Administered 2011-06-11 – 2011-06-14 (×7): 100 mg via ORAL
  Filled 2011-06-11 (×7): qty 1

## 2011-06-11 MED ORDER — INSULIN GLARGINE 100 UNIT/ML ~~LOC~~ SOLN
20.0000 [IU] | Freq: Two times a day (BID) | SUBCUTANEOUS | Status: DC
  Administered 2011-06-11 – 2011-06-14 (×6): 20 [IU] via SUBCUTANEOUS
  Filled 2011-06-11: qty 3

## 2011-06-11 MED ORDER — SODIUM CHLORIDE 0.9 % IV SOLN
INTRAVENOUS | Status: DC
  Administered 2011-06-11 – 2011-06-13 (×6): via INTRAVENOUS

## 2011-06-11 MED ORDER — ONDANSETRON HCL 4 MG PO TABS
4.0000 mg | ORAL_TABLET | Freq: Four times a day (QID) | ORAL | Status: DC | PRN
  Administered 2011-06-13 – 2011-06-14 (×2): 4 mg via ORAL
  Filled 2011-06-11 (×2): qty 1

## 2011-06-11 MED ORDER — LACOSAMIDE 50 MG PO TABS
150.0000 mg | ORAL_TABLET | Freq: Two times a day (BID) | ORAL | Status: DC
Start: 2011-06-11 — End: 2011-06-14
  Administered 2011-06-11 – 2011-06-14 (×6): 150 mg via ORAL
  Filled 2011-06-11 (×5): qty 3

## 2011-06-11 MED ORDER — ACETAMINOPHEN 325 MG PO TABS: 650.0000 mg | ORAL_TABLET | Freq: Four times a day (QID) | ORAL | Status: DC | PRN

## 2011-06-11 MED ORDER — ROSUVASTATIN CALCIUM 10 MG PO TABS
10.0000 mg | ORAL_TABLET | Freq: Every day | ORAL | Status: DC
  Administered 2011-06-11 – 2011-06-13 (×3): 10 mg via ORAL
  Filled 2011-06-11 (×4): qty 1

## 2011-06-11 MED ORDER — SODIUM CHLORIDE 0.9 % IV SOLN: INTRAVENOUS | Status: DC

## 2011-06-11 MED ORDER — SERTRALINE HCL 100 MG PO TABS
100.0000 mg | ORAL_TABLET | Freq: Every day | ORAL | Status: DC
  Administered 2011-06-12 – 2011-06-14 (×3): 100 mg via ORAL
  Filled 2011-06-11 (×3): qty 1

## 2011-06-11 MED ORDER — DEXTROSE 5 % IV SOLN
1.0000 g | INTRAVENOUS | Status: DC
  Administered 2011-06-12 – 2011-06-13 (×2): 1 g via INTRAVENOUS
  Filled 2011-06-11 (×3): qty 10

## 2011-06-11 MED ORDER — ZOLPIDEM TARTRATE 5 MG PO TABS
5.0000 mg | ORAL_TABLET | Freq: Every evening | ORAL | Status: DC | PRN
  Administered 2011-06-11 – 2011-06-12 (×2): 5 mg via ORAL
  Filled 2011-06-11 (×2): qty 1

## 2011-06-11 MED ORDER — ONDANSETRON HCL 4 MG/2ML IJ SOLN
4.0000 mg | Freq: Four times a day (QID) | INTRAMUSCULAR | Status: DC | PRN
  Administered 2011-06-12 – 2011-06-13 (×2): 4 mg via INTRAVENOUS
  Filled 2011-06-11 (×2): qty 2

## 2011-06-11 MED ORDER — ACETAMINOPHEN 650 MG RE SUPP: 650.0000 mg | Freq: Four times a day (QID) | RECTAL | Status: DC | PRN

## 2011-06-11 MED ORDER — ALUM & MAG HYDROXIDE-SIMETH 200-200-20 MG/5ML PO SUSP: 30.0000 mL | Freq: Four times a day (QID) | ORAL | Status: DC | PRN

## 2011-06-11 MED ORDER — DIAZEPAM 5 MG PO TABS
5.0000 mg | ORAL_TABLET | Freq: Two times a day (BID) | ORAL | Status: DC
  Administered 2011-06-11 – 2011-06-14 (×6): 5 mg via ORAL
  Filled 2011-06-11 (×6): qty 1

## 2011-06-11 MED ORDER — DEXTROSE 5 % IV SOLN
1.0000 g | Freq: Once | INTRAVENOUS | Status: AC
  Administered 2011-06-11: 1 g via INTRAVENOUS
  Filled 2011-06-11: qty 10

## 2011-06-11 MED ORDER — AMLODIPINE BESYLATE 5 MG PO TABS
5.0000 mg | ORAL_TABLET | Freq: Two times a day (BID) | ORAL | Status: DC
  Administered 2011-06-11 – 2011-06-14 (×6): 5 mg via ORAL
  Filled 2011-06-11 (×7): qty 1

## 2011-06-11 NOTE — ED Provider Notes (Signed)
5:13 PM patient presents to the emergency room with complaint of generalized weakness and ataxic gait for the past week. Patient denies any other complaints. Patient had a intracranial bleed in September.   Results for orders placed during the hospital encounter of 06/11/11  CBC      Component Value Range   WBC 5.9  4.0 - 10.5 (K/uL)   RBC 3.51 (*) 3.87 - 5.11 (MIL/uL)   Hemoglobin 11.2 (*) 12.0 - 15.0 (g/dL)   HCT 57.8 (*) 46.9 - 46.0 (%)   MCV 88.3  78.0 - 100.0 (fL)   MCH 31.9  26.0 - 34.0 (pg)   MCHC 36.1 (*) 30.0 - 36.0 (g/dL)   RDW 62.9  52.8 - 41.3 (%)   Platelets 116 (*) 150 - 400 (K/uL)  DIFFERENTIAL      Component Value Range   Neutrophils Relative 71  43 - 77 (%)   Neutro Abs 4.2  1.7 - 7.7 (K/uL)   Lymphocytes Relative 22  12 - 46 (%)   Lymphs Abs 1.3  0.7 - 4.0 (K/uL)   Monocytes Relative 6  3 - 12 (%)   Monocytes Absolute 0.4  0.1 - 1.0 (K/uL)   Eosinophils Relative 0  0 - 5 (%)   Eosinophils Absolute 0.0  0.0 - 0.7 (K/uL)   Basophils Relative 0  0 - 1 (%)   Basophils Absolute 0.0  0.0 - 0.1 (K/uL)  COMPREHENSIVE METABOLIC PANEL      Component Value Range   Sodium 138  135 - 145 (mEq/L)   Potassium 5.5 (*) 3.5 - 5.1 (mEq/L)   Chloride 105  96 - 112 (mEq/L)   CO2 22  19 - 32 (mEq/L)   Glucose, Bld 138 (*) 70 - 99 (mg/dL)   BUN 22  6 - 23 (mg/dL)   Creatinine, Ser 2.44 (*) 0.50 - 1.10 (mg/dL)   Calcium 9.5  8.4 - 01.0 (mg/dL)   Total Protein 6.8  6.0 - 8.3 (g/dL)   Albumin 3.5  3.5 - 5.2 (g/dL)   AST 53 (*) 0 - 37 (U/L)   ALT 17  0 - 35 (U/L)   Alkaline Phosphatase 94  39 - 117 (U/L)   Total Bilirubin 0.4  0.3 - 1.2 (mg/dL)   GFR calc non Af Amer 36 (*) >90 (mL/min)   GFR calc Af Amer 41 (*) >90 (mL/min)  URINALYSIS, ROUTINE W REFLEX MICROSCOPIC      Component Value Range   Color, Urine ORANGE (*) YELLOW    APPearance CLEAR  CLEAR    Specific Gravity, Urine 1.017  1.005 - 1.030    pH 6.5  5.0 - 8.0    Glucose, UA NEGATIVE  NEGATIVE (mg/dL)   Hgb urine  dipstick NEGATIVE  NEGATIVE    Bilirubin Urine NEGATIVE  NEGATIVE    Ketones, ur NEGATIVE  NEGATIVE (mg/dL)   Protein, ur NEGATIVE  NEGATIVE (mg/dL)   Urobilinogen, UA 1.0  0.0 - 1.0 (mg/dL)   Nitrite POSITIVE (*) NEGATIVE    Leukocytes, UA LARGE (*) NEGATIVE   PHENYTOIN LEVEL, TOTAL      Component Value Range   Phenytoin Lvl 24.7 (*) 10.0 - 20.0 (ug/mL)  URINE MICROSCOPIC-ADD ON      Component Value Range   Squamous Epithelial / LPF FEW (*) RARE    WBC, UA 11-20  <3 (WBC/hpf)   Ct Head Wo Contrast  06/11/2011  *RADIOLOGY REPORT*  Clinical Data: Ataxia.  CT HEAD WITHOUT CONTRAST  Technique:  Contiguous axial images were obtained from the base of the skull through the vertex without contrast.  Comparison: PET CT 06/02/2011.  Findings: Stable surgical changes from bilateral craniotomies.  No extra-axial fluid collections are identified.  The ventricles are normal and stable.  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  No findings for acute hemispheric infarction or intracranial hemorrhage.  The brainstem and cerebellum appear normal and stable.  IMPRESSION: No acute intracranial findings or mass lesions.  Original Report Authenticated By: P. Loralie Champagne, M.D.   Patient seen and re-evaluated. Resting comfortably. VSS stable. NAD. Patient notified of testing results. Stated agreement and understanding. Patient stated understanding to treatment plan and diagnosis.   5:16 PM spoke to neurology, dr. Thad Ranger who is currently seeing the patient in the emergency department.   6:27 PM spoke to dr. Thad Ranger who stated that the ataxia could be due to the elevated dilantin level. Will consult.  6:27 PM spoke to [facility] hospitalist who will admit the patient.   Demetrius Charity, PA 06/11/11 1827

## 2011-06-11 NOTE — H&P (Signed)
Alexandra [name] is an 71 y.o. female.   Chief Complaint: weakness and unsteady gait HPI: the pt is a very independent female who normally lives by herself and functions well.  Today she became dizzy, weak and unsteady on her feet and came to the hospital to be evaluated.  The pt does have a history of intracranial bleed and therefore neurology was called and it was found that her dilantin level was slightly elevated therefore they will adjust that and will also treat her symptoms.  Also it turns out that the pt has a UTI and she will be treated for that as well  Past Medical History  Diagnosis Date  . HTN (hypertension)   . CAD (coronary artery disease)     Remote CABG in 2008  . DM (diabetes mellitus)   . HTN (hypertension)   . [place]-induced atrial fibrillation   . GERD (gastroesophageal reflux disease)   . Breast cancer   . Venous insufficiency   . Left bundle branch block   . Obesity   . Chronic anticoagulation     Past Surgical History  Procedure Date  . Partial hysterectomy     1985  . Total abdominal hysterectomy 1986  . Coronary artery bypass graft 2008    x3 2008.  [place] (Dr. Edrick Oh) [facility]. [phone]  . Cholecystectomy     Family History  Problem Relation Age of Onset  . Emphysema Father   . Hypertension Father   . Hypertension Mother   . Stroke Mother   . Diabetes Mother   . Diabetes Brother    Social History:  reports that she has never smoked. She has never used smokeless tobacco. She reports that she does not drink alcohol or use illicit drugs.  Allergies:  Allergies  Allergen Reactions  . Sulfa Antibiotics     Medications Prior to Admission  Medication Dose Route Frequency Provider Last Rate Last Dose  . 0.9 %  sodium chloride infusion   Intravenous Continuous Hurman Horn, MD 125 mL/hr at 06/11/11 1730    . cefTRIAXone (ROCEPHIN) 1 g in dextrose 5 % 50 mL IVPB  1 g Intravenous Once [employer], PA   1 g at 06/11/11 1729    . sodium chloride 0.9 % bolus 500 mL  500 mL Intravenous Once Hurman Horn, MD   500 mL at 06/11/11 1500   Medications Prior to Admission  Medication Sig Dispense Refill  . amLODipine (NORVASC) 5 MG tablet Take 5 mg by mouth daily.        . diazepam (VALIUM) 5 MG tablet Take 5 mg by mouth 2 (two) times daily.       . insulin aspart (NOVOLOG) 100 UNIT/ML injection Inject 2-10 Units into the skin 3 (three) times daily before meals. Sliding scale      . insulin glargine (LANTUS) 100 UNIT/ML injection Inject 20 Units into the skin 2 (two) times daily.       . Lacosamide (VIMPAT) 150 MG TABS Take 150 mg by mouth 2 (two) times daily.       Marland Kitchen levETIRAcetam (KEPPRA) 500 MG tablet Take 1,000 mg by mouth every 12 (twelve) hours.        . nebivolol 20 MG TABS Take 1 tablet (20 mg total) by mouth daily.  90 tablet  3  . phenytoin (DILANTIN) 100 MG ER capsule Take by mouth 3 (three) times daily.        . promethazine (PHENERGAN) 25 MG  tablet Take 25 mg by mouth every 6 (six) hours as needed.        . simvastatin (ZOCOR) 40 MG tablet Take 40 mg by mouth at bedtime.          Results for orders placed during the hospital encounter of 06/11/11 (from the past 48 hour(s))  CBC     Status: Abnormal   Collection Time   06/11/11  2:13 PM      Component Value Range Comment   WBC 5.9  4.0 - 10.5 (K/uL)    RBC 3.51 (*) 3.87 - 5.11 (MIL/uL)    Hemoglobin 11.2 (*) 12.0 - 15.0 (g/dL)    HCT 19.1 (*) 47.8 - 46.0 (%)    MCV 88.3  78.0 - 100.0 (fL)    MCH 31.9  26.0 - 34.0 (pg)    MCHC 36.1 (*) 30.0 - 36.0 (g/dL)    RDW 29.5  62.1 - 30.8 (%)    Platelets 116 (*) 150 - 400 (K/uL)   DIFFERENTIAL     Status: Normal   Collection Time   06/11/11  2:13 PM      Component Value Range Comment   Neutrophils Relative 71  43 - 77 (%)    Neutro Abs 4.2  1.7 - 7.7 (K/uL)    Lymphocytes Relative 22  12 - 46 (%)    Lymphs Abs 1.3  0.7 - 4.0 (K/uL)    Monocytes Relative 6  3 - 12 (%)    Monocytes Absolute 0.4  0.1 - 1.0  (K/uL)    Eosinophils Relative 0  0 - 5 (%)    Eosinophils Absolute 0.0  0.0 - 0.7 (K/uL)    Basophils Relative 0  0 - 1 (%)    Basophils Absolute 0.0  0.0 - 0.1 (K/uL)   COMPREHENSIVE METABOLIC PANEL     Status: Abnormal   Collection Time   06/11/11  2:13 PM      Component Value Range Comment   Sodium 138  135 - 145 (mEq/L)    Potassium 5.5 (*) 3.5 - 5.1 (mEq/L)    Chloride 105  96 - 112 (mEq/L)    CO2 22  19 - 32 (mEq/L)    Glucose, Bld 138 (*) 70 - 99 (mg/dL)    BUN 22  6 - 23 (mg/dL)    Creatinine, Ser 6.57 (*) 0.50 - 1.10 (mg/dL)    Calcium 9.5  8.4 - 10.5 (mg/dL)    Total Protein 6.8  6.0 - 8.3 (g/dL)    Albumin 3.5  3.5 - 5.2 (g/dL)    AST 53 (*) 0 - 37 (U/L)    ALT 17  0 - 35 (U/L)    Alkaline Phosphatase 94  39 - 117 (U/L)    Total Bilirubin 0.4  0.3 - 1.2 (mg/dL)    GFR calc non Af Amer 36 (*) >90 (mL/min)    GFR calc Af Amer 41 (*) >90 (mL/min)   PHENYTOIN LEVEL, TOTAL     Status: Abnormal   Collection Time   06/11/11  2:13 PM      Component Value Range Comment   Phenytoin Lvl 24.7 (*) 10.0 - 20.0 (ug/mL)   URINALYSIS, ROUTINE W REFLEX MICROSCOPIC     Status: Abnormal   Collection Time   06/11/11  3:13 PM      Component Value Range Comment   Color, Urine ORANGE (*) YELLOW  BIOCHEMICALS MAY BE AFFECTED BY COLOR   APPearance CLEAR  CLEAR  Specific Gravity, Urine 1.017  1.005 - 1.030     pH 6.5  5.0 - 8.0     Glucose, UA NEGATIVE  NEGATIVE (mg/dL)    Hgb urine dipstick NEGATIVE  NEGATIVE     Bilirubin Urine NEGATIVE  NEGATIVE     Ketones, ur NEGATIVE  NEGATIVE (mg/dL)    Protein, ur NEGATIVE  NEGATIVE (mg/dL)    Urobilinogen, UA 1.0  0.0 - 1.0 (mg/dL)    Nitrite POSITIVE (*) NEGATIVE     Leukocytes, UA LARGE (*) NEGATIVE    URINE MICROSCOPIC-ADD ON     Status: Abnormal   Collection Time   06/11/11  3:13 PM      Component Value Range Comment   Squamous Epithelial / LPF FEW (*) RARE     WBC, UA 11-20  <3 (WBC/hpf)   POTASSIUM     Status: Normal   Collection Time    06/11/11  5:24 PM      Component Value Range Comment   Potassium 4.1  3.5 - 5.1 (mEq/L) DELTA CHECK NOTED  GLUCOSE, CAPILLARY     Status: Normal   Collection Time   06/11/11  5:37 PM      Component Value Range Comment   Glucose-Capillary 88  70 - 99 (mg/dL)    Comment 1 Notify RN      Ct Head Wo Contrast  06/11/2011  *RADIOLOGY REPORT*  Clinical Data: Ataxia.  CT HEAD WITHOUT CONTRAST  Technique:  Contiguous axial images were obtained from the base of the skull through the vertex without contrast.  Comparison: PET CT 06/02/2011.  Findings: Stable surgical changes from bilateral craniotomies.  No extra-axial fluid collections are identified.  The ventricles are normal and stable.  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  No findings for acute hemispheric infarction or intracranial hemorrhage.  The brainstem and cerebellum appear normal and stable.  IMPRESSION: No acute intracranial findings or mass lesions.  Original Report Authenticated By: P. Loralie Champagne, M.D.    Review of Systems  Constitutional: Positive for malaise/fatigue. Negative for fever, chills, weight loss and diaphoresis.  HENT: Negative.   Eyes: Negative.   Respiratory: Negative.   Cardiovascular: Negative.   Gastrointestinal: Negative.   Genitourinary: Negative.   Musculoskeletal: Negative.   Skin: Negative.   Neurological: Positive for dizziness, focal weakness and weakness. Negative for tingling, tremors, sensory change, speech change, seizures and loss of consciousness.  Endo/Heme/Allergies: Negative.   Psychiatric/Behavioral: Negative.     Blood pressure 120/49, pulse 60, temperature 97.8 F (36.6 C), temperature source Oral, resp. rate 18, SpO2 99.00%. Physical Exam  Constitutional: She is oriented to person, place, and time. She appears well-developed and well-nourished. No distress.  HENT:  Head: Normocephalic and atraumatic.  Mouth/Throat: No  oropharyngeal exudate.  Eyes: Conjunctivae and EOM are normal. Pupils are equal, round, and reactive to light. Right eye exhibits no discharge. Left eye exhibits no discharge. No scleral icterus.  Neck: Normal range of motion. Neck supple. No JVD present. No tracheal deviation present. No thyromegaly present.  Cardiovascular: Normal rate, regular rhythm, normal heart sounds and intact distal pulses.  Exam reveals no gallop and no friction rub.   No murmur heard. Respiratory: Effort normal and breath sounds normal. No stridor. No respiratory distress. She has no wheezes. She has no rales. She exhibits no tenderness.  GI: Soft. Bowel sounds are normal. She exhibits no distension and no mass. There is no tenderness. There is no  rebound and no guarding.  Musculoskeletal: Normal range of motion. She exhibits no edema and no tenderness.  Lymphadenopathy:    She has no cervical adenopathy.  Neurological: She is alert and oriented to person, place, and time. She has normal reflexes. No cranial nerve deficit. Coordination normal.  Skin: Skin is warm and dry. No rash noted. She is not diaphoretic. No erythema. No pallor.  Psychiatric: She has a normal mood and affect. Her behavior is normal. Judgment and thought content normal.     Assessment/Plan 1.  UTI- start Rocephin 2.  Dilantin toxicity- which is mild yet significant, will treat 3.  Gait instability- the pt is most likely experiencing symptoms due to both of above, will get PT to eval  Freemon Binford 06/11/2011, 7:00 PM

## 2011-06-11 NOTE — ED Notes (Signed)
Pt checked her CBG with her machine - 60.  Pt noted to be anxious and shaking so food given prior to checking CBG with ED machine as per pt request.

## 2011-06-11 NOTE — Consult Note (Signed)
Reason for Consult:Gait ataxia Referring Physician: Fonnie Jarvis  CC: Difficulty with gait  HPI: Alexandra [name] is an 71 y.o. 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  Was on Dilantin and did well until about a week ago.  Noted at that time that she was having difficulty with gait.  This worsened today.  Patient has been on her current dose of Dilantin at least since the middle of November.  Has recently been diagnosed with a UTI and started on antibiotics (Amoxicillin) for the UTI about a week ago as well.  Past Medical History  Diagnosis Date  . HTN (hypertension)   . CAD (coronary artery disease)     Remote CABG in 2008  . DM (diabetes mellitus)   . HTN (hypertension)   . Campath-induced atrial fibrillation   . GERD (gastroesophageal reflux disease)   . Breast cancer   . Venous insufficiency   . Left bundle branch block   . Obesity   . Chronic anticoagulation     Past Surgical History  Procedure Date  . Partial hysterectomy     1985  . Total abdominal hysterectomy 1986  . Coronary artery bypass graft 2008    x3 2008.  [place] (Dr. Edrick Oh) [facility] [phone]  . Cholecystectomy     Family History  Problem Relation Age of Onset  . Emphysema Father   . Hypertension Father   . Hypertension Mother   . Stroke Mother   . Diabetes Mother   . Diabetes Brother     Social History:  reports that she has never smoked. She has never used smokeless tobacco. She reports that she does not drink alcohol or use illicit drugs.  Allergies  Allergen Reactions  . Sulfa Antibiotics     Medications: Prior to Admission:  Dilantin, Keppra, Vimpat, Norvasc, Amoxicillin, Insulin, Prevacid, Nebivolol, Zofran, Phenergan, Zoloft, Zocor  ROS: History obtained from the patient  General ROS: negative for - chills, fatigue, fever, night sweats, weight gain or weight loss Psychological ROS: negative for - behavioral disorder, hallucinations, memory difficulties, mood swings or  suicidal ideation Ophthalmic ROS: negative for - blurry vision, double vision, eye pain or loss of vision ENT ROS: negative for - epistaxis, nasal discharge, oral lesions, sore throat, tinnitus or vertigo Allergy and Immunology ROS: negative for - hives or itchy/watery eyes Hematological and Lymphatic ROS: negative for - bleeding problems, bruising or swollen lymph nodes Endocrine ROS: negative for - galactorrhea, hair pattern changes, polydipsia/polyuria or temperature intolerance Respiratory ROS: negative for - cough, hemoptysis, shortness of breath or wheezing Cardiovascular ROS: negative for - chest pain, dyspnea on exertion, edema or irregular heartbeat Gastrointestinal ROS: negative for - abdominal pain, diarrhea, hematemesis, nausea/vomiting or stool incontinence Genito-Urinary ROS: as noted in HPI Musculoskeletal ROS: negative for - joint swelling or muscular weakness Neurological ROS: as noted in HPI Dermatological ROS: negative for rash and skin lesion changes  Physical Examination: Blood pressure 140/61, pulse 73, temperature 97.8 F (36.6 C), temperature source Oral, resp. rate 18, SpO2 93.00%.  Neurologic Examination: Mental Status: Alert, oriented, thought content appropriate.  Speech fluent without evidence of aphasia.  Able to follow 3 step commands without difficulty. Cranial Nerves: II: visual fields grossly normal, pupils equal, round, reactive to light and accommodation III,IV, VI: ptosis not present, extra-ocular motions intact bilaterally V,VII: smile symmetric, facial light touch sensation normal bilaterally VIII: hearing normal bilaterally IX,X: gag reflex present XI: trapezius strength/neck flexion strength normal bilaterally XII: tongue strength normal  Motor:  Right : Upper extremity   5/5    Left:     Upper extremity   5/5  Lower extremity   5/5     Lower extremity   5/5 Tone and bulk:normal tone throughout; no atrophy noted Sensory: Pinprick and light  touch intact throughout, bilaterally Deep Tendon Reflexes: 2+ and symmetric throughout Plantars: Right: mute   Left: mute Cerebellar: normal finger-to-nose and normal heel-to-shin test. High frequency, low amplitude tremor noted in the BUE's.  Gait ataxic   Results for orders placed during the hospital encounter of 06/11/11 (from the past 48 hour(s))  CBC     Status: Abnormal   Collection Time   06/11/11  2:13 PM      Component Value Range Comment   WBC 5.9  4.0 - 10.5 (K/uL)    RBC 3.51 (*) 3.87 - 5.11 (MIL/uL)    Hemoglobin 11.2 (*) 12.0 - 15.0 (g/dL)    HCT 65.7 (*) 84.6 - 46.0 (%)    MCV 88.3  78.0 - 100.0 (fL)    MCH 31.9  26.0 - 34.0 (pg)    MCHC 36.1 (*) 30.0 - 36.0 (g/dL)    RDW 96.2  95.2 - 84.1 (%)    Platelets 116 (*) 150 - 400 (K/uL)   DIFFERENTIAL     Status: Normal   Collection Time   06/11/11  2:13 PM      Component Value Range Comment   Neutrophils Relative 71  43 - 77 (%)    Neutro Abs 4.2  1.7 - 7.7 (K/uL)    Lymphocytes Relative 22  12 - 46 (%)    Lymphs Abs 1.3  0.7 - 4.0 (K/uL)    Monocytes Relative 6  3 - 12 (%)    Monocytes Absolute 0.4  0.1 - 1.0 (K/uL)    Eosinophils Relative 0  0 - 5 (%)    Eosinophils Absolute 0.0  0.0 - 0.7 (K/uL)    Basophils Relative 0  0 - 1 (%)    Basophils Absolute 0.0  0.0 - 0.1 (K/uL)   COMPREHENSIVE METABOLIC PANEL     Status: Abnormal   Collection Time   06/11/11  2:13 PM      Component Value Range Comment   Sodium 138  135 - 145 (mEq/L)    Potassium 5.5 (*) 3.5 - 5.1 (mEq/L)    Chloride 105  96 - 112 (mEq/L)    CO2 22  19 - 32 (mEq/L)    Glucose, Bld 138 (*) 70 - 99 (mg/dL)    BUN 22  6 - 23 (mg/dL)    Creatinine, Ser 3.24 (*) 0.50 - 1.10 (mg/dL)    Calcium 9.5  8.4 - 10.5 (mg/dL)    Total Protein 6.8  6.0 - 8.3 (g/dL)    Albumin 3.5  3.5 - 5.2 (g/dL)    AST 53 (*) 0 - 37 (U/L)    ALT 17  0 - 35 (U/L)    Alkaline Phosphatase 94  39 - 117 (U/L)    Total Bilirubin 0.4  0.3 - 1.2 (mg/dL)    GFR calc non Af Amer 36 (*)  >90 (mL/min)    GFR calc Af Amer 41 (*) >90 (mL/min)   PHENYTOIN LEVEL, TOTAL     Status: Abnormal   Collection Time   06/11/11  2:13 PM      Component Value Range Comment   Phenytoin Lvl 24.7 (*) 10.0 - 20.0 (ug/mL)   URINALYSIS, ROUTINE W REFLEX MICROSCOPIC  Status: Abnormal   Collection Time   06/11/11  3:13 PM      Component Value Range Comment   Color, Urine ORANGE (*) YELLOW  BIOCHEMICALS MAY BE AFFECTED BY COLOR   APPearance CLEAR  CLEAR     Specific Gravity, Urine 1.017  1.005 - 1.030     pH 6.5  5.0 - 8.0     Glucose, UA NEGATIVE  NEGATIVE (mg/dL)    Hgb urine dipstick NEGATIVE  NEGATIVE     Bilirubin Urine NEGATIVE  NEGATIVE     Ketones, ur NEGATIVE  NEGATIVE (mg/dL)    Protein, ur NEGATIVE  NEGATIVE (mg/dL)    Urobilinogen, UA 1.0  0.0 - 1.0 (mg/dL)    Nitrite POSITIVE (*) NEGATIVE     Leukocytes, UA LARGE (*) NEGATIVE    URINE MICROSCOPIC-ADD ON     Status: Abnormal   Collection Time   06/11/11  3:13 PM      Component Value Range Comment   Squamous Epithelial / LPF FEW (*) RARE     WBC, UA 11-20  <3 (WBC/hpf)     Ct Head Wo Contrast  06/11/2011  *RADIOLOGY REPORT*  Clinical Data: Ataxia.  CT HEAD WITHOUT CONTRAST  Technique:  Contiguous axial images were obtained from the base of the skull through the vertex without contrast.  Comparison: PET CT 06/02/2011.  Findings: Stable surgical changes from bilateral craniotomies.  No extra-axial fluid collections are identified.  The ventricles are normal and stable.  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  No findings for acute hemispheric infarction or intracranial hemorrhage.  The brainstem and cerebellum appear normal and stable.  IMPRESSION: No acute intracranial findings or mass lesions.  Original Report Authenticated By: P. Loralie Champagne, M.D.     Assessment/Plan:  Patient Active Hospital Problem List: Gait Ataxia   Assessment:  Patient with onset of ataxia  about the same time that her additional medication was started.  Dilantin level supratherapeutic.  Head CT shows no acute changes.  Gait likely secondary to Dilantin.  Dilantin levels often affected with polypharmacy.   Plan: 1. Hold Dilantin  2. Dilantin level daily until therapeutic  3. Would restart at pharmacy suggestion at that time with pharmacy to manage dosage.   Thana Farr, MD [facility] [phone] 06/11/2011, 5:32 PM

## 2011-06-11 NOTE — ED Notes (Signed)
Pt. Stated, I've got aches all over and I've have fallen 2 times in the last week

## 2011-06-11 NOTE — ED Provider Notes (Signed)
Medical screening examination/treatment/procedure(s) were conducted as a shared visit with non-physician practitioner(s) and myself.  I personally evaluated the patient during the encounter  Hurman Horn, MD 06/11/11 2245

## 2011-06-11 NOTE — ED Provider Notes (Signed)
History     CSN: [id number]  Arrival date & time 06/11/11  1102   First MD Initiated Contact with Patient 06/11/11 1358      Chief Complaint  Patient presents with  . Generalized Body Aches  Abnormal gait  (Consider location/radiation/quality/duration/timing/severity/associated sxs/prior treatment) HPI This 71 year old white female has a history of atrial fibrillation but is not on Coumadin to 2 history of subdural hematomas. In the fall of 2012 she had bilateral craniotomies and seizures and was discharged to a nursing home. She now is back at her own home with family but she is alone most of the time. A followup CT scan of her head within the last 2 weeks apparently was unremarkable. Patient has had no headache and no further falls. She's had no head injuries. She has no lightheadedness chest pain palpitations cough or shortness of breath. She presents with a one-week history of constant gradual onsets generalized weakness with gait ataxia. She now cannot walk unassisted for one to 2 steps which is totally different than she was a week ago. She does not have a feeling of vertigo but whenever she tries to walk and that is when she feels unsteady and cannot walk unassisted. She almost fell twice but did not actually fall. She has had occasional nausea since her head injuries but that is unchanged. She is no abdominal pain vomiting diarrhea or bloody stools. There's been no treatment prior to arrival. Her only new symptom is a feeling of generalized weakness constant for one week which is really only felt when she tries to walk with an abnormal gait. She has to hang onto objects to steady herself otherwise cannot walk unassisted. Past Medical History  Diagnosis Date  . HTN (hypertension)   . CAD (coronary artery disease)     Remote CABG in 2008  . DM (diabetes mellitus)   . HTN (hypertension)   . Campath-induced atrial fibrillation   . GERD (gastroesophageal reflux disease)   . Breast cancer    . Venous insufficiency   . Left bundle branch block   . Obesity   . Chronic anticoagulation     Past Surgical History  Procedure Date  . Partial hysterectomy     1985  . Total abdominal hysterectomy 1986  . Coronary artery bypass graft 2008    x3 2008.  [place] (Dr[facility] Ed[facility] [phone]  . Cholecystectomy     Family History  Problem Relation Age of Onset  . Emphysema Father   . Hypertension Father   . Hypertension Mother   . Stroke Mother   . Diabetes Mother   . Diabetes Brother     History  Substance Use Topics  . Smoking status: Never Smoker   . Smokeless tobacco: Never Used  . Alcohol Use: No    OB History    Grav Para Term Preterm Abortions TAB SAB Ect Mult Living                  Review of Systems  Constitutional: Negative for fever.       10 Systems reviewed and are negative for acute change except as noted in the HPI.  HENT: Negative for congestion.   Eyes: Negative for discharge and redness.  Respiratory: Negative for cough and shortness of breath.   Cardiovascular: Negative for chest pain.  Gastrointestinal: Positive for nausea. Negative for vomiting and abdominal pain.  Musculoskeletal: Negative for back pain.  Skin: Negative for rash.  Neurological: Positive for weakness.  Negative for dizziness, seizures, syncope, speech difficulty, light-headedness, numbness and headaches.  Psychiatric/Behavioral:       No behavior change.    Allergies  Sulfa antibiotics  Home Medications   No current outpatient prescriptions on file.  BP 134/69  Pulse 71  Temp(Src) 97.9 F (36.6 C) (Oral)  Resp 18  Ht 5\' 3"  (1.6 m)  Wt 182 lb 8.7 oz (82.8 kg)  BMI 32.34 kg/m2  SpO2 97%  Physical Exam  Nursing note and vitals reviewed. Constitutional:       Awake, alert, nontoxic appearance with baseline speech for patient.  HENT:  Head: Atraumatic.  Mouth/Throat: No oropharyngeal exudate.  Eyes: EOM are normal. Pupils are equal,  round, and reactive to light. Right eye exhibits no discharge. Left eye exhibits no discharge.  Neck: Neck supple.  Cardiovascular: Normal rate and regular rhythm.   No murmur heard. Pulmonary/Chest: Effort normal and breath sounds normal. No stridor. No respiratory distress. She has no wheezes. She has no rales. She exhibits no tenderness.  Abdominal: Soft. Bowel sounds are normal. She exhibits no mass. There is no tenderness. There is no rebound.  Musculoskeletal: She exhibits no edema and no tenderness.       Baseline ROM, moves extremities with no obvious new focal weakness.  Lymphadenopathy:    She has no cervical adenopathy.  Neurological: She is alert.       Awake, alert, cooperative and aware of situation; motor strength bilaterally; sensation normal to light touch bilaterally; peripheral visual fields full to confrontation; no facial asymmetry; tongue midline; major cranial nerves appear intact; no pronator drift, normal finger to nose bilaterally, gait with ataxia and cannot walk unassisted  Skin: No rash noted.  Psychiatric: She has a normal mood and affect.    ED Course  Procedures (including critical care time) The patient will be moved to the clinical decision unit as a shared visit with the physician's assistant.  ECG: Sinus rhythm, ventricular rate 68, normal axis, left bundle branch block, no significant change compared with October 2012 Labs Reviewed  CBC - Abnormal; Notable for the following:    RBC 3.51 (*)    Hemoglobin 11.2 (*)    HCT 31.0 (*)    MCHC 36.1 (*)    Platelets 116 (*)    All other components within normal limits  COMPREHENSIVE METABOLIC PANEL - Abnormal; Notable for the following:    Potassium 5.5 (*)    Glucose, Bld 138 (*)    Creatinine, Ser 1.45 (*)    AST 53 (*)    GFR calc non Af Amer 36 (*)    GFR calc Af Amer 41 (*)    All other components within normal limits  URINALYSIS, ROUTINE W REFLEX MICROSCOPIC - Abnormal; Notable for the  following:    Color, Urine ORANGE (*) BIOCHEMICALS MAY BE AFFECTED BY COLOR   Nitrite POSITIVE (*)    Leukocytes, UA LARGE (*)    All other components within normal limits  PHENYTOIN LEVEL, TOTAL - Abnormal; Notable for the following:    Phenytoin Lvl 24.7 (*)    All other components within normal limits  URINE MICROSCOPIC-ADD ON - Abnormal; Notable for the following:    Squamous Epithelial / LPF FEW (*)    All other components within normal limits  GLUCOSE, CAPILLARY - Abnormal; Notable for the following:    Glucose-Capillary 174 (*)    All other components within normal limits  DIFFERENTIAL  POTASSIUM  GLUCOSE, CAPILLARY  I-STAT TROPONIN I  POCT CBG MONITORING  CBC  CREATININE, SERUM  COMPREHENSIVE METABOLIC PANEL  CBC  TSH  PHENYTOIN LEVEL, TOTAL   Ct Head Wo Contrast  06/11/2011  *RADIOLOGY REPORT*  Clinical Data: Ataxia.  CT HEAD WITHOUT CONTRAST  Technique:  Contiguous axial images were obtained from the base of the skull through the vertex without contrast.  Comparison: PET CT 06/02/2011.  Findings: Stable surgical changes from bilateral craniotomies.  No extra-axial fluid collections are identified.  The ventricles are normal and stable.  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  No findings for acute hemispheric infarction or intracranial hemorrhage.  The brainstem and cerebellum appear normal and stable.  IMPRESSION: No acute intracranial findings or mass lesions.  Original Report Authenticated By: P. Loralie Champagne, M.D.    Dx: Gait ataxia Dilantin toxicity UTI    MDM  Medical screening examination/treatment/procedure(s) were conducted as a shared visit with non-physician practitioner(s) and myself.  I personally evaluated the patient during the encounter  Pt admitted to Med after Neuro consulted.        Hurman Horn, MD 06/11/11 [phone]

## 2011-06-12 LAB — GLUCOSE, CAPILLARY: Glucose-Capillary: 273 mg/dL — ABNORMAL HIGH (ref 70–99)

## 2011-06-12 LAB — CBC
Hemoglobin: 10.1 g/dL — ABNORMAL LOW (ref 12.0–15.0)
MCH: 31.9 pg (ref 26.0–34.0)
MCHC: 35.6 g/dL (ref 30.0–36.0)
RDW: 12.3 % (ref 11.5–15.5)

## 2011-06-12 LAB — COMPREHENSIVE METABOLIC PANEL
ALT: 12 U/L (ref 0–35)
AST: 14 U/L (ref 0–37)
Alkaline Phosphatase: 101 U/L (ref 39–117)
CO2: 24 mEq/L (ref 19–32)
Calcium: 8.9 mg/dL (ref 8.4–10.5)
Chloride: 107 mEq/L (ref 96–112)
GFR calc Af Amer: 50 mL/min — ABNORMAL LOW (ref >90)
GFR calc non Af Amer: 43 mL/min — ABNORMAL LOW (ref >90)
Glucose, Bld: 203 mg/dL — ABNORMAL HIGH (ref 70–99)
Potassium: 3.9 mEq/L (ref 3.5–5.1)
Sodium: 140 mEq/L (ref 135–145)

## 2011-06-12 LAB — PHENYTOIN LEVEL, TOTAL: Phenytoin Lvl: 13.6 ug/mL (ref 10.0–20.0)

## 2011-06-12 MED ORDER — BISACODYL 10 MG RE SUPP
10.0000 mg | Freq: Once | RECTAL | Status: AC
  Administered 2011-06-12: 10 mg via RECTAL
  Filled 2011-06-12: qty 1

## 2011-06-12 MED ORDER — ENOXAPARIN SODIUM 40 MG/0.4ML ~~LOC~~ SOLN
40.0000 mg | SUBCUTANEOUS | Status: DC
  Filled 2011-06-12 (×3): qty 0.4

## 2011-06-12 MED ORDER — FLEET ENEMA 7-19 GM/118ML RE ENEM
1.0000 | ENEMA | Freq: Once | RECTAL | Status: AC
  Administered 2011-06-12: 1 via RECTAL
  Filled 2011-06-12 (×2): qty 1

## 2011-06-12 MED ORDER — INSULIN ASPART 100 UNIT/ML ~~LOC~~ SOLN
0.0000 [IU] | Freq: Three times a day (TID) | SUBCUTANEOUS | Status: DC
  Administered 2011-06-12 (×2): 8 [IU] via SUBCUTANEOUS
  Administered 2011-06-12 – 2011-06-13 (×2): 2 [IU] via SUBCUTANEOUS
  Administered 2011-06-13: 5 [IU] via SUBCUTANEOUS
  Administered 2011-06-13 – 2011-06-14 (×3): 3 [IU] via SUBCUTANEOUS

## 2011-06-12 MED ORDER — PHENYTOIN SODIUM EXTENDED 100 MG PO CAPS
200.0000 mg | ORAL_CAPSULE | Freq: Every day | ORAL | Status: DC
  Administered 2011-06-13 – 2011-06-14 (×2): 200 mg via ORAL
  Filled 2011-06-12 (×2): qty 2

## 2011-06-12 NOTE — Progress Notes (Signed)
MEDICATION RELATED CONSULT NOTE - INITIAL   Pharmacy Consult for Dilantin Indication: h/o seizure  Allergies  Allergen Reactions  . Sulfa Antibiotics     Patient Measurements: Height: 5\' 3"  (160 cm) Weight: 182 lb 1.6 oz (82.6 kg) IBW/kg (Calculated) : 52.4   Vital Signs: Temp: 98 F (36.7 C) (01/06 0525) BP: 154/66 mmHg (01/06 0525) Pulse Rate: 73  (01/06 0525) Intake/Output from previous day: 01/05 0701 - 01/06 0700 In: 918.8 [I.V.:918.8] Out: -  Intake/Output from this shift: Total I/O In: 120 [P.O.:120] Out: 400 [Urine:400]  Labs:  [facility] 06/12/11 0808 06/11/11 1413  WBC 3.7* 5.9  HGB 10.1* 11.2*  HCT 28.4* 31.0*  PLT 111* 116*  APTT -- --  CREATININE 1.24* 1.45*  LABCREA -- --  CREATININE 1.24* 1.45*  CREAT24HRUR -- --  MG -- --  PHOS -- --  ALBUMIN 3.0* 3.5  PROT 5.8* 6.8  ALBUMIN 3.0* 3.5  AST 14 53*  ALT 12 17  ALKPHOS 101 94  BILITOT 0.2* 0.4  BILIDIR -- --  IBILI -- --   Estimated Creatinine Clearance: 43 ml/min (by C-G formula based on Cr of 1.24).   Microbiology: No results found for this or any previous visit (from the past 720 hour(s)).  Medical History: Past Medical History  Diagnosis Date  . HTN (hypertension)   . CAD (coronary artery disease)     Remote CABG in 2008  . DM (diabetes mellitus)   . HTN (hypertension)   . Campath-induced atrial fibrillation   . GERD (gastroesophageal reflux disease)   . Breast cancer   . Venous insufficiency   . Left bundle branch block   . Obesity   . Chronic anticoagulation     Medications:  Prescriptions prior to admission  Medication Sig Dispense Refill  . amLODipine (NORVASC) 5 MG tablet Take 5 mg by mouth daily.        Marland Kitchen amoxicillin (AMOXIL) 500 MG tablet Take 500 mg by mouth 3 (three) times daily.        . diazepam (VALIUM) 5 MG tablet Take 5 mg by mouth 2 (two) times daily.       . insulin aspart (NOVOLOG) 100 UNIT/ML injection Inject 2-10 Units into the skin 3 (three) times  daily before meals. Sliding scale      . insulin glargine (LANTUS) 100 UNIT/ML injection Inject 20 Units into the skin 2 (two) times daily.       . Lacosamide (VIMPAT) 150 MG TABS Take 150 mg by mouth 2 (two) times daily.       . lansoprazole (PREVACID) 30 MG capsule Take 30 mg by mouth daily.        Marland Kitchen levETIRAcetam (KEPPRA) 500 MG tablet Take 1,000 mg by mouth every 12 (twelve) hours.        . nebivolol 20 MG TABS Take 1 tablet (20 mg total) by mouth daily.  90 tablet  3  . omeprazole (PRILOSEC) 40 MG capsule Take 40 mg by mouth 2 (two) times daily.        . ondansetron (ZOFRAN) 8 MG tablet Take 8 mg by mouth every 8 (eight) hours as needed. nausea       . phenytoin (DILANTIN) 100 MG ER capsule Take by mouth 3 (three) times daily.        . phenytoin (DILANTIN) 50 MG tablet Chew 150 mg by mouth 3 (three) times daily.        . promethazine (PHENERGAN) 25 MG tablet Take 25 mg  by mouth every 6 (six) hours as needed.        . sertraline (ZOLOFT) 100 MG tablet Take 100 mg by mouth daily.        . simvastatin (ZOCOR) 40 MG tablet Take 40 mg by mouth at bedtime.          Assessment: 9 female patient admitted with Dilantin toxicity. Takes total of 150mg  tid pta for h/o seizures d/t ICH. Dilantin has been held and today level =13.6 which corrects to level of 19.6, which is therapeutic. Will plan dose reduction and continue to monitor. Impaired renal fxn can contribute to dilantin accumulation. No seizures reported.  Goal of Therapy:  Dilantin trough 10-20   Plan:  Resume dilantin in am with dose of 200mg  daily. Will continue to follow and monitor drug levels.  Verlene Mayer, PharmD, BCPS Pager [phone] 06/12/2011,12:02 PM

## 2011-06-12 NOTE — Progress Notes (Signed)
Physical Therapy Evaluation Patient Details Name: Alexandra [name] MRN: [id number] DOB: 01-08-1941 Today's Date: 06/12/2011  Problem List:  Patient Active Problem List  Diagnoses  . HTN (hypertension)  . CAD (coronary artery disease)  . Atrial fibrillation  . Obesity  . Subdural hematoma    Past Medical History:  Past Medical History  Diagnosis Date  . HTN (hypertension)   . CAD (coronary artery disease)     Remote CABG in 2008  . DM (diabetes mellitus)   . HTN (hypertension)   . [place]-induced atrial fibrillation   . GERD (gastroesophageal reflux disease)   . Breast cancer   . Venous insufficiency   . Left bundle branch block   . Obesity   . Chronic anticoagulation    Past Surgical History:  Past Surgical History  Procedure Date  . Partial hysterectomy     1985  . Total abdominal hysterectomy 1986  . Coronary artery bypass graft 2008    x3 2008.  [place] (Dr. Edrick Oh) [facility] [phone]  . Cholecystectomy     PT Assessment/Plan/Recommendation PT Assessment Clinical Impression Statement: 71 yo female admitted with dilantin toxicity, difficulty walking presents with slightly decr mobility and impairmants listed below; will benefit from PT to maximize Independence and safety with mobility to enable safe dc home PT Recommendation/Assessment: Patient will need skilled PT in the acute care venue PT Problem List: Decreased strength;Decreased mobility;Decreased knowledge of use of DME;Decreased activity tolerance PT Therapy Diagnosis : Difficulty walking;Generalized weakness PT Plan PT Frequency: Min 3X/week PT Treatment/Interventions: DME instruction;Gait training;Stair training;Functional mobility training;Therapeutic activities;Therapeutic exercise;Patient/family education PT Recommendation Follow Up Recommendations: Home health PT (may progress to not needing HHPT) Equipment Recommended: None recommended by PT PT Goals  Acute Rehab PT Goals PT Goal  Formulation: With patient Time For Goal Achievement: 7 days Pt will go Supine/Side to Sit: with modified independence PT Goal: Supine/Side to Sit - Progress: Other (comment) Pt will go Sit to Supine/Side: with modified independence PT Goal: Sit to Supine/Side - Progress: Other (comment) Pt will go Sit to Stand: with modified independence PT Goal: Sit to Stand - Progress: Other (comment) Pt will go Stand to Sit: with modified independence PT Goal: Stand to Sit - Progress: Other (comment) Pt will Transfer Bed to Chair/Chair to Bed: with modified independence PT Transfer Goal: Bed to Chair/Chair to Bed - Progress: Other (comment) Pt will Ambulate: >150 feet;with modified independence;with least restrictive assistive device (versus none) PT Goal: Ambulate - Progress: Other (comment) Pt will Go Up / Down Stairs: 3-5 stairs;with rail(s);with modified independence PT Goal: Up/Down Stairs - Progress: Other (comment)  PT Evaluation Precautions/Restrictions    Prior Functioning  Home Living Lives With: Family (brother) Receives Help From: Family Type of Home: Mobile home Home Layout: One level Home Access: Stairs to enter Entrance Stairs-Rails: Right;Left (tends to use L rail only) Entrance Stairs-Number of Steps: 3 Bathroom Shower/Tub: Walk-in shower Home Adaptive Equipment: Walker - rolling (has shower chair) Prior Function Level of Independence: Independent with basic ADLs;Independent with gait (RW prn) Able to Take Stairs?: Yes Driving: No Cognition Cognition Arousal/Alertness: Awake/alert Overall Cognitive Status: Appears within functional limits for tasks assessed Orientation Level: Oriented X4 Sensation/Coordination Sensation Light Touch: Appears Intact Coordination Gross Motor Movements are Fluid and Coordinated: Yes Fine Motor Movements are Fluid and Coordinated: Not tested Extremity Assessment RUE Assessment RUE Assessment: Within Functional Limits LUE Assessment LUE  Assessment: Within Functional Limits RLE Assessment RLE Assessment: Exceptions to [facility] RLE Strength RLE Overall Strength Comments:  some generalized weakness, with dependence on UE assist LLE Assessment LLE Assessment: Exceptions to [facility] LLE Strength LLE Overall Strength Comments: some generalized weakness, with dependence on UE assist Mobility (including Balance) Transfers Transfers: Yes Sit to Stand: 4: Min assist Sit to Stand Details (indicate cue type and reason): cues to initiate Stand to Sit: 4: Min assist Stand to Sit Details: cues to control descent Ambulation/Gait Ambulation/Gait: Yes Ambulation/Gait Assistance: 4: Min assist (without physical contact) Ambulation/Gait Assistance Details (indicate cue type and reason): adjusted RW for proper fit Ambulation Distance (Feet): 200 Feet Assistive device: Rolling walker Gait Pattern: Within Functional Limits       End of Session PT - End of Session Activity Tolerance: Patient tolerated treatment well Patient left: with call bell in reach (on [facility]) Nurse Communication: Mobility status for transfers;Mobility status for ambulation General Behavior During Session: [facility] for tasks performed Cognition: [facility] for tasks performed  Van Clines [facility], [place] [phone]  06/12/2011, 1:15 PM

## 2011-06-12 NOTE — Progress Notes (Signed)
1800 Pt had no results from suppository, notified MD on call for fleets enema order.  Bonney Leitz RN

## 2011-06-12 NOTE — Progress Notes (Signed)
Pt admitted with c/o weakness of BLE and UTI, already being treated by PO PCN.  She is dx with dilantin toxicity.  She is A&Ox3, ambulatory with 1 assist, in NAD.  She has a recent hx of an intracranial bleed and MD has ordered a stroke swallow screen, however, pt is not being worked up for CVA this admission, therefore RN's are not allowed to do this.  MD on call made aware, and pt swallowing with no problems.    Pt recently on coumadin, but was d/c'd due to intracranial bleed and questions Lovenox, she already has orders for SCDs.  MD-please address.

## 2011-06-12 NOTE — Progress Notes (Addendum)
Subjective: Patient seen and examiend this am. Feels better today but tearful for being in the hospital  Objective:  Vital signs in last 24 hours:  Filed Vitals:   06/11/11 2131 06/11/11 2132 06/12/11 0525 06/12/11 1420  BP: 131/71 134/69 154/66 160/71  Pulse: 72 71 73 68  Temp:   98 F (36.7 C) 98.2 F (36.8 C)  TempSrc:      Resp:   20 18  Height:      Weight:   82.6 kg (182 lb 1.6 oz)   SpO2:   95% 94%    Intake/Output from previous day:   Intake/Output Summary (Last 24 hours) at 06/12/11 1645 Last data filed at 06/12/11 1420  Gross per 24 hour  Intake 1278.75 ml  Output    550 ml  Net 728.75 ml    Physical Exam:  General: elderly female in no acute distress. HEENT: no pallor, no icterus, moist oral mucosa, no JVD, no lymphadenopathy Heart: Normal  s1 &s2  Regular rate and rhythm, without murmurs, rubs, gallops. Lungs: Clear to auscultation bilaterally. Abdomen: Soft, nontender, nondistended, positive bowel sounds. Extremities: No clubbing cyanosis or edema with positive pedal pulses. Neuro: Alert, awake, oriented x3, nonfocal. Gait steady   Lab Results:  Basic Metabolic Panel:    Component Value Date/Time   NA 140 06/12/2011 0808   K 3.9 06/12/2011 0808   CL 107 06/12/2011 0808   CO2 24 06/12/2011 0808   BUN 18 06/12/2011 0808   CREATININE 1.24* 06/12/2011 0808   GLUCOSE 203* 06/12/2011 0808   CALCIUM 8.9 06/12/2011 0808   CBC:    Component Value Date/Time   WBC 3.7* 06/12/2011 0808   HGB 10.1* 06/12/2011 0808   HCT 28.4* 06/12/2011 0808   PLT 111* 06/12/2011 0808   MCV 89.6 06/12/2011 0808   NEUTROABS 4.2 06/11/2011 1413   LYMPHSABS 1.3 06/11/2011 1413   MONOABS 0.4 06/11/2011 1413   EOSABS 0.0 06/11/2011 1413   BASOSABS 0.0 06/11/2011 1413    No results found for this or any previous visit (from the past 240 hour(s)).  Studies/Results: Ct Head Wo Contrast  06/11/2011  *RADIOLOGY REPORT*  Clinical Data: Ataxia.  CT HEAD WITHOUT CONTRAST  Technique:  Contiguous axial images  were obtained from the base of the skull through the vertex without contrast.  Comparison: PET CT 06/02/2011.  Findings: Stable surgical changes from bilateral craniotomies.  No extra-axial fluid collections are identified.  The ventricles are normal and stable.  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  No findings for acute hemispheric infarction or intracranial hemorrhage.  The brainstem and cerebellum appear normal and stable.  IMPRESSION: No acute intracranial findings or mass lesions.  Original Report Authenticated By: P. Loralie Champagne, M.D.    Medications: Scheduled Meds:   . amLODipine  5 mg Oral BID  . bisacodyl  10 mg Rectal Once  . cefTRIAXone (ROCEPHIN)  IV  1 g Intravenous Once  . cefTRIAXone (ROCEPHIN)  IV  1 g Intravenous Q24H  . diazepam  5 mg Oral BID  . docusate sodium  100 mg Oral BID  . enoxaparin (LOVENOX) injection  40 mg Subcutaneous Q24H  . insulin aspart  0-15 Units Subcutaneous TID WC  . insulin aspart  2-10 Units Subcutaneous TID AC  . insulin glargine  20 Units Subcutaneous BID  . lacosamide  150 mg Oral BID  . levETIRAcetam  1,000 mg Oral Q12H  . nebivolol  20 mg  Oral Daily  . pantoprazole  20 mg Oral Q1200  . phenytoin  200 mg Oral Daily  . rosuvastatin  10 mg Oral QHS  . sertraline  100 mg Oral Daily  . DISCONTD: enoxaparin  30 mg Subcutaneous Q24H  . DISCONTD: simvastatin  40 mg Oral QHS   Continuous Infusions:   . sodium chloride 125 mL/hr at 06/12/11 0621  . DISCONTD: sodium chloride     PRN Meds:.acetaminophen, acetaminophen, alum & mag hydroxide-simeth, ondansetron (ZOFRAN) IV, ondansetron, zolpidem  Assessment  71 y/o female with hx of  HTN, CAD, DM, AFIB, GERD, hx of ICH on dilantin and keppra presented with unsteady gait.  Plan:  Unsteady gait with dilantin toxicity  patient noted for supratherapeutic dilantin level Dilantin  held and her symptoms now improving tolerated PT  well today Level therapeutic today and as per pharmacy recs will resume in am with 200 mg daily Cont keppra  UTI  started on ceftriaxone on admission  anxiety  cont valium prn  HTN Cont home BP meds  DM:  cont home lantus and SSI   CAD  cotn bystolic, statin  AKI  mild , monitor in am  Anemia and chronic mild thrombocytopenia Stable   Full code   LOS: 1 day   Kaveon Blatz 06/12/2011, 4:45 PM

## 2011-06-12 NOTE — Progress Notes (Signed)
0800  MD on call notified for range value of SSI clarification.  Novolog held for now till instructions, CBG 169 however pt is nauseated and taking sips only of flds.  Bonney Leitz RN

## 2011-06-13 LAB — BASIC METABOLIC PANEL
BUN: 16 mg/dL (ref 6–23)
Chloride: 110 mEq/L (ref 96–112)
Creatinine, Ser: 1.08 mg/dL (ref 0.50–1.10)
Glucose, Bld: 156 mg/dL — ABNORMAL HIGH (ref 70–99)
Potassium: 3.6 mEq/L (ref 3.5–5.1)

## 2011-06-13 LAB — GLUCOSE, CAPILLARY
Glucose-Capillary: 229 mg/dL — ABNORMAL HIGH (ref 70–99)
Glucose-Capillary: 167 mg/dL — ABNORMAL HIGH (ref 70–99)
Glucose-Capillary: 188 mg/dL — ABNORMAL HIGH (ref 70–99)
Glucose-Capillary: 130 mg/dL — ABNORMAL HIGH (ref 70–99)

## 2011-06-13 LAB — CBC
HCT: 26.6 % — ABNORMAL LOW (ref 36.0–46.0)
Hemoglobin: 9.4 g/dL — ABNORMAL LOW (ref 12.0–15.0)
MCHC: 35.3 g/dL (ref 30.0–36.0)
MCV: 89.3 fL (ref 78.0–100.0)
WBC: 3.7 10*3/uL — ABNORMAL LOW (ref 4.0–10.5)

## 2011-06-13 MED ORDER — PROMETHAZINE HCL 25 MG/ML IJ SOLN
12.5000 mg | Freq: Four times a day (QID) | INTRAMUSCULAR | Status: DC | PRN
  Administered 2011-06-13: 12.5 mg via INTRAVENOUS
  Filled 2011-06-13: qty 1

## 2011-06-13 NOTE — Progress Notes (Signed)
Soap suds enema given. Pt tolerated well. Pt was able to pass 5 small, hard stools. Pt still feels "clogged up." Will continue to monitor.

## 2011-06-13 NOTE — Progress Notes (Signed)
Subjective: Patient seen and examined this am. Feels weak and tearful with constipation . also was nauseous this am  Objective:  Vital signs in last 24 hours:  Filed Vitals:   06/13/11 0520 06/13/11 0625 06/13/11 1042 06/13/11 1347  BP: 176/80 139/70 174/89 143/66  Pulse: 67 73  80  Temp: 98.2 F (36.8 C)   98.5 F (36.9 C)  TempSrc:    Oral  Resp: 18   20  Height:      Weight: 83 kg (182 lb 15.7 oz)     SpO2: 99%   96%    Intake/Output from previous day:   Intake/Output Summary (Last 24 hours) at 06/13/11 1649 Last data filed at 06/13/11 1300  Gross per 24 hour  Intake 1906.33 ml  Output      0 ml  Net 1906.33 ml    Physical Exam:  General: elderly female in no acute distress.  HEENT: no pallor, no icterus, moist oral mucosa, no JVD, no lymphadenopathy  Heart: Normal s1 &s2 Regular rate and rhythm, without murmurs, rubs, gallops.  Lungs: Clear to auscultation bilaterally.  Abdomen: Soft, nontender, nondistended, positive bowel sounds.  Extremities: No clubbing cyanosis or edema with positive pedal pulses.  Neuro: Alert, awake, oriented x3, nonfocal. Gait steady   Lab Results:  Basic Metabolic Panel:    Component Value Date/Time   NA 142 06/13/2011 0607   K 3.6 06/13/2011 0607   CL 110 06/13/2011 0607   CO2 26 06/13/2011 0607   BUN 16 06/13/2011 0607   CREATININE 1.08 06/13/2011 0607   GLUCOSE 156* 06/13/2011 0607   CALCIUM 8.5 06/13/2011 0607   CBC:    Component Value Date/Time   WBC 3.7* 06/13/2011 0607   HGB 9.4* 06/13/2011 0607   HCT 26.6* 06/13/2011 0607   PLT 104* 06/13/2011 0607   MCV 89.3 06/13/2011 0607   NEUTROABS 4.2 06/11/2011 1413   LYMPHSABS 1.3 06/11/2011 1413   MONOABS 0.4 06/11/2011 1413   EOSABS 0.0 06/11/2011 1413   BASOSABS 0.0 06/11/2011 1413    No results found for this or any previous visit (from the past 240 hour(s)).  Studies/Results: No results found.  Medications: Scheduled Meds:   . amLODipine  5 mg Oral BID  . bisacodyl  10 mg Rectal Once  .  cefTRIAXone (ROCEPHIN)  IV  1 g Intravenous Q24H  . diazepam  5 mg Oral BID  . docusate sodium  100 mg Oral BID  . enoxaparin (LOVENOX) injection  40 mg Subcutaneous Q24H  . insulin aspart  0-15 Units Subcutaneous TID WC  . insulin glargine  20 Units Subcutaneous BID  . lacosamide  150 mg Oral BID  . levETIRAcetam  1,000 mg Oral Q12H  . nebivolol  20 mg Oral Daily  . pantoprazole  20 mg Oral Q1200  . phenytoin  200 mg Oral Daily  . rosuvastatin  10 mg Oral QHS  . sertraline  100 mg Oral Daily  . sodium phosphate  1 enema Rectal Once  . DISCONTD: insulin aspart  2-10 Units Subcutaneous TID AC   Continuous Infusions:   . sodium chloride 100 mL/hr at 06/13/11 1114   PRN Meds:.acetaminophen, acetaminophen, alum & mag hydroxide-simeth, ondansetron (ZOFRAN) IV, ondansetron, promethazine, zolpidem   Assessment  71 y/o female with hx of HTN, CAD, DM, AFIB, GERD, hx of ICH on dilantin and keppra presented with unsteady gait.   Plan:  Unsteady gait with dilantin toxicity  patient noted for supratherapeutic dilantin level  Dilantin was  held and her symptoms now improving  Tolerating PT well, but was nauseous today and constipated. Will order soap suds enema Level therapeuticand as per pharmacy recs dose resumed with 200 mg daily  Cont keppra   UTI  started on ceftriaxone on admission   anxiety  cont valium prn   HTN  Cont home BP meds   DM: cont home lantus and SSI   CAD  cotn bystolic, statin   AKI  mild , resolved  Anemia and chronic mild thrombocytopenia  Stable   Full code     LOS: 2 days   Kaine Mcquillen 06/13/2011, 4:49 PM

## 2011-06-13 NOTE — Progress Notes (Signed)
Subjective: Patient reports that her gait has improved.  Dilantin level is now therapeutic.  She is tearful this morning though because she is constipated.    Objective: Current vital signs: BP 143/66  Pulse 80  Temp(Src) 98.5 F (36.9 C) (Oral)  Resp 20  Ht 5\' 3"  (1.6 m)  Wt 83 kg (182 lb 15.7 oz)  BMI 32.41 kg/m2  SpO2 96% Vital signs in last 24 hours: Temp:  [98.2 F (36.8 C)-98.5 F (36.9 C)] 98.5 F (36.9 C) (01/07 1347) Pulse Rate:  [65-80] 80  (01/07 1347) Resp:  [18-20] 20  (01/07 1347) BP: (139-176)/(64-89) 143/66 mmHg (01/07 1347) SpO2:  [94 %-99 %] 96 % (01/07 1347) Weight:  [83 kg (182 lb 15.7 oz)] 182 lb 15.7 oz (83 kg) (01/07 0520)  Intake/Output from previous day: 01/06 0701 - 01/07 0700 In: 2908.3 [P.O.:360; I.V.:1048.3; IV Piggyback:1500] Out: 550 [Urine:550] Intake/Output this shift: Total I/O In: 358 [P.O.:358] Out: -  Nutritional status: Carb Control  Neurologic Exam: Mental Status: Alert, oriented.  Crying.  Speech fluent without evidence of aphasia.  Able to follow 3 step commands without difficulty. Cranial Nerves: II: visual fields grossly normal, pupils equal, round, reactive to light and accommodation III,IV, VI: ptosis not present, extra-ocular motions intact bilaterally V,VII: smile symmetric, facial light touch sensation normal bilaterally VIII: hearing normal bilaterally IX,X: gag reflex present XI: trapezius strength/neck flexion strength normal bilaterally XII: tongue strength normal  Motor: Right : Upper extremity   5/5    Left:     Upper extremity   5/5  Lower extremity   5/5     Lower extremity   5/5 Tone and bulk:normal tone throughout; no atrophy noted Sensory: Pinprick and light touch intact throughout, bilaterally Deep Tendon Reflexes: 2+ and symmetric throughout Plantars: Right: mute  Left: mute Cerebellar: normal finger-to-nose and normal heel-to-shin test     Lab Results: Results for orders placed during the  hospital encounter of 06/11/11 (from the past 48 hour(s))  POTASSIUM     Status: Normal   Collection Time   06/11/11  5:24 PM      Component Value Range Comment   Potassium 4.1  3.5 - 5.1 (mEq/L) DELTA CHECK NOTED  GLUCOSE, CAPILLARY     Status: Normal   Collection Time   06/11/11  5:37 PM      Component Value Range Comment   Glucose-Capillary 88  70 - 99 (mg/dL)    Comment 1 Notify RN     GLUCOSE, CAPILLARY     Status: Abnormal   Collection Time   06/11/11  9:44 PM      Component Value Range Comment   Glucose-Capillary 174 (*) 70 - 99 (mg/dL)   GLUCOSE, CAPILLARY     Status: Abnormal   Collection Time   06/12/11  7:44 AM      Component Value Range Comment   Glucose-Capillary 169 (*) 70 - 99 (mg/dL)   COMPREHENSIVE METABOLIC PANEL     Status: Abnormal   Collection Time   06/12/11  8:08 AM      Component Value Range Comment   Sodium 140  135 - 145 (mEq/L)    Potassium 3.9  3.5 - 5.1 (mEq/L)    Chloride 107  96 - 112 (mEq/L)    CO2 24  19 - 32 (mEq/L)    Glucose, Bld 203 (*) 70 - 99 (mg/dL)    BUN 18  6 - 23 (mg/dL)    Creatinine, Ser 1.61 (*) 0.50 -  1.10 (mg/dL)    Calcium 8.9  8.4 - 10.5 (mg/dL)    Total Protein 5.8 (*) 6.0 - 8.3 (g/dL)    Albumin 3.0 (*) 3.5 - 5.2 (g/dL)    AST 14  0 - 37 (U/L)    ALT 12  0 - 35 (U/L)    Alkaline Phosphatase 101  39 - 117 (U/L)    Total Bilirubin 0.2 (*) 0.3 - 1.2 (mg/dL)    GFR calc non Af Amer 43 (*) >90 (mL/min)    GFR calc Af Amer 50 (*) >90 (mL/min)   CBC     Status: Abnormal   Collection Time   06/12/11  8:08 AM      Component Value Range Comment   WBC 3.7 (*) 4.0 - 10.5 (K/uL)    RBC 3.17 (*) 3.87 - 5.11 (MIL/uL)    Hemoglobin 10.1 (*) 12.0 - 15.0 (g/dL)    HCT 16.1 (*) 09.6 - 46.0 (%)    MCV 89.6  78.0 - 100.0 (fL)    MCH 31.9  26.0 - 34.0 (pg)    MCHC 35.6  30.0 - 36.0 (g/dL)    RDW 04.5  40.9 - 81.1 (%)    Platelets 111 (*) 150 - 400 (K/uL) PLATELET COUNT CONFIRMED BY SMEAR  PHENYTOIN LEVEL, TOTAL     Status: Normal   Collection  Time   06/12/11  8:08 AM      Component Value Range Comment   Phenytoin Lvl 13.6  10.0 - 20.0 (ug/mL)   TSH     Status: Normal   Collection Time   06/12/11  8:08 AM      Component Value Range Comment   TSH 3.423  0.350 - 4.500 (uIU/mL)   GLUCOSE, CAPILLARY     Status: Abnormal   Collection Time   06/12/11 12:08 PM      Component Value Range Comment   Glucose-Capillary 273 (*) 70 - 99 (mg/dL)   GLUCOSE, CAPILLARY     Status: Abnormal   Collection Time   06/12/11  4:57 PM      Component Value Range Comment   Glucose-Capillary 132 (*) 70 - 99 (mg/dL)   GLUCOSE, CAPILLARY     Status: Abnormal   Collection Time   06/12/11  9:38 PM      Component Value Range Comment   Glucose-Capillary 229 (*) 70 - 99 (mg/dL)   GLUCOSE, CAPILLARY     Status: Abnormal   Collection Time   06/13/11  4:57 AM      Component Value Range Comment   Glucose-Capillary 167 (*) 70 - 99 (mg/dL)   CBC     Status: Abnormal   Collection Time   06/13/11  6:07 AM      Component Value Range Comment   WBC 3.7 (*) 4.0 - 10.5 (K/uL)    RBC 2.98 (*) 3.87 - 5.11 (MIL/uL)    Hemoglobin 9.4 (*) 12.0 - 15.0 (g/dL)    HCT 91.4 (*) 78.2 - 46.0 (%)    MCV 89.3  78.0 - 100.0 (fL)    MCH 31.5  26.0 - 34.0 (pg)    MCHC 35.3  30.0 - 36.0 (g/dL)    RDW 95.6  21.3 - 08.6 (%)    Platelets 104 (*) 150 - 400 (K/uL) CONSISTENT WITH PREVIOUS RESULT  BASIC METABOLIC PANEL     Status: Abnormal   Collection Time   06/13/11  6:07 AM      Component Value Range Comment  Sodium 142  135 - 145 (mEq/L)    Potassium 3.6  3.5 - 5.1 (mEq/L)    Chloride 110  96 - 112 (mEq/L)    CO2 26  19 - 32 (mEq/L)    Glucose, Bld 156 (*) 70 - 99 (mg/dL)    BUN 16  6 - 23 (mg/dL)    Creatinine, Ser 9.56  0.50 - 1.10 (mg/dL)    Calcium 8.5  8.4 - 10.5 (mg/dL)    GFR calc non Af Amer 51 (*) >90 (mL/min)    GFR calc Af Amer 59 (*) >90 (mL/min)   PHENYTOIN LEVEL, TOTAL     Status: Normal   Collection Time   06/13/11  6:07 AM      Component Value Range Comment    Phenytoin Lvl 11.9  10.0 - 20.0 (ug/mL)   GLUCOSE, CAPILLARY     Status: Abnormal   Collection Time   06/13/11  7:39 AM      Component Value Range Comment   Glucose-Capillary 147 (*) 70 - 99 (mg/dL)   GLUCOSE, CAPILLARY     Status: Abnormal   Collection Time   06/13/11 12:02 PM      Component Value Range Comment   Glucose-Capillary 188 (*) 70 - 99 (mg/dL)     Studies/Results: No results found.  Medications:  I have reviewed the patient's current medications. Scheduled:   . amLODipine  5 mg Oral BID  . bisacodyl  10 mg Rectal Once  . cefTRIAXone (ROCEPHIN)  IV  1 g Intravenous Q24H  . diazepam  5 mg Oral BID  . docusate sodium  100 mg Oral BID  . enoxaparin (LOVENOX) injection  40 mg Subcutaneous Q24H  . insulin aspart  0-15 Units Subcutaneous TID WC  . insulin glargine  20 Units Subcutaneous BID  . lacosamide  150 mg Oral BID  . levETIRAcetam  1,000 mg Oral Q12H  . nebivolol  20 mg Oral Daily  . pantoprazole  20 mg Oral Q1200  . phenytoin  200 mg Oral Daily  . rosuvastatin  10 mg Oral QHS  . sertraline  100 mg Oral Daily  . sodium phosphate  1 enema Rectal Once  . DISCONTD: insulin aspart  2-10 Units Subcutaneous TID AC    Assessment/Plan: Patient Active Hospital Problem List: Gait Ataxia    Assessment: Gait ataxia improved as Dilantin level has improved.  Level now therapeutic.  No further neurologic intervention is recommended at this time.  If further questions arise, please call or page at that time.     Plan:  1. Pharmacy to continue with guidance on dosing Dilantin.      LOS: 2 days   Thana Farr, MD [facility] [phone] 06/13/2011  4:24 PM

## 2011-06-14 LAB — GLUCOSE, CAPILLARY
Glucose-Capillary: 182 mg/dL — ABNORMAL HIGH (ref 70–99)
Glucose-Capillary: 154 mg/dL — ABNORMAL HIGH (ref 70–99)

## 2011-06-14 MED ORDER — DSS 100 MG PO CAPS: 100.0000 mg | ORAL_CAPSULE | Freq: Two times a day (BID) | ORAL | Status: AC

## 2011-06-14 MED ORDER — PHENYTOIN SODIUM EXTENDED 200 MG PO CAPS: 200.0000 mg | ORAL_CAPSULE | Freq: Every day | ORAL | Status: DC

## 2011-06-14 MED ORDER — CIPROFLOXACIN HCL 500 MG PO TABS: 500.0000 mg | ORAL_TABLET | Freq: Two times a day (BID) | ORAL | Status: DC

## 2011-06-14 MED ORDER — ALPRAZOLAM 0.5 MG PO TABS
0.5000 mg | ORAL_TABLET | Freq: Two times a day (BID) | ORAL | Status: DC | PRN
  Administered 2011-06-14: 0.5 mg via ORAL
  Filled 2011-06-14: qty 1

## 2011-06-14 MED ORDER — CIPROFLOXACIN HCL 500 MG PO TABS: 500.0000 mg | ORAL_TABLET | Freq: Two times a day (BID) | ORAL | Status: AC

## 2011-06-14 NOTE — Progress Notes (Signed)
Pt discharged to home per MD order.  IV removed, site unremarkable. Pressure and dressing applied.  Discharge instructions, follow-up appointments, and prescriptions given.  All belongings sent with pt and all questions answered.  Pt transported in wheelchair by tech and traveled home with family by private vehicle.

## 2011-06-14 NOTE — Discharge Summary (Signed)
Patient seen and examined.  discharge plan outlined above discussed and agree with the plan as outlined above.

## 2011-06-14 NOTE — Progress Notes (Signed)
Soap suds enema administered. Pt tolerated well.

## 2011-06-14 NOTE — Discharge Summary (Signed)
Patient ID: Alexandra [name] MRN: [id number] DOB/AGE: Aug 30, 1940 71 y.o.  Admit date: 06/11/2011 Discharge date: 06/14/2011  Primary Care Physician:  Delorse Lek, MD, MD  Discharge Diagnoses:   Present on Admission:  1.  Dilantin Toxicity 2.  Urinary Tract Infection 3.  Gait Ataxia secondary to Dilantin Toxicity. 4.  Constipation 5.  Pancytopenia - to be followed as an outpatient.  History of: 1.  Subdural  Hematoma 2.  Atrial Fibrillation 3.  Coronary Artery Disease. 4.  Hypertension 5.  Diabetes   Current Discharge Medication List    START taking these medications   Details  ciprofloxacin (CIPRO) 500 MG tablet Take 1 tablet (500 mg total) by mouth 2 (two) times daily. Qty: 4 tablet, Refills: 0    docusate sodium 100 MG CAPS Take 100 mg by mouth 2 (two) times daily. Qty: 10 capsule      CONTINUE these medications which have CHANGED   Details  phenytoin (DILANTIN) 200 MG ER capsule Take 1 capsule (200 mg total) by mouth daily. Qty: 30 capsule, Refills: 1      CONTINUE these medications which have NOT CHANGED   Details  amLODipine (NORVASC) 5 MG tablet Take 5 mg by mouth daily.      diazepam (VALIUM) 5 MG tablet Take 5 mg by mouth 2 (two) times daily.     insulin aspart (NOVOLOG) 100 UNIT/ML injection Inject 2-10 Units into the skin 3 (three) times daily before meals. Sliding scale    insulin glargine (LANTUS) 100 UNIT/ML injection Inject 20 Units into the skin 2 (two) times daily.     Lacosamide (VIMPAT) 150 MG TABS Take 150 mg by mouth 2 (two) times daily.     lansoprazole (PREVACID) 30 MG capsule Take 30 mg by mouth daily.      levETIRAcetam (KEPPRA) 500 MG tablet Take 1,000 mg by mouth every 12 (twelve) hours.      nebivolol 20 MG TABS Take 1 tablet (20 mg total) by mouth daily. Qty: 90 tablet, Refills: 3    omeprazole (PRILOSEC) 40 MG capsule Take 40 mg by mouth 2 (two) times daily.      ondansetron (ZOFRAN) 8 MG tablet Take 8 mg by mouth every 8  (eight) hours as needed. nausea     promethazine (PHENERGAN) 25 MG tablet Take 25 mg by mouth every 6 (six) hours as needed.      sertraline (ZOLOFT) 100 MG tablet Take 100 mg by mouth daily.      simvastatin (ZOCOR) 40 MG tablet Take 40 mg by mouth at bedtime.        STOP taking these medications     amoxicillin (AMOXIL) 500 MG tablet      phenytoin (DILANTIN) 50 MG tablet         Consults:  Neurology, Dr. Thana Farr.  Brief H and P: From the admission note:  the pt is a very independent female who normally lives by herself and functions well. Today she became dizzy, weak and unsteady on her feet and came to the hospital to be evaluated. The pt does have a history of intracranial bleed and therefore neurology was called and it was found that her dilantin level was slightly elevated therefore they will adjust that and will also treat her symptoms. Also it turns out that the pt has a UTI and she will be treated for that as well.  The patient was started on IV Rocephin for her urinary tract infection. Unfortunately cultures were not done. She  appears to be asymptomatic. She received 3 days of IV antibiotics inpatient, and will receive 2 more days of by mouth Cipro as an outpatient. A short duration of antibiotic therapy was chosen as Cipro can compete with Dilantin for receptors and we did not want to cause any adverse effects.   Alexandra [name] dilantin was held and her level gradually came down. CT scan of her head was without acute abnormality.  She was seen by Dr. Thana Farr who  felt that her ataxia was secondary to her elevated Dilantin level and that her Dilantin level may have been affected by her other medications.  In pharmacy managed the reinstatement of Dilantin and provided recommended dosage. The new dosage is 200 mg once daily. This is much more simple than her previous dose of 3 pills 3 times a day.  Today AlexandraRuiz is stable and ready for discharge. She has had  constipation this was remedied with enemas.  Her previous complaints of nausea have resolved with the passing of her bowel movement.  Physical therapy recommends home health PT.   Physical Exam on Discharge: General: Alert, awake, oriented x3, in no acute distress. HEENT: No bruits, no goiter. Heart: Regular rate and rhythm, without murmurs, rubs, gallops. Lungs: Clear to auscultation bilaterally. Abdomen: Soft, nontender, nondistended, positive bowel sounds. Extremities: No clubbing cyanosis or edema with positive pedal pulses. Neuro: Grossly intact, nonfocal.  Filed Vitals:   06/13/11 1347 06/13/11 2114 06/14/11 0527 06/14/11 1315  BP: 143/66 130/64 139/70 123/70  Pulse: 80 75 75 98  Temp: 98.5 F (36.9 C) 98.3 F (36.8 C) 97.8 F (36.6 C) 98.3 F (36.8 C)  TempSrc: Oral Oral Oral Oral  Resp: 20 20 18 20   Height:      Weight:   83.1 kg (183 lb 3.2 oz)   SpO2: 96% 94% 97% 98%     Intake/Output Summary (Last 24 hours) at 06/14/11 1455 Last data filed at 06/14/11 0900  Gross per 24 hour  Intake 1835.33 ml  Output      0 ml  Net 1835.33 ml    Basic Metabolic Panel:  Lab 06/13/11 1191 06/12/11 0808  NA 142 140  K 3.6 3.9  CL 110 107  CO2 26 24  GLUCOSE 156* 203*  BUN 16 18  CREATININE 1.08 1.24*  CALCIUM 8.5 8.9  MG -- --  PHOS -- --   Liver Function Tests:  Lab 06/12/11 0808 06/11/11 1413  AST 14 53*  ALT 12 17  ALKPHOS 101 94  BILITOT 0.2* 0.4  PROT 5.8* 6.8  ALBUMIN 3.0* 3.5   CBC:  Lab 06/13/11 0607 06/12/11 0808 06/11/11 1413  WBC 3.7* 3.7* --  NEUTROABS -- -- 4.2  HGB 9.4* 10.1* --  HCT 26.6* 28.4* --  MCV 89.3 89.6 --  PLT 104* 111* --   CBG:  Lab 06/14/11 1129 06/14/11 0804 06/13/11 2151 06/13/11 1709 06/13/11 1202 06/13/11 0739  GLUCAP 154* 182* 130* 218* 188* 147*   Thyroid Function Tests:  Lab 06/12/11 0808  TSH 3.423  T4TOTAL --  FREET4 --  T3FREE --  THYROIDAB --   Urine Drug Screen: Drugs of Abuse     Component Value  Date/Time   LABOPIA NONE DETECTED 03/21/2011 1725   COCAINSCRNUR NONE DETECTED 03/21/2011 1725   LABBENZ POSITIVE* 03/21/2011 1725   AMPHETMU NONE DETECTED 03/21/2011 1725   THCU NONE DETECTED 03/21/2011 1725   LABBARB NONE DETECTED 03/21/2011 1725    Alcohol Level: No results found for  this basename: ETH:2 in the last 168 hours Urinalysis: Results for KATHYLEEN, RADICE (MRN [id number]) as of 06/14/2011 14:29  Ref. Range 06/11/2011 15:13  Color, Urine Latest Range: YELLOW  ORANGE (A)  APPearance Latest Range: CLEAR  CLEAR  Specific Gravity, Urine Latest Range: 1.005-1.030  1.017  pH Latest Range: 5.0-8.0  6.5  Glucose, UA Latest Range: NEGATIVE mg/dL NEGATIVE  Bilirubin Urine Latest Range: NEGATIVE  NEGATIVE  Ketones, ur Latest Range: NEGATIVE mg/dL NEGATIVE  Protein Latest Range: NEGATIVE mg/dL NEGATIVE  Urobilinogen, UA Latest Range: 0.0-1.0 mg/dL 1.0  Nitrite Latest Range: NEGATIVE  POSITIVE (A)  Leukocytes, UA Latest Range: NEGATIVE  LARGE (A)  WBC, UA Latest Range: <3 WBC/hpf 11-20  Squamous Epithelial / LPF Latest Range: RARE  FEW (A)     Other Pertinent Lab Results:  Results for NEERA, TENG (MRN [id number]) as of 06/14/2011 14:29  Ref. Range 06/11/2011 14:13 06/12/2011 08:08 06/13/2011 06:07  Phenytoin Lvl Latest Range: 10.0-20.0 ug/mL 24.7 (H) 13.6 11.9    Significant Diagnostic Studies:  Ct Head Wo Contrast 06/11/2011  *RADIOLOGY REPORT*   IMPRESSION: No acute intracranial findings or mass lesions.     Disposition and Follow-up: [facility] will call the patient with an appointment for on-going management of  neuro medications and history of subdural hematoma.  The patient will be seen by Dr. Marjory Lies on 1/19 for hospital follow up.  Would ask him to follow pancytopenia, UTI, and ensure patient has neuro follow up scheduled.  Time spent on Discharge: 40  Signed: Stephani Police 06/14/2011, 2:55 PM [phone]

## 2011-06-14 NOTE — Progress Notes (Signed)
   CARE MANAGEMENT NOTE 06/14/2011  Patient:  Alexandra [name], Alexandra [name]   Account Number:  [id number]  Date Initiated:  06/14/2011  Documentation initiated by:  Letha Cape  Subjective/Objective Assessment:   dx  admit- lives with brother. Patient has a rolling walker and a shower chair at home.     Action/Plan:   pt eval- rec hhpt   Anticipated DC Date:  06/14/2011   Anticipated DC Plan:  HOME W HOME HEALTH SERVICES      DC Planning Services  CM consult      [employer] Choice  HOME HEALTH   Choice offered to / List presented to:  C-1 Patient        HH arranged  HH-2 PT      [facility] agency  [facility]   Status of service:  Completed, signed off Medicare Important Message given?   (If response is "NO", the following Medicare IM given date fields will be blank) Date Medicare IM given:   Date Additional Medicare IM given:    Discharge Disposition:  HOME W HOME HEALTH SERVICES  Per UR Regulation:    Comments:  PCP Dr. Rosezetta Schlatter at [facility] in [facility]  06/14/11 15:47 Letha Cape RN, BSN [phone] Patient for dc today, patient chose [facility] for hhpt from agency list.  Referral sent to [facility], Debbie notified.  Soc will begin 24-48 hrs post discharge.

## 2011-06-14 NOTE — Progress Notes (Signed)
Physical Therapy Treatment Patient Details Name: Alexandra [name] MRN: [id number] DOB: [birth year] Today's Date: 06/14/2011  PT Assessment/Plan  PT - Assessment/Plan Comments on Treatment Session: Pt very anxious today and tearful throughout. Pt states she feels like "something else will go wrong" re prior brain surgery. Pt also nervous about not seeing the doctor this morning.  PT Plan: Discharge plan remains appropriate PT Frequency: Min 3X/week Follow Up Recommendations: Home health PT Equipment Recommended: None recommended by PT PT Goals  Acute Rehab PT Goals PT Goal: Supine/Side to Sit - Progress: Met PT Goal: Sit to Supine/Side - Progress: Met PT Goal: Sit to Stand - Progress: Progressing toward goal PT Goal: Stand to Sit - Progress: Progressing toward goal PT Transfer Goal: Bed to Chair/Chair to Bed - Progress: Progressing toward goal PT Goal: Ambulate - Progress: Progressing toward goal  PT Treatment Precautions/Restrictions  Restrictions Weight Bearing Restrictions: No Mobility (including Balance) Bed Mobility Bed Mobility: Yes Supine to Sit: 6: Modified independent (Device/Increase time) Sit to Supine: 6: Modified independent (Device/Increase time) Transfers Sit to Stand: 5: Supervision;From bed;From chair/3-in-1 Sit to Stand Details (indicate cue type and reason): Cues for safe positioning before sitting and safe hand placement Stand to Sit: 5: Supervision;To bed;To chair/3-in-1 Ambulation/Gait Ambulation/Gait Assistance: 5: Supervision Ambulation/Gait Assistance Details (indicate cue type and reason): Cues for safe positioning inside of RW Ambulation Distance (Feet): 200 Feet Assistive device: Rolling walker Gait Pattern: Within Functional Limits Stairs:  (Pt declined stairs today)    Exercise    End of Session PT - End of Session Activity Tolerance: Patient tolerated treatment well Patient left: in bed;with call bell in reach Nurse Communication: Mobility  status for transfers;Mobility status for ambulation General Behavior During Session: [facility] for tasks performed Cognition: [facility] for tasks performed  Alexandra [name], Adline Potter 06/14/2011, 12:07 PM 06/14/2011 Fredrich Birks PTA [phone] pager [phone] office

## 2011-07-13 NOTE — Telephone Encounter (Signed)
Enc closed

## 2011-08-02 ENCOUNTER — Encounter: Payer: Self-pay | Admitting: Cardiovascular Disease

## 2011-08-02 ENCOUNTER — Ambulatory Visit (INDEPENDENT_AMBULATORY_CARE_PROVIDER_SITE_OTHER): Payer: PRIVATE HEALTH INSURANCE | Admitting: Cardiovascular Disease

## 2011-08-02 VITALS — BP 146/71 | HR 56 | Ht 63.0 in | Wt 175.0 lb

## 2011-08-02 DIAGNOSIS — E785 Hyperlipidemia, unspecified: Secondary | ICD-10-CM | POA: Insufficient documentation

## 2011-08-02 DIAGNOSIS — I4891 Unspecified atrial fibrillation: Secondary | ICD-10-CM

## 2011-08-02 LAB — LIPID PANEL
HDL: 41.9 mg/dL (ref >39.00)
LDL Cholesterol: 69 mg/dL (ref 0–99)
Total CHOL/HDL Ratio: 3
Triglycerides: 150 mg/dL — ABNORMAL HIGH (ref 0.0–149.0)
VLDL: 30 mg/dL (ref 0.0–40.0)

## 2011-08-02 LAB — BASIC METABOLIC PANEL
Calcium: 9.3 mg/dL (ref 8.4–10.5)
Chloride: 107 mEq/L (ref 96–112)
Creatinine, Ser: 1.3 mg/dL — ABNORMAL HIGH (ref 0.4–1.2)
GFR: 42.2 mL/min — ABNORMAL LOW (ref >60.00)

## 2011-08-02 LAB — HEPATIC FUNCTION PANEL
Bilirubin, Direct: 0.1 mg/dL (ref 0.0–0.3)
Total Bilirubin: 0.4 mg/dL (ref 0.3–1.2)

## 2011-08-02 MED ORDER — ATORVASTATIN CALCIUM 20 MG PO TABS: 20.0000 mg | ORAL_TABLET | Freq: Every day | ORAL | Status: DC

## 2011-08-02 NOTE — Progress Notes (Signed)
Alexandra [name] Date of Birth  Jan 04, 1941 [facility]     [place] Office  [address]    [address]   [address] [place], [place]  [address]    [place], [place]  [address] [phone]  Fax  [phone]  [phone]  Fax [phone]  Problem list: 1. Atrial fibrillation-not a Coumadin candidate because of a series of falls and subdural hematoma 2. Diabetes mellitus 3. Hyperlipidemia 4. History of breast cancer  History of Present Illness:  [name] was hospitalized this winter after falling and having a subdural hematoma. She is seen today in followup.  Clinically she still is in sinus rhythm.  Current Outpatient Prescriptions on File Prior to Visit  Medication Sig Dispense Refill  . amLODipine (NORVASC) 5 MG tablet Take 10 mg by mouth daily.       . diazepam (VALIUM) 5 MG tablet Take 5 mg by mouth 2 (two) times daily.       . insulin aspart (NOVOLOG) 100 UNIT/ML injection Inject 2-10 Units into the skin 3 (three) times daily before meals. Sliding scale      . insulin glargine (LANTUS) 100 UNIT/ML injection Inject 20 Units into the skin 2 (two) times daily.       . Lacosamide (VIMPAT) 150 MG TABS Take 150 mg by mouth 2 (two) times daily.       . lansoprazole (PREVACID) 30 MG capsule Take 30 mg by mouth daily.        Marland Kitchen levETIRAcetam (KEPPRA) 500 MG tablet Take 1,000 mg by mouth every 12 (twelve) hours.        Marland Kitchen omeprazole (PRILOSEC) 40 MG capsule Take 40 mg by mouth 2 (two) times daily.        . phenytoin (DILANTIN) 200 MG ER capsule Take 1 capsule (200 mg total) by mouth daily.  30 capsule  1  . promethazine (PHENERGAN) 25 MG tablet Take 25 mg by mouth every 6 (six) hours as needed.        . sertraline (ZOLOFT) 100 MG tablet Take 100 mg by mouth daily.        . simvastatin (ZOCOR) 40 MG tablet Take 40 mg by mouth at bedtime.          Allergies  Allergen Reactions  . Sulfa Antibiotics     Past Medical History  Diagnosis Date  . HTN (hypertension)    . CAD (coronary artery disease)     Remote CABG in 2008  . DM (diabetes mellitus)   . HTN (hypertension)   . Atrial fibrillation   . GERD (gastroesophageal reflux disease)   . Breast cancer   . Venous insufficiency   . Left bundle branch block   . Obesity   . Chronic anticoagulation     Past Surgical History  Procedure Date  . Partial hysterectomy     1985  . Total abdominal hysterectomy 1986  . Coronary artery bypass graft 2008    x3 2008.  [place] (Dr. Edrick [facility] [phone]  . Cholecystectomy     History  Smoking status  . Never Smoker   Smokeless tobacco  . Never Used    History  Alcohol Use No    Family History  Problem Relation Age of Onset  . Emphysema Father   . Hypertension Father   . Hypertension Mother   . Stroke Mother   . Diabetes Mother   . Diabetes Brother     Reviw of Systems:  Reviewed in the HPI.  All other systems are negative.  Physical Exam: Blood pressure 146/71, pulse 56, height 5\' 3"  (1.6 m), weight 175 lb (79.379 kg). General: Well developed, well nourished, in no acute distress.  Head: Normocephalic, atraumatic, sclera non-icteric, mucus membranes are moist,   Neck: Supple. Carotids are 2 + without bruits. No JVD  Lungs: Clear bilaterally to auscultation.  Heart: regular rate  With normal  S1 S2. No murmurs, gallops or rubs.  Abdomen: Soft, non-tender, non-distended with normal bowel sounds. No hepatomegaly. No rebound/guarding. No masses.  Msk:  Strength and tone are normal  Extremities: No clubbing or cyanosis. No edema.  Distal pedal pulses are 2+ and equal bilaterally.  Neuro: Alert and oriented X 3. Moves all extremities spontaneously.  Psych:  Responds to questions appropriately with a normal affect.  ECG:  Assessment / Plan:

## 2011-08-02 NOTE — Patient Instructions (Signed)
Your physician wants you to follow-up in: 6 months  You will receive a reminder letter in the mail two months in advance. If you don't receive a letter, please call our office to schedule the follow-up appointment.   Your physician recommends that you return for a FASTING lipid profile: today and in 3 months    Your physician has recommended you make the following change in your medication:   Stop simvastatin Start atorvastatin 20 mg daily

## 2011-08-02 NOTE — Assessment & Plan Note (Signed)
She's currently on amlodipine as well simvastatin 40 mg a day. This is too high a dose of her simvastatin. He said lowering her to 20 mg which I think is generally not effective, we will change her to atorvastatin 20 mg a day. We'll check her fasting labs again in 3 months and I'll see her again in 6 months for office visit and fasting laboratory.

## 2011-08-02 NOTE — Assessment & Plan Note (Addendum)
Alexandra [name] remains in sinus rhythm. She has a history of intermittent atrial fibrillation. She's not a Coumadin candidate because of her history of falling and her recent hospitalization for subdural hematoma.  We'll continue with her same medications.

## 2011-08-03 ENCOUNTER — Telehealth: Payer: Self-pay | Admitting: Cardiovascular Disease

## 2011-08-03 MED ORDER — ATORVASTATIN CALCIUM 20 MG PO TABS: 20.0000 mg | ORAL_TABLET | Freq: Every day | ORAL | Status: DC

## 2011-08-03 NOTE — Telephone Encounter (Signed)
New msg Pt wants to talk to about generic med for lipitor.She wants to know if she could get for 90 day supply

## 2011-08-03 NOTE — Telephone Encounter (Signed)
90 day supply ordered

## 2011-11-02 ENCOUNTER — Other Ambulatory Visit (INDEPENDENT_AMBULATORY_CARE_PROVIDER_SITE_OTHER): Payer: PRIVATE HEALTH INSURANCE

## 2011-11-02 DIAGNOSIS — E785 Hyperlipidemia, unspecified: Secondary | ICD-10-CM

## 2011-11-02 LAB — LIPID PANEL
Cholesterol: 127 mg/dL (ref 0–200)
HDL: 49.5 mg/dL (ref >39.00)
VLDL: 14.4 mg/dL (ref 0.0–40.0)

## 2011-11-02 LAB — BASIC METABOLIC PANEL
BUN: 28 mg/dL — ABNORMAL HIGH (ref 6–23)
CO2: 28 mEq/L (ref 19–32)
Calcium: 9.3 mg/dL (ref 8.4–10.5)
GFR: 40.06 mL/min — ABNORMAL LOW (ref >60.00)
Glucose, Bld: 101 mg/dL — ABNORMAL HIGH (ref 70–99)

## 2011-11-02 LAB — HEPATIC FUNCTION PANEL: Albumin: 3.7 g/dL (ref 3.5–5.2)

## 2011-11-07 ENCOUNTER — Ambulatory Visit
Admission: RE | Admit: 2011-11-07 | Discharge: 2011-11-07 | Disposition: A | Discharge status: Home / Self Care | Payer: PRIVATE HEALTH INSURANCE | Source: Ambulatory Visit | Attending: Neurology | Admitting: Neurology

## 2011-11-07 ENCOUNTER — Other Ambulatory Visit: Payer: Self-pay | Admitting: Neurology

## 2011-11-07 DIAGNOSIS — M47812 Spondylosis without myelopathy or radiculopathy, cervical region: Secondary | ICD-10-CM

## 2011-11-07 DIAGNOSIS — G40309 Generalized idiopathic epilepsy and epileptic syndromes, not intractable, without status epilepticus: Secondary | ICD-10-CM

## 2012-01-24 ENCOUNTER — Ambulatory Visit: Payer: PRIVATE HEALTH INSURANCE | Admitting: Cardiovascular Disease

## 2012-01-26 ENCOUNTER — Encounter: Payer: Self-pay | Admitting: Cardiovascular Disease

## 2012-01-26 ENCOUNTER — Ambulatory Visit (INDEPENDENT_AMBULATORY_CARE_PROVIDER_SITE_OTHER): Payer: PRIVATE HEALTH INSURANCE | Admitting: Cardiovascular Disease

## 2012-01-26 VITALS — BP 143/73 | HR 56 | Ht 63.0 in | Wt 181.8 lb

## 2012-01-26 DIAGNOSIS — I4891 Unspecified atrial fibrillation: Secondary | ICD-10-CM

## 2012-01-26 DIAGNOSIS — I2510 Atherosclerotic heart disease of native coronary artery without angina pectoris: Secondary | ICD-10-CM

## 2012-01-26 NOTE — Progress Notes (Signed)
Alexandra [name] Date of Birth  08-30-1940 [facility]     [place] Office  [address]    [address]   [address] [place], [place]  [address]    [place], [place]  [address] [phone]  Fax  [phone]  [phone]  Fax [phone]  Problem list: 1. Atrial fibrillation-not a Coumadin candidate because of a series of falls and subdural hematoma 2. Diabetes mellitus 3. Hyperlipidemia 4. History of breast cancer  History of Present Illness:  Alexandra [name] was hospitalized this winter after falling and having a subdural hematoma. She is seen today in followup.  Clinically she still is in sinus rhythm.  She's not had any episodes of dizziness or headaches.  Her balance is now good.  Has not had any falling recently.  Current Outpatient Prescriptions on File Prior to Visit  Medication Sig Dispense Refill  . amLODipine (NORVASC) 5 MG tablet Take 10 mg by mouth daily.       Marland Kitchen aspirin 81 MG tablet Take 81 mg by mouth daily.      Marland Kitchen atorvastatin (LIPITOR) 20 MG tablet Take 1 tablet (20 mg total) by mouth daily.  90 tablet  1  . insulin aspart (NOVOLOG) 100 UNIT/ML injection Inject 2-10 Units into the skin 3 (three) times daily before meals. Sliding scale      . insulin glargine (LANTUS) 100 UNIT/ML injection Inject into the skin. Sliding Scale      . Nebivolol HCl (BYSTOLIC) 20 MG TABS Take 20 mg by mouth daily.      . pantoprazole (PROTONIX) 40 MG tablet Take 40 mg by mouth daily.      . sertraline (ZOLOFT) 100 MG tablet Take 100 mg by mouth daily.          Allergies  Allergen Reactions  . Sulfa Antibiotics     Past Medical History  Diagnosis Date  . HTN (hypertension)   . CAD (coronary artery disease)     Remote CABG in 2008  . DM (diabetes mellitus)   . HTN (hypertension)   . Atrial fibrillation   . GERD (gastroesophageal reflux disease)   . Breast cancer   . Venous insufficiency   . Left bundle branch block   . Obesity   . Chronic anticoagulation      Past Surgical History  Procedure Date  . Partial hysterectomy     1985  . Total abdominal hysterectomy 1986  . Coronary artery bypass graft 2008    x3 2008.  [place] ([facility] Ed[facility] [phone]  . Cholecystectomy     History  Smoking status  . Never Smoker   Smokeless tobacco  . Never Used    History  Alcohol Use No    Family History  Problem Relation Age of Onset  . Emphysema Father   . Hypertension Father   . Hypertension Mother   . Stroke Mother   . Diabetes Mother   . Diabetes Brother     Reviw of Systems:  Reviewed in the HPI.  All other systems are negative.  Physical Exam: Blood pressure 143/73, pulse 56, height 5\' 3"  (1.6 m), weight 181 lb 12.8 oz (82.464 kg). General: Well developed, well nourished, in no acute distress.  Head: Normocephalic, atraumatic, sclera non-icteric, mucus membranes are moist,   Neck: Supple. Carotids are 2 + without bruits. No JVD  Lungs: Clear bilaterally to auscultation.  Heart: regular rate  With normal  S1 S2. No murmurs, gallops or  rubs.  Abdomen: Soft, non-tender, non-distended with normal bowel sounds. No hepatomegaly. No rebound/guarding. No masses.  Msk:  Strength and tone are normal  Extremities: No clubbing or cyanosis. No edema.  Distal pedal pulses are 2+ and equal bilaterally.  Neuro: Alert and oriented X 3. Moves all extremities spontaneously.  Psych:  Responds to questions appropriately with a normal affect.  ECG:  Assessment / Plan:

## 2012-01-26 NOTE — Assessment & Plan Note (Signed)
Alexandra [name] is doing well. She's not had any episodes of chest pain or shortness of breath. I'll see her again in 6 months for fasting labs and an EKG.

## 2012-01-26 NOTE — Patient Instructions (Addendum)
Your physician wants you to follow-up in: 6 months  You will receive a reminder letter in the mail two months in advance. If you don't receive a letter, please call our office to schedule the follow-up appointment.  Your physician recommends that you return for a FASTING lipid profile: 6 months   

## 2012-01-26 NOTE — Assessment & Plan Note (Addendum)
Clinically she remains in normal sinus rhythm. I don't think that we need to add back Coumadin at this point. We'll continue to see her on regular basis . I'll see her again in 6 months.

## 2012-01-30 ENCOUNTER — Telehealth: Payer: Self-pay | Admitting: Cardiovascular Disease

## 2012-01-30 NOTE — Telephone Encounter (Signed)
New Problem:    Patient would like you to call her about signing gas voucher.  Please call back.

## 2012-01-30 NOTE — Telephone Encounter (Signed)
Pt will drop off tomorrow. I will fill out and fax .

## 2012-01-31 ENCOUNTER — Telehealth: Payer: Self-pay | Admitting: Cardiovascular Disease

## 2012-01-31 NOTE — Telephone Encounter (Signed)
PT OUT OF ATORAVASTIN PLS CALL [phone]

## 2012-02-01 ENCOUNTER — Other Ambulatory Visit: Payer: Self-pay | Admitting: *Deleted

## 2012-02-01 MED ORDER — ATORVASTATIN CALCIUM 20 MG PO TABS: 20.0000 mg | ORAL_TABLET | Freq: Every day | ORAL | Status: DC

## 2012-05-11 ENCOUNTER — Telehealth: Payer: Self-pay | Admitting: Cardiovascular Disease

## 2012-05-11 NOTE — Telephone Encounter (Signed)
Patient called because she said, wants to see if she can come earlier on her OV date to have labs prior the appointment with Dr. Elease Hashimoto on 07/25/12 at 11:30 AM. Pt    is a diabetic and it is hard for her to be without eating till after she sees the doctor. On her last OV Dr. Elease Hashimoto write to get Fasting lipids. Patient is taken Lipitor so, Md may wants for her to get LFT and BMET ?Marland Kitchen

## 2012-05-11 NOTE — Telephone Encounter (Signed)
New problem:    Need an order for fasting lab work to be put into the system.

## 2012-05-14 ENCOUNTER — Other Ambulatory Visit: Payer: Self-pay | Admitting: *Deleted

## 2012-05-14 DIAGNOSIS — E785 Hyperlipidemia, unspecified: Secondary | ICD-10-CM

## 2012-05-14 NOTE — Telephone Encounter (Signed)
CALLED [facility]/ TOLD HER SHE CAN COME EARLY FOR LABS.

## 2012-05-14 NOTE — Progress Notes (Signed)
Orders placed for fasting labs.

## 2012-07-23 ENCOUNTER — Telehealth: Payer: Self-pay | Admitting: Cardiovascular Disease

## 2012-07-23 NOTE — Telephone Encounter (Signed)
New problem    PCP is doing blood work on Thursday the office will send over results.

## 2012-07-23 NOTE — Telephone Encounter (Signed)
NOTED

## 2012-07-25 ENCOUNTER — Other Ambulatory Visit (INDEPENDENT_AMBULATORY_CARE_PROVIDER_SITE_OTHER): Payer: PRIVATE HEALTH INSURANCE

## 2012-07-25 ENCOUNTER — Ambulatory Visit (INDEPENDENT_AMBULATORY_CARE_PROVIDER_SITE_OTHER): Payer: PRIVATE HEALTH INSURANCE | Admitting: Cardiovascular Disease

## 2012-07-25 ENCOUNTER — Encounter: Payer: Self-pay | Admitting: Cardiovascular Disease

## 2012-07-25 VITALS — BP 150/80 | HR 59 | Ht 63.0 in | Wt 193.1 lb

## 2012-07-25 DIAGNOSIS — R0989 Other specified symptoms and signs involving the circulatory and respiratory systems: Secondary | ICD-10-CM

## 2012-07-25 DIAGNOSIS — I10 Essential (primary) hypertension: Secondary | ICD-10-CM

## 2012-07-25 DIAGNOSIS — I4891 Unspecified atrial fibrillation: Secondary | ICD-10-CM

## 2012-07-25 NOTE — Progress Notes (Signed)
[name] Date of Birth  Sep 26, 1940 [facility]     [place] Office  [address]    [address]   [address] [place], [place]  [address]    [place], [place]  [address] [phone]  Fax  [phone]  [phone]  Fax [phone]  Problem list: 1. Atrial fibrillation-not a Coumadin candidate because of a series of falls and subdural hematoma 2. Diabetes mellitus 3. Hyperlipidemia 4. History of breast cancer  History of Present Illness:  Ms. Jamie Brookes was hospitalized this winter after falling and having a subdural hematoma. She is seen today in followup.  Clinically she still is in sinus rhythm.  She's not had any episodes of dizziness or headaches.  Her balance is now good.  Has not had any falling recently.  Feb. 19, 2014: Kambry has done well since I saw last her. Her blood pressure has been a little elevated. She was started on lisinopril 5 mg a day by her medical doctor. She's scheduled have blood work for. She's had a reaction to ACE inhibitors in the past.  She seems to be tolerating it well.  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  Current Outpatient Prescriptions on File Prior to Visit  Medication Sig Dispense Refill  . amLODipine (NORVASC) 5 MG tablet Take 10 mg by mouth daily.       Marland Kitchen aspirin 81 MG tablet Take 81 mg by mouth daily.      Marland Kitchen atorvastatin (LIPITOR) 20 MG tablet Take 1 tablet (20 mg total) by mouth daily.  90 tablet  3  . insulin aspart (NOVOLOG) 100 UNIT/ML injection Inject 2-10 Units into the skin 3 (three) times daily before meals. Sliding scale      . insulin glargine (LANTUS) 100 UNIT/ML injection Inject into the skin. Sliding Scale      . Multiple Vitamins-Minerals (MULTIVITAMIN PO) Take by mouth daily.      . Nebivolol HCl (BYSTOLIC) 20 MG TABS Take 20 mg by mouth daily.      . pantoprazole (PROTONIX) 40 MG tablet Take 40 mg by mouth daily.      . sertraline (ZOLOFT) 100 MG tablet Take 100 mg by mouth daily.         No current  facility-administered medications on file prior to visit.    Allergies  Allergen Reactions  . Sulfa Antibiotics     Past Medical History  Diagnosis Date  . HTN (hypertension)   . CAD (coronary artery disease)     Remote CABG in 2008  . DM (diabetes mellitus)   . HTN (hypertension)   . Atrial fibrillation   . GERD (gastroesophageal reflux disease)   . Breast cancer   . Venous insufficiency   . Left bundle branch block   . Obesity   . Chronic anticoagulation     Past Surgical History  Procedure Laterality Date  . Partial hysterectomy      1985  . Total abdominal hysterectomy  1986  . Coronary artery bypass graft  2008    x3 2008.  [place] ([facility] Edrick [facility] [phone]  . Cholecystectomy      History  Smoking status  . Never Smoker   Smokeless tobacco  . Never Used    History  Alcohol Use No    Family History  Problem Relation Age of Onset  . Emphysema Father   . Hypertension Father   . Hypertension Mother   . Stroke Mother   .  Diabetes Mother   . Diabetes Brother     Reviw of Systems:  Reviewed in the HPI.  All other systems are negative.  Physical Exam: Blood pressure 150/80, pulse 59, height 5\' 3"  (1.6 m), weight 193 lb 1.9 oz (87.599 kg). General: Well developed, well nourished, in no acute distress.  Head: Normocephalic, atraumatic, sclera non-icteric, mucus membranes are moist,   Neck: Supple. Carotids are 2 + without bruits. No JVD  Lungs: Clear bilaterally to auscultation.  Heart: regular rate  With normal  S1 S2. No murmurs, gallops or rubs.  Abdomen: Soft, non-tender, non-distended with normal bowel sounds. No hepatomegaly. No rebound/guarding. No masses.  Msk:  Strength and tone are normal  Extremities: No clubbing or cyanosis. No edema.  Distal pedal pulses are 2+ and equal bilaterally.  Neuro: Alert and oriented X 3. Moves all extremities spontaneously.  Psych:  Responds to questions appropriately with a  normal affect.  ECG: 07/25/2012:  Sinus brady at 59  NS IVCD.   Assessment / Plan:

## 2012-07-25 NOTE — Assessment & Plan Note (Signed)
Her blood pressure is mildly elevated today. Several weeks ago she was started on lisinopril 5 mg a day by Dr. Doristine Counter.  She will be going back to his office tomorrow for blood work and we'll followup with him for her blood pressure.

## 2012-07-25 NOTE — Assessment & Plan Note (Signed)
She remains in normal sinus rhythm. She has not had any recurrent atrial fibrillation. She seems to be tolerating the Bystolic well.  We have had her on Coumadin in the past but discontinued it because she fell and had a subdural hematoma last year.  At this point she remains very stable. I'll see her again in one year. If she continues to be stable we may be able to see her on an as needed basis.

## 2012-07-25 NOTE — Patient Instructions (Addendum)
Your physician wants you to follow-up in: 1 year. You will receive a reminder letter in the mail two months in advance. If you don't receive a letter, please call our office to schedule the follow-up appointment.    Continue same medications.

## 2012-07-26 ENCOUNTER — Encounter: Payer: Self-pay | Admitting: Cardiovascular Disease

## 2012-10-05 NOTE — Telephone Encounter (Signed)
Called PCP and requested labs.

## 2012-11-15 ENCOUNTER — Other Ambulatory Visit: Payer: Self-pay | Admitting: *Deleted

## 2012-11-15 ENCOUNTER — Ambulatory Visit (HOSPITAL_COMMUNITY)
Admission: RE | Admit: 2012-11-15 | Discharge: 2012-11-15 | Disposition: A | Discharge status: Home / Self Care | Payer: PRIVATE HEALTH INSURANCE | Source: Ambulatory Visit | Attending: Cardiovascular Disease | Admitting: Cardiovascular Disease

## 2012-11-15 DIAGNOSIS — M79609 Pain in unspecified limb: Secondary | ICD-10-CM

## 2012-11-15 DIAGNOSIS — M79604 Pain in right leg: Secondary | ICD-10-CM

## 2012-11-15 DIAGNOSIS — R609 Edema, unspecified: Secondary | ICD-10-CM | POA: Insufficient documentation

## 2012-11-15 NOTE — Progress Notes (Signed)
Venous Duplex Right Lower Ext. Completed. No evidence of DVT. Marilynne Halsted, RDMS, RVT

## 2012-11-22 IMAGING — CR DG CHEST 1V PORT
1 series · 1 of 1 positions shown · non-contrast
Comparison: 03/01/2011

CLINICAL DATA: Seizures.  Confusion.  Short of breath

PORTABLE CHEST - 1 VIEW

[AP]
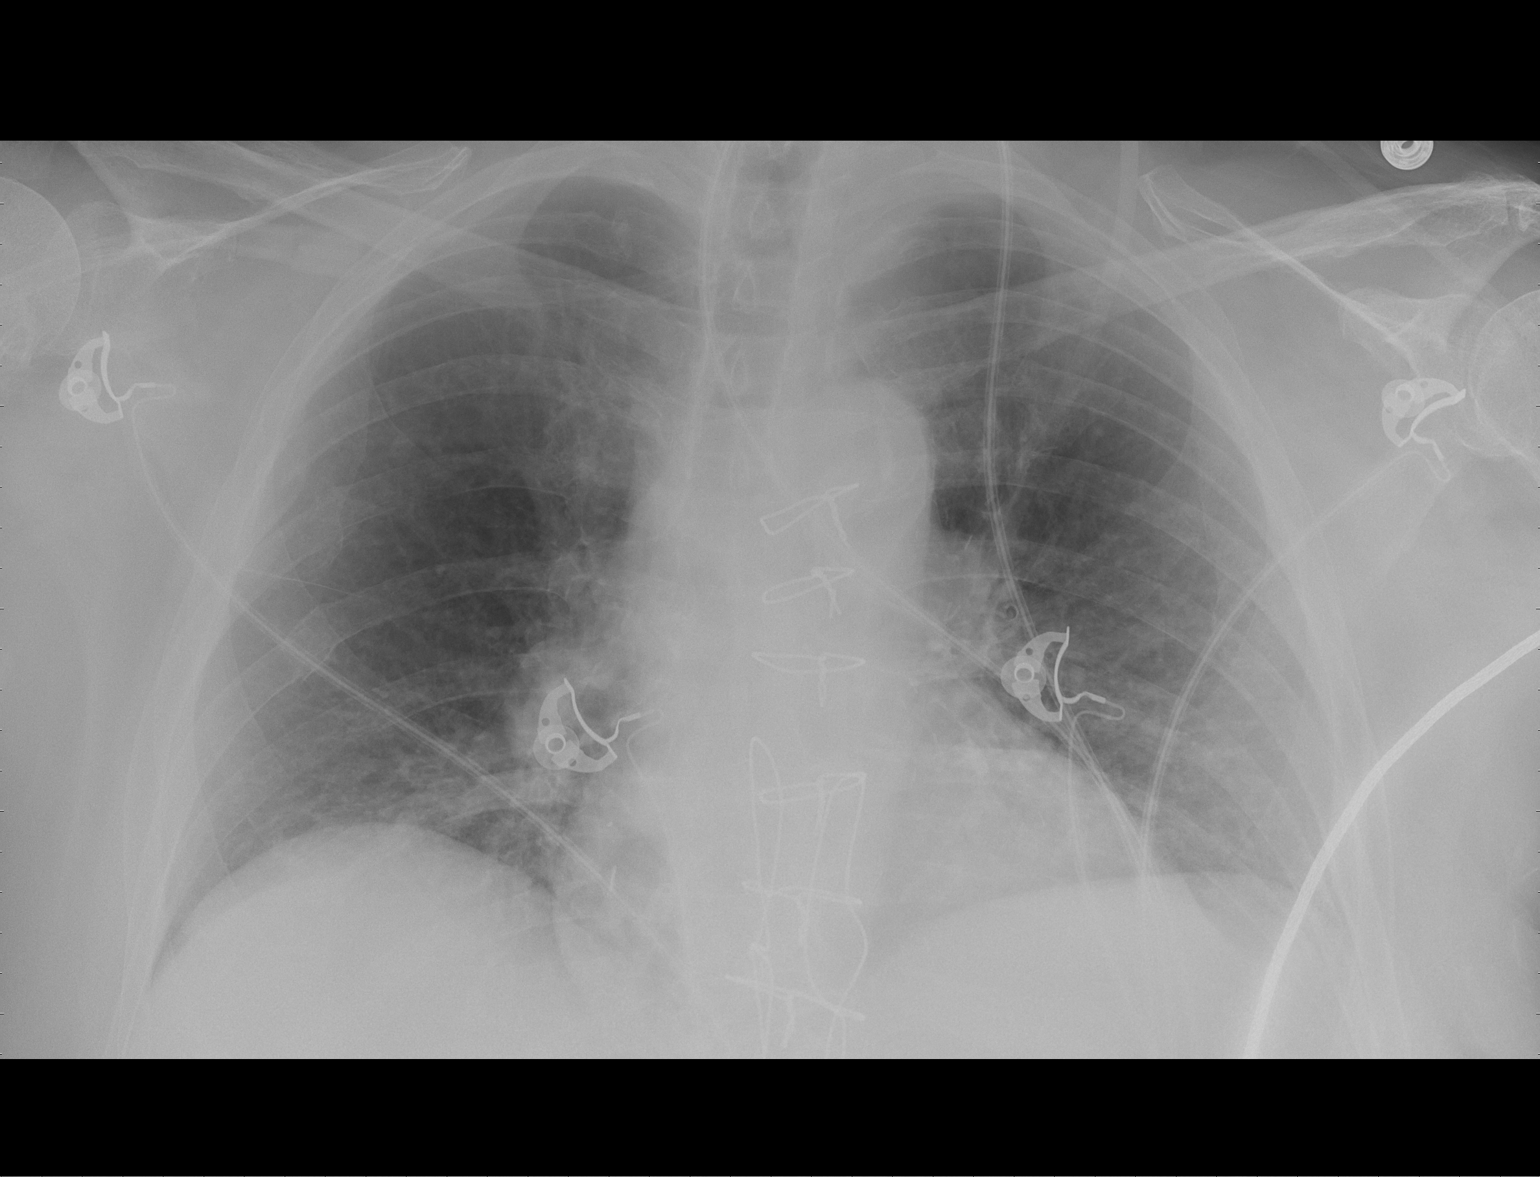

[1 of 1 positions shown; findings below may reference images not displayed]

FINDINGS: Stable postoperative changes in the mediastinum.  Shallow
inspiration.  Borderline heart size and pulmonary vascularity are
likely normal for technique.  Slight interstitial changes in the
bases may represent chronic bronchitic change. No focal airspace
consolidation.  No blunting of costophrenic angles.  No
pneumothorax.  Old right rib fractures.
IMPRESSION: No evidence of active pulmonary disease.  Shallow inspiration with
chronic bronchitic changes.

## 2013-01-01 ENCOUNTER — Other Ambulatory Visit: Payer: Self-pay | Admitting: Cardiovascular Disease

## 2013-01-01 NOTE — Telephone Encounter (Signed)
Fax Received. Refill Completed. Kirstina Leinweber Chowoe (R.M.A)   

## 2013-04-10 ENCOUNTER — Ambulatory Visit (INDEPENDENT_AMBULATORY_CARE_PROVIDER_SITE_OTHER): Payer: Medicare Other | Admitting: Podiatry

## 2013-04-10 ENCOUNTER — Encounter: Payer: Self-pay | Admitting: Podiatry

## 2013-04-10 VITALS — BP 141/63 | HR 84 | Resp 16

## 2013-04-10 DIAGNOSIS — M79609 Pain in unspecified limb: Secondary | ICD-10-CM

## 2013-04-10 DIAGNOSIS — M722 Plantar fascial fibromatosis: Secondary | ICD-10-CM

## 2013-04-10 DIAGNOSIS — B351 Tinea unguium: Secondary | ICD-10-CM

## 2013-04-10 NOTE — Progress Notes (Signed)
Subjective:     Patient ID: Alexandra [name], female   DOB: 21-Sep-1940, 72 y.o.   MRN: [id number]  HPI patient states my toenails are thick and I cannot take care of them myself and they become painful   Review of Systems     Objective:   Physical Exam  Nursing note and vitals reviewed. Constitutional: She is oriented to person, place, and time.  Cardiovascular: Intact distal pulses.   Neurological: She is oriented to person, place, and time.   nailbeds are thickened and painful 1-5 both feet     Assessment:     Mycotic nail infection with pain 1-5 both feet    Plan:     Debridement nailbeds 1-5 both feet with no iatrogenic bleeding noted

## 2013-04-19 ENCOUNTER — Ambulatory Visit: Payer: PRIVATE HEALTH INSURANCE | Admitting: Physician Assistant

## 2013-06-10 ENCOUNTER — Inpatient Hospital Stay (HOSPITAL_COMMUNITY)
Admission: EM | Admit: 2013-06-10 | Discharge: 2013-06-17 | DRG: 223 | Disposition: A | Discharge status: Home / Self Care | Payer: PRIVATE HEALTH INSURANCE | Attending: Internal Medicine | Admitting: Internal Medicine

## 2013-06-10 ENCOUNTER — Emergency Department (HOSPITAL_COMMUNITY): Payer: PRIVATE HEALTH INSURANCE

## 2013-06-10 ENCOUNTER — Encounter (HOSPITAL_COMMUNITY): Payer: Self-pay | Admitting: Radiology

## 2013-06-10 DIAGNOSIS — Z6832 Body mass index (BMI) 32.0-32.9, adult: Secondary | ICD-10-CM | POA: Diagnosis not present

## 2013-06-10 DIAGNOSIS — Z951 Presence of aortocoronary bypass graft: Secondary | ICD-10-CM

## 2013-06-10 DIAGNOSIS — I469 Cardiac arrest, cause unspecified: Secondary | ICD-10-CM

## 2013-06-10 DIAGNOSIS — I4891 Unspecified atrial fibrillation: Secondary | ICD-10-CM | POA: Diagnosis present

## 2013-06-10 DIAGNOSIS — Z8673 Personal history of transient ischemic attack (TIA), and cerebral infarction without residual deficits: Secondary | ICD-10-CM | POA: Diagnosis not present

## 2013-06-10 DIAGNOSIS — Z833 Family history of diabetes mellitus: Secondary | ICD-10-CM | POA: Diagnosis not present

## 2013-06-10 DIAGNOSIS — R4182 Altered mental status, unspecified: Secondary | ICD-10-CM | POA: Diagnosis present

## 2013-06-10 DIAGNOSIS — I872 Venous insufficiency (chronic) (peripheral): Secondary | ICD-10-CM | POA: Diagnosis present

## 2013-06-10 DIAGNOSIS — N183 Chronic kidney disease, stage 3 unspecified: Secondary | ICD-10-CM | POA: Diagnosis present

## 2013-06-10 DIAGNOSIS — E669 Obesity, unspecified: Secondary | ICD-10-CM

## 2013-06-10 DIAGNOSIS — N179 Acute kidney failure, unspecified: Secondary | ICD-10-CM | POA: Diagnosis not present

## 2013-06-10 DIAGNOSIS — I2510 Atherosclerotic heart disease of native coronary artery without angina pectoris: Secondary | ICD-10-CM

## 2013-06-10 DIAGNOSIS — IMO0002 Reserved for concepts with insufficient information to code with codable children: Secondary | ICD-10-CM

## 2013-06-10 DIAGNOSIS — I2582 Chronic total occlusion of coronary artery: Secondary | ICD-10-CM | POA: Diagnosis present

## 2013-06-10 DIAGNOSIS — N289 Disorder of kidney and ureter, unspecified: Secondary | ICD-10-CM

## 2013-06-10 DIAGNOSIS — I4892 Unspecified atrial flutter: Secondary | ICD-10-CM | POA: Diagnosis present

## 2013-06-10 DIAGNOSIS — I498 Other specified cardiac arrhythmias: Secondary | ICD-10-CM | POA: Diagnosis present

## 2013-06-10 DIAGNOSIS — S065XAA Traumatic subdural hemorrhage with loss of consciousness status unknown, initial encounter: Secondary | ICD-10-CM

## 2013-06-10 DIAGNOSIS — R55 Syncope and collapse: Secondary | ICD-10-CM

## 2013-06-10 DIAGNOSIS — R9431 Abnormal electrocardiogram [ECG] [EKG]: Secondary | ICD-10-CM | POA: Diagnosis present

## 2013-06-10 DIAGNOSIS — R404 Transient alteration of awareness: Secondary | ICD-10-CM | POA: Diagnosis present

## 2013-06-10 DIAGNOSIS — N390 Urinary tract infection, site not specified: Secondary | ICD-10-CM | POA: Diagnosis present

## 2013-06-10 DIAGNOSIS — I447 Left bundle-branch block, unspecified: Secondary | ICD-10-CM | POA: Diagnosis present

## 2013-06-10 DIAGNOSIS — Z882 Allergy status to sulfonamides status: Secondary | ICD-10-CM | POA: Diagnosis not present

## 2013-06-10 DIAGNOSIS — I10 Essential (primary) hypertension: Secondary | ICD-10-CM | POA: Diagnosis present

## 2013-06-10 DIAGNOSIS — S065X9A Traumatic subdural hemorrhage with loss of consciousness of unspecified duration, initial encounter: Secondary | ICD-10-CM

## 2013-06-10 DIAGNOSIS — I129 Hypertensive chronic kidney disease with stage 1 through stage 4 chronic kidney disease, or unspecified chronic kidney disease: Secondary | ICD-10-CM | POA: Diagnosis present

## 2013-06-10 DIAGNOSIS — E1165 Type 2 diabetes mellitus with hyperglycemia: Secondary | ICD-10-CM

## 2013-06-10 DIAGNOSIS — I951 Orthostatic hypotension: Secondary | ICD-10-CM | POA: Diagnosis present

## 2013-06-10 DIAGNOSIS — I2589 Other forms of chronic ischemic heart disease: Secondary | ICD-10-CM | POA: Diagnosis present

## 2013-06-10 DIAGNOSIS — Z823 Family history of stroke: Secondary | ICD-10-CM | POA: Diagnosis not present

## 2013-06-10 DIAGNOSIS — I214 Non-ST elevation (NSTEMI) myocardial infarction: Principal | ICD-10-CM | POA: Diagnosis present

## 2013-06-10 DIAGNOSIS — F3289 Other specified depressive episodes: Secondary | ICD-10-CM | POA: Diagnosis present

## 2013-06-10 DIAGNOSIS — Z8249 Family history of ischemic heart disease and other diseases of the circulatory system: Secondary | ICD-10-CM

## 2013-06-10 DIAGNOSIS — R7989 Other specified abnormal findings of blood chemistry: Secondary | ICD-10-CM

## 2013-06-10 DIAGNOSIS — E785 Hyperlipidemia, unspecified: Secondary | ICD-10-CM

## 2013-06-10 DIAGNOSIS — IMO0001 Reserved for inherently not codable concepts without codable children: Secondary | ICD-10-CM | POA: Diagnosis present

## 2013-06-10 DIAGNOSIS — E1129 Type 2 diabetes mellitus with other diabetic kidney complication: Secondary | ICD-10-CM

## 2013-06-10 DIAGNOSIS — E119 Type 2 diabetes mellitus without complications: Secondary | ICD-10-CM

## 2013-06-10 DIAGNOSIS — I509 Heart failure, unspecified: Secondary | ICD-10-CM | POA: Diagnosis present

## 2013-06-10 DIAGNOSIS — I959 Hypotension, unspecified: Secondary | ICD-10-CM | POA: Diagnosis present

## 2013-06-10 DIAGNOSIS — Z853 Personal history of malignant neoplasm of breast: Secondary | ICD-10-CM

## 2013-06-10 DIAGNOSIS — I4430 Unspecified atrioventricular block: Secondary | ICD-10-CM | POA: Diagnosis present

## 2013-06-10 DIAGNOSIS — F329 Major depressive disorder, single episode, unspecified: Secondary | ICD-10-CM | POA: Diagnosis present

## 2013-06-10 DIAGNOSIS — R778 Other specified abnormalities of plasma proteins: Secondary | ICD-10-CM

## 2013-06-10 DIAGNOSIS — I480 Paroxysmal atrial fibrillation: Secondary | ICD-10-CM

## 2013-06-10 HISTORY — DX: Paroxysmal atrial fibrillation: I48.0

## 2013-06-10 HISTORY — DX: Unspecified convulsions: R56.9

## 2013-06-10 LAB — URINALYSIS, ROUTINE W REFLEX MICROSCOPIC
BILIRUBIN URINE: NEGATIVE
GLUCOSE, UA: NEGATIVE mg/dL
HGB URINE DIPSTICK: NEGATIVE
Ketones, ur: NEGATIVE mg/dL
Nitrite: NEGATIVE
PH: 5 (ref 5.0–8.0)
Protein, ur: 30 mg/dL — AB
SPECIFIC GRAVITY, URINE: 1.016 (ref 1.005–1.030)
Urobilinogen, UA: 0.2 mg/dL (ref 0.0–1.0)

## 2013-06-10 LAB — CBC WITH DIFFERENTIAL/PLATELET
Band Neutrophils: 0 % (ref 0–10)
Basophils Absolute: 0 10*3/uL (ref 0.0–0.1)
Basophils Relative: 0 % (ref 0–1)
Blasts: 0 %
EOS ABS: 0.9 10*3/uL — AB (ref 0.0–0.7)
EOS PCT: 6 % — AB (ref 0–5)
HCT: 37.3 % (ref 36.0–46.0)
Hemoglobin: 12.8 g/dL (ref 12.0–15.0)
Lymphocytes Relative: 32 % (ref 12–46)
Lymphs Abs: 4.8 10*3/uL — ABNORMAL HIGH (ref 0.7–4.0)
MCH: 32.6 pg (ref 26.0–34.0)
MCHC: 34.3 g/dL (ref 30.0–36.0)
MCV: 94.9 fL (ref 78.0–100.0)
METAMYELOCYTES PCT: 0 %
MONO ABS: 1.5 10*3/uL — AB (ref 0.1–1.0)
MONOS PCT: 10 % (ref 3–12)
Myelocytes: 0 %
NRBC: 0 /100{WBCs}
Neutro Abs: 7.7 10*3/uL (ref 1.7–7.7)
Neutrophils Relative %: 52 % (ref 43–77)
Platelets: 198 10*3/uL (ref 150–400)
Promyelocytes Absolute: 0 %
RBC: 3.93 MIL/uL (ref 3.87–5.11)
RDW: 12.6 % (ref 11.5–15.5)
WBC: 14.9 10*3/uL — ABNORMAL HIGH (ref 4.0–10.5)

## 2013-06-10 LAB — URINE MICROSCOPIC-ADD ON

## 2013-06-10 LAB — RAPID URINE DRUG SCREEN, HOSP PERFORMED
Amphetamines: NOT DETECTED
BENZODIAZEPINES: POSITIVE — AB
Barbiturates: NOT DETECTED
COCAINE: NOT DETECTED
Opiates: NOT DETECTED
Tetrahydrocannabinol: NOT DETECTED

## 2013-06-10 LAB — PROTIME-INR
INR: 1.01 (ref 0.00–1.49)
Prothrombin Time: 13.1 seconds (ref 11.6–15.2)

## 2013-06-10 LAB — POCT I-STAT, CHEM 8
BUN: 28 mg/dL — ABNORMAL HIGH (ref 6–23)
Calcium, Ion: 1.18 mmol/L (ref 1.13–1.30)
Chloride: 104 meq/L (ref 96–112)
Creatinine, Ser: 1.5 mg/dL — ABNORMAL HIGH (ref 0.50–1.10)
Glucose, Bld: 118 mg/dL — ABNORMAL HIGH (ref 70–99)
HCT: 40 % (ref 36.0–46.0)
Hemoglobin: 13.6 g/dL (ref 12.0–15.0)
Potassium: 4.1 meq/L (ref 3.7–5.3)
Sodium: 141 mEq/L (ref 137–147)
TCO2: 19 mmol/L (ref 0–100)

## 2013-06-10 LAB — POCT I-STAT TROPONIN I: Troponin i, poc: 0.01 ng/mL (ref 0.00–0.08)

## 2013-06-10 LAB — TROPONIN I
Troponin I: 0.33 ng/mL (ref <0.30)
Troponin I: 0.37 ng/mL (ref <0.30)

## 2013-06-10 LAB — PHOSPHORUS: Phosphorus: 3.6 mg/dL (ref 2.3–4.6)

## 2013-06-10 LAB — GLUCOSE, CAPILLARY
Glucose-Capillary: 105 mg/dL — ABNORMAL HIGH (ref 70–99)
Glucose-Capillary: 248 mg/dL — ABNORMAL HIGH (ref 70–99)

## 2013-06-10 LAB — MAGNESIUM: Magnesium: 1.4 mg/dL — ABNORMAL LOW (ref 1.5–2.5)

## 2013-06-10 MED ORDER — ATORVASTATIN CALCIUM 10 MG PO TABS
10.0000 mg | ORAL_TABLET | Freq: Every day | ORAL | Status: DC
  Administered 2013-06-10 – 2013-06-12 (×3): 10 mg via ORAL
  Filled 2013-06-10 (×3): qty 1

## 2013-06-10 MED ORDER — SODIUM CHLORIDE 0.9 % IJ SOLN
3.0000 mL | Freq: Two times a day (BID) | INTRAMUSCULAR | Status: DC
  Administered 2013-06-11 – 2013-06-13 (×3): 3 mL via INTRAVENOUS

## 2013-06-10 MED ORDER — ASPIRIN EC 81 MG PO TBEC
81.0000 mg | DELAYED_RELEASE_TABLET | Freq: Every day | ORAL | Status: DC
  Administered 2013-06-11 – 2013-06-17 (×7): 81 mg via ORAL
  Filled 2013-06-10 (×8): qty 1

## 2013-06-10 MED ORDER — MIDAZOLAM HCL 2 MG/2ML IJ SOLN
2.0000 mg | Freq: Once | INTRAMUSCULAR | Status: AC
  Administered 2013-06-10: 2 mg via INTRAVENOUS

## 2013-06-10 MED ORDER — MIDAZOLAM HCL 2 MG/2ML IJ SOLN
INTRAMUSCULAR | Status: AC
  Administered 2013-06-10: 12:00:00 2 mg via INTRAVENOUS
  Filled 2013-06-10: qty 2

## 2013-06-10 MED ORDER — LORAZEPAM 2 MG/ML IJ SOLN: 1.0000 mg | Freq: Once | INTRAMUSCULAR | Status: DC

## 2013-06-10 MED ORDER — CEFTRIAXONE SODIUM 1 G IJ SOLR
1.0000 g | INTRAMUSCULAR | Status: DC
  Administered 2013-06-10 – 2013-06-12 (×3): 1 g via INTRAVENOUS
  Filled 2013-06-10 (×3): qty 10

## 2013-06-10 MED ORDER — SODIUM CHLORIDE 0.9 % IV SOLN
INTRAVENOUS | Status: DC
  Administered 2013-06-10 – 2013-06-12 (×2): via INTRAVENOUS

## 2013-06-10 MED ORDER — ONDANSETRON HCL 4 MG PO TABS: 4.0000 mg | ORAL_TABLET | Freq: Four times a day (QID) | ORAL | Status: DC | PRN

## 2013-06-10 MED ORDER — INSULIN ASPART 100 UNIT/ML ~~LOC~~ SOLN
0.0000 [IU] | Freq: Three times a day (TID) | SUBCUTANEOUS | Status: DC
  Administered 2013-06-11: 7 [IU] via SUBCUTANEOUS
  Administered 2013-06-11: 4 [IU] via SUBCUTANEOUS
  Administered 2013-06-11: 3 [IU] via SUBCUTANEOUS
  Administered 2013-06-12 (×3): 5 [IU] via SUBCUTANEOUS

## 2013-06-10 MED ORDER — ENOXAPARIN SODIUM 30 MG/0.3ML ~~LOC~~ SOLN
30.0000 mg | SUBCUTANEOUS | Status: DC
  Administered 2013-06-10 – 2013-06-13 (×4): 30 mg via SUBCUTANEOUS
  Filled 2013-06-10 (×5): qty 0.3

## 2013-06-10 MED ORDER — ONDANSETRON HCL 4 MG/2ML IJ SOLN: 4.0000 mg | Freq: Four times a day (QID) | INTRAMUSCULAR | Status: DC | PRN

## 2013-06-10 MED ORDER — PANTOPRAZOLE SODIUM 40 MG PO TBEC
40.0000 mg | DELAYED_RELEASE_TABLET | Freq: Every day | ORAL | Status: DC
Start: 2013-06-10 — End: 2013-06-17
  Administered 2013-06-10 – 2013-06-16 (×7): 40 mg via ORAL
  Filled 2013-06-10 (×6): qty 1

## 2013-06-10 MED ORDER — HALOPERIDOL LACTATE 5 MG/ML IJ SOLN
2.0000 mg | Freq: Once | INTRAMUSCULAR | Status: AC
  Administered 2013-06-10: 2 mg via INTRAVENOUS
  Filled 2013-06-10: qty 1

## 2013-06-10 MED FILL — Medication: Qty: 1 | Status: AC

## 2013-06-10 NOTE — ED Provider Notes (Signed)
CSN: [id number]     Arrival date & time 06/10/13  1200 History   First MD Initiated Contact with Patient 06/10/13 1202     Chief Complaint  Patient presents with  . Seizures    post cpr   HPI  73 y/o female with history of CAD s/p CABG (08'), HTN, HLD, DM who presents after in hospital arrest. The patient was visiting a family member in the hospital when she had an apparent syncopal episode. Per family the patient was sitting in a chair when she lost consciousness and leaned to the left. The patient's family member states her arms curled up and her mouth was drooping. The patient was lowered to the floor and no palpable pulse was found by hospital staff members. CPR was initiated and the patient achieved ROSC after several minutes of compressions and supplemental O2. No drugs or shocks were delivered. The patient then began to breath spontaneously and was agitated. The family states she has been in her usual state of health other than a cough. Family reports remote history of seizure after neurosurgery in past. Not currently on antiepileptics.    Past Medical History  Diagnosis Date  . HTN (hypertension)   . CAD (coronary artery disease)     Remote CABG in 2008  . DM (diabetes mellitus)   . HTN (hypertension)   . Atrial fibrillation   . GERD (gastroesophageal reflux disease)   . Venous insufficiency   . Left bundle branch block   . Obesity   . Chronic anticoagulation   . Breast cancer    Past Surgical History  Procedure Laterality Date  . Partial hysterectomy      1985  . Total abdominal hysterectomy  1986  . Coronary artery bypass graft  2008    x3 2008.  [place] (Dr[facility] Wende Neighbors[facility] [phone]  . Cholecystectomy    . Subdural hematoma evacuation via craniotomy  2012    bil x 2   Family History  Problem Relation Age of Onset  . Emphysema Father   . Hypertension Father   . Hypertension Mother   . Stroke Mother   . Diabetes Mother   . Diabetes Brother     History  Substance Use Topics  . Smoking status: Never Smoker   . Smokeless tobacco: Never Used  . Alcohol Use: No   OB History   Grav Para Term Preterm Abortions TAB SAB Ect Mult Living                 Review of Systems  Unable to perform ROS: Mental status change    Allergies  Sulfa antibiotics  Home Medications   No current outpatient prescriptions on file. BP 129/48  Pulse 83  Temp(Src) 97.2 F (36.2 C) (Oral)  Resp 16  SpO2 94% Physical Exam  Constitutional: She appears well-developed. She is uncooperative.  HENT:  Head: Normocephalic and atraumatic.  Eyes: Conjunctivae are normal. Pupils are equal, round, and reactive to light.  Neck: Normal range of motion. Neck supple.  Cardiovascular: Normal heart sounds.  Tachycardia present.  Exam reveals no gallop and no friction rub.   No murmur heard. Pulmonary/Chest: Effort normal and breath sounds normal.  Abdominal: Soft. She exhibits no distension.  Musculoskeletal: Normal range of motion.  Extremities are grossly atraumatic  Neurological: She is alert. GCS eye subscore is 1. GCS verbal subscore is 3. GCS motor subscore is 5.  Moves all extremities. Unable to formally test strength or sensation secondary  to altered mentation.    ED Course  Procedures (including critical care time) Labs Review Labs Reviewed  CBC WITH DIFFERENTIAL - Abnormal; Notable for the following:    WBC 14.9 (*)    Eosinophils Relative 6 (*)    Lymphs Abs 4.8 (*)    Monocytes Absolute 1.5 (*)    Eosinophils Absolute 0.9 (*)    All other components within normal limits  URINALYSIS, ROUTINE W REFLEX MICROSCOPIC - Abnormal; Notable for the following:    APPearance CLOUDY (*)    Protein, ur 30 (*)    Leukocytes, UA MODERATE (*)    All other components within normal limits  URINE MICROSCOPIC-ADD ON - Abnormal; Notable for the following:    Squamous Epithelial / LPF MANY (*)    Bacteria, UA FEW (*)    All other components within normal  limits  MAGNESIUM - Abnormal; Notable for the following:    Magnesium 1.4 (*)    All other components within normal limits  TROPONIN I - Abnormal; Notable for the following:    Troponin I 0.33 (*)    All other components within normal limits  TROPONIN I - Abnormal; Notable for the following:    Troponin I 0.37 (*)    All other components within normal limits  HEMOGLOBIN A1C - Abnormal; Notable for the following:    Hemoglobin A1C 7.5 (*)    Mean Plasma Glucose 169 (*)    All other components within normal limits  URINE RAPID DRUG SCREEN (HOSP PERFORMED) - Abnormal; Notable for the following:    Benzodiazepines POSITIVE (*)    All other components within normal limits  BASIC METABOLIC PANEL - Abnormal; Notable for the following:    Glucose, Bld 240 (*)    GFR calc non Af Amer 49 (*)    GFR calc Af Amer 57 (*)    All other components within normal limits  CBC - Abnormal; Notable for the following:    RBC 3.35 (*)    Hemoglobin 10.7 (*)    HCT 30.7 (*)    Platelets 124 (*)    All other components within normal limits  GLUCOSE, CAPILLARY - Abnormal; Notable for the following:    Glucose-Capillary 105 (*)    All other components within normal limits  GLUCOSE, CAPILLARY - Abnormal; Notable for the following:    Glucose-Capillary 248 (*)    All other components within normal limits  GLUCOSE, CAPILLARY - Abnormal; Notable for the following:    Glucose-Capillary 230 (*)    All other components within normal limits  GLUCOSE, CAPILLARY - Abnormal; Notable for the following:    Glucose-Capillary 215 (*)    All other components within normal limits  GLUCOSE, CAPILLARY - Abnormal; Notable for the following:    Glucose-Capillary 105 (*)    All other components within normal limits  GLUCOSE, CAPILLARY - Abnormal; Notable for the following:    Glucose-Capillary 293 (*)    All other components within normal limits  POCT I-STAT, CHEM 8 - Abnormal; Notable for the following:    BUN 28 (*)     Creatinine, Ser 1.50 (*)    Glucose, Bld 118 (*)    All other components within normal limits  URINE CULTURE  PROTIME-INR  PHOSPHORUS  TSH  TROPONIN I  POCT I-STAT TROPONIN I   Imaging Review Ct Head Wo Contrast  06/10/2013   CLINICAL DATA:  Altered mental status.  EXAM: CT HEAD WITHOUT CONTRAST  TECHNIQUE: Contiguous axial images were  obtained from the base of the skull through the vertex without intravenous contrast.  COMPARISON:  06/11/2011.  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 On the scout images, there is a wire projecting over the skull which is not visualized on the axial images. Mastoid air cells and paranasal sinuses are within normal limits. Intracranial atherosclerosis. No mass lesion, mass effect, midline shift, hydrocephalus, hemorrhage. No acute territorial cortical ischemia/infarct. Atrophy and chronic ischemic white matter disease is present.  IMPRESSION: Atrophy and chronic ischemic white matter disease without acute intracranial abnormality.   Electronically Signed   By: Dereck Ligas M.D.   On: 06/10/2013 13:16   Dg Chest Portable 1 View  06/10/2013   CLINICAL DATA:  Post arrest.  Diabetic hypertensive patient.  EXAM: PORTABLE CHEST - 1 VIEW  COMPARISON:  03/21/2011 chest x-ray.  FINDINGS: Post CABG.  Heart size top-normal.  Pulmonary vascular prominence most notable centrally without frank pulmonary edema.  No segmental infiltrate or gross pneumothorax.  Remote right-sided rib fractures.  IMPRESSION: Post CABG. Heart size top-normal.  Pulmonary vascular prominence most notable centrally without frank pulmonary edema.  Remote right-sided rib fractures.   Electronically Signed   By: Chauncey Cruel M.D.   On: 06/10/2013 13:53    EKG Interpretation    Date/Time:    Ventricular Rate:    PR Interval:    QRS Duration:   QT Interval:    QTC Calculation:   R Axis:     Text Interpretation:             MDM   1. Hyperlipidemia   2. Atrial fibrillation   3. Cardiac  Arrest  On arrival, Airway intact. Bilateral breath sounds. The patient is alert and yelling. She was combative and required both chemical and physical restraint in order to perform MSE. After versed and haldol the patient calmed down and eventually returned back to her baseline mental status. At 13:26 the patient was back to her baseline. She denied complaints. She states she had chest pain two days ago but since that time has been in her usual state of health. CT head normal. EKG without acute ischemic changes. Troponin negative. Labs unremarkable. CXR without acute pathology. The patient remained at her baseline mental status throughout the rest of her ED stay. The patient was admitted to internal medicine for observation.   Donita Brooks, MD 06/11/13 [phone]

## 2013-06-10 NOTE — Significant Event (Signed)
Rapid Response Event Note  Overview:  Called to 2S waiting room for visitor with seizure activity.  Time Called: 1130 Event Type: Neurologic  Initial Focused Assessment:  Stat call to ED to bring stretcher on my way to 2S waiting area. On arrival patient was supine on floor and tech from cath lab performing CPR.  Code Blue called by this Therapist, sports.  Witness reports visitor was sitting in chair - had seizure.  Cath lab tech reports patient's airway was clamped down - not breathing and no pulse detected.  He started CPR.     Interventions:  2S RN's present - called for code cart - airway opened and bagged per RN from 2MW.  CPR continued - pads placed - IV site accessed on second attempt to left wrist - patient started to have spont respirations - monitor showing rhythm of 128 with palpable pulse.  Patient beginning to move all extremities - extremely agiatated - screaming - skin warm and dry - Dr. Elsworth Soho with PCCM present - with pulse and spont respirations - advised to place on stretcher and take to ED ASAP.  Patient lifted to stretcher with blanket and 6 person lift.  Patient extremely agitated -airway patent - strong pulse - transported to ED with Zoll monitor and IV infusing on NRB mask - patient remains extremely agitated - very strong - fighting - Dr. Dorna Mai met [place] in ED - 2 mg Versed IV given per order - both IV's lost with patient agitation - new line to left antecubital per ED staff.  Rhythm show 128 with BBB - strong pulse but too agitated to get BP -  O2 sats 100% on NRB mask - talking but mumbles - incomprehensible words - taking 4-6 staff members to protect patient on stretcher.  Handoff to [employer] and Merchandiser, retail.  Family in conf room - supported by chaplain staff.     Event Summary: Name of Physician Notified:  (code blue team MD) at      at    Outcome: Coded and survived;Transferred (Comment) (ED - visitor in lobby 2S)  Event End Time: 1207  Quin Hoop

## 2013-06-10 NOTE — H&P (Signed)
[facility] History and Physical  Alexandra [name] OMV:[id number] DOB: [birth year] DOA: 06/10/2013  Referring physician: ED physician PCP: Alexandra Shire, MD   Chief Complaint: syncope   HPI:  Pt is 73 yo female with history of CAD s/p CABG (08'), HTN, HLD, DM who presented to [facility] ED after sustaining in hospital arrest. The patient was visiting a family member in the hospital when she had an apparent syncopal episode. Per family the patient was sitting in a chair, suddenly leaned to the left and lost consciousness. Code blue called and initially no palpable pulse found, CPR started and after several minutes of CPR pulse was noted, pt was breathing spontaneously but was agitated and TRH asked to admit for further evaluation. No reported concerns prior to this event. Pt is currently unable to provide history due to agitation and altered mental status.    Assessment and Plan: Altered mental status - secondary to acute event and unclear if any specific triggering event contributed - admit to telemetry bed and obtain UA, urine culture, 12 lead EKG, TSH, UDS, CE's - check orthostatic vitals - hold BP medication that pt takes at home  - UA suggestive of UTI but not clear if pt is asymptomatic otherwise - place on empiric Rocephin  Syncope - unclear etiology - possibly related to dehydration and low BP, ? Orthostatic hypotension as BP was low on admission to ED 80 -90/40's - hold home BP medications including lisinopril, Norvasc, Nebivolol - check 12 lead EKG, TSH, UA, CE's - PT evaluation and monitor on telemetry - place on IVF, gentle hydration with NS ? UTI - management as noted above - follow up on urine culture  HTN - BP soft, holding home medications as noted above HLD - continue statin  Diabetes - appears to be diet controlled - check A1C Acute renal failure - likely pre renal in etiology - holding lisinopril and other BP med's - IVF and repeat BMP in AM  Code Status:  Full Family Communication: Pt at bedside Disposition Plan: Admit to telemetry bed    Review of Systems:  Unable to obtain due to AMS    Past Medical History  Diagnosis Date  . HTN (hypertension)   . CAD (coronary artery disease)     Remote CABG in 2008  . DM (diabetes mellitus)   . HTN (hypertension)   . Atrial fibrillation   . GERD (gastroesophageal reflux disease)   . Breast cancer   . Venous insufficiency   . Left bundle branch block   . Obesity   . Chronic anticoagulation     Past Surgical History  Procedure Laterality Date  . Partial hysterectomy      1985  . Total abdominal hysterectomy  1986  . Coronary artery bypass graft  2008    x3 2008.  [place] (Dr. Wende [name][facility] [phone]  . Cholecystectomy      Social History:  reports that she has never smoked. She has never used smokeless tobacco. She reports that she does not drink alcohol or use illicit drugs.  Allergies  Allergen Reactions  . Sulfa Antibiotics     Family History  Problem Relation Age of Onset  . Emphysema Father   . Hypertension Father   . Hypertension Mother   . Stroke Mother   . Diabetes Mother   . Diabetes Brother     Medication Sig  amLODipine 5 MG tablet Take 10 mg by mouth daily.   aspirin 81 MG tablet Take  81 mg by mouth daily.  atorvastatin (LIPITOR) 20 MG tablet TAKE 1 TABLET BY MOUTH ONCE A DAY  Insulin Aspart (NOVOLOG [place]) Inject into the skin.  lisinopril  5 MG tablet Take 5 mg by mouth daily.  Nebivolol HCl  20 MG TABS Take 20 mg by mouth daily.  pantoprazole  40 MG tablet Take 40 mg by mouth daily.  sertraline (100 MG tablet Take 100 mg by mouth daily.      Physical Exam: Filed Vitals:   06/10/13 1230 06/10/13 1239 06/10/13 1343  BP: 98/49 101/51 132/60  Pulse: 95  93  Resp: 18  18  SpO2: 97%  94%    Physical Exam  Constitutional: Appears well-developed and well-nourished. No distress.  HENT: Normocephalic. External right and left ear  normal. Dry MM Eyes: Conjunctivae and EOM are normal. PERRLA, no scleral icterus.  Neck: Normal ROM. Neck supple. No JVD. No tracheal deviation. No thyromegaly.  CVS: RRR, S1/S2 +, no murmurs, no gallops, no carotid bruit.  Pulmonary: Effort and breath sounds normal, no stridor, rhonchi, wheezes, rales.  Abdominal: Soft. BS +,  no distension, tenderness, rebound or guarding.  Musculoskeletal: Normal range of motion. No edema and no tenderness.  Lymphadenopathy: No lymphadenopathy noted, cervical, inguinal. Neuro: Somnolent but easy to arouse, follows some commands appropriately  Skin: Skin is warm and dry. No rash noted. Not diaphoretic. No erythema. No pallor.  Psychiatric: Unable to obtain due to [place] on Admission:  Basic Metabolic Panel:  Recent Labs Lab 06/10/13 1235  NA 141  K 4.1  CL 104  GLUCOSE 118*  BUN 28*  CREATININE 1.50*   CBC:  Recent Labs Lab 06/10/13 1153 06/10/13 1235  WBC 14.9*  --   NEUTROABS 7.7  --   HGB 12.8 13.6  HCT 37.3 40.0  MCV 94.9  --   PLT 198  --    Radiological Exams on Admission: Ct Head Wo Contrast   06/10/2013   Atrophy and chronic ischemic white matter disease without acute intracranial abnormality.     Dg Chest Portable 1 View  06/10/2013   Post CABG. Heart size top-normal.  Pulmonary vascular prominence most notable centrally without frank pulmonary edema.  Remote right-sided rib fractures.     EKG: Normal sinus rhythm, no ST/T wave changes  Alexandra Ramsay, MD  [facility] Pager [phone]  If 7PM-7AM, please contact night-coverage [email or web address] Password [email or web address] 06/10/2013, 2:23 PM

## 2013-06-10 NOTE — ED Notes (Signed)
Family at bedside and notified of pt situation.  Stated pt has not had seizure since she seized, fell and had a bleed d/t fall in 2012.  VS stable.  Pt sleeping after haldol.

## 2013-06-10 NOTE — Progress Notes (Signed)
Unit CM UR Completed by [facility] ED CM  W. Jody Silas RN  

## 2013-06-10 NOTE — ED Notes (Signed)
X-ray called to complete xray

## 2013-06-10 NOTE — ED Notes (Addendum)
Pt awoke, calm and was able to tell me her name.  Not alert to time or place yet.  Restraints removed.

## 2013-06-10 NOTE — Progress Notes (Signed)
Chaplain responded to code blue in the 2S waiting area, where a visitor had coded. Presented to family and escorted pt's brother-in-law and niece to ED consultation room A. Communicated with ED nursing and assisted with gathering pt's information. Pt has no spouse, adult children are in [place]. Pt's sister is currently in [place]. Pt's niece appears stressed, but calm. She requested prayer. Chaplain provided prayer, emotional and spiritual support, hospitality, and acted as Dentist between family and staff. Family currently in consultation room A. Please page if additional chaplain support is needed.   Ethelene Browns [phone]

## 2013-06-10 NOTE — ED Notes (Addendum)
Pt was sitting next to family when her R arm, curled, she gazed L and she lost consciousness.  Radiology was walking by and noticed pt was blue with labored breathing.  He eased her to the floor and could not find a pulse.  CPR was performed for approx 3 minutes.  Pt awoke and began fighting staff.  When pt arrived in ED pt was screaming, not making sense.

## 2013-06-10 NOTE — ED Provider Notes (Signed)
I saw and evaluated the patient, reviewed the resident's note and I agree with the findings and plan.  EKG Interpretation   At time 12:05, sinus tachycardia at rate 106, LBBB, QTC at 528 msec is prolonged, abn ECG, no sig change except rate faster compared to ECG from 03/21/11      Pt with syncope, possibly seizure activity whiel visiting family in the hospital.  Staff noted no pulses and CPR was briefly done with chest compressions and ventilation.  Pt came to and has been confused, agitated, belligerent with episodes in between of decreased responsiveness.  No priro h/o seizures.  No sig h/o feeling unwell recently per family.  Lungs clear, HR is tachycardic with strong peripheral pulses. She is disoriented, uncooperative at present, but protecting airway.  She is restrained for now in order to perform diagnostic tests, will also need chemical restraint as verbal instructions, she is unable to follow and she has already pulled out her first IV.  Will need cardiac eval, head CT, labs, drug screens, and admission.    CRITICAL CARE Performed by: Donzetta Matters Total critical care time: 30 min Critical care time was exclusive of separately billable procedures and treating other patients. Critical care was necessary to treat or prevent imminent or life-threatening deterioration. Critical care was time spent personally by me on the following activities: development of treatment plan with patient and/or surrogate as well as nursing, discussions with consultants, evaluation of patient's response to treatment, examination of patient, obtaining history from patient or surrogate, ordering and performing treatments and interventions, ordering and review of laboratory studies, ordering and review of radiographic studies, pulse oximetry and re-evaluation of patient's condition.   12:44 PM Pt is more sedated, still protecting airway, will proceed with scans, labs.     1:28 PM Pt is more awake, alert,  recalls visiting family, her sister prior to her heart surgery.  Pt had some atypical CP a few days ago, none today that she can recall.  Repeat ECG shows SR, regular rate, QTC remains prolonged.  Will need admission.  Saddie Benders. Jacqualin Shirkey, MD 06/10/13 1329

## 2013-06-11 ENCOUNTER — Inpatient Hospital Stay (HOSPITAL_COMMUNITY): Payer: PRIVATE HEALTH INSURANCE

## 2013-06-11 ENCOUNTER — Encounter (HOSPITAL_COMMUNITY): Payer: Self-pay | Admitting: Physical Therapy

## 2013-06-11 DIAGNOSIS — R55 Syncope and collapse: Secondary | ICD-10-CM

## 2013-06-11 DIAGNOSIS — I10 Essential (primary) hypertension: Secondary | ICD-10-CM

## 2013-06-11 DIAGNOSIS — I2510 Atherosclerotic heart disease of native coronary artery without angina pectoris: Secondary | ICD-10-CM

## 2013-06-11 DIAGNOSIS — I6200 Nontraumatic subdural hemorrhage, unspecified: Secondary | ICD-10-CM

## 2013-06-11 LAB — CBC
HCT: 30.7 % — ABNORMAL LOW (ref 36.0–46.0)
Hemoglobin: 10.7 g/dL — ABNORMAL LOW (ref 12.0–15.0)
MCH: 31.9 pg (ref 26.0–34.0)
MCHC: 34.9 g/dL (ref 30.0–36.0)
MCV: 91.6 fL (ref 78.0–100.0)
Platelets: 124 10*3/uL — ABNORMAL LOW (ref 150–400)
RBC: 3.35 MIL/uL — ABNORMAL LOW (ref 3.87–5.11)
RDW: 12.6 % (ref 11.5–15.5)
WBC: 5.7 10*3/uL (ref 4.0–10.5)

## 2013-06-11 LAB — BASIC METABOLIC PANEL
BUN: 20 mg/dL (ref 6–23)
CO2: 26 meq/L (ref 19–32)
CREATININE: 1.1 mg/dL (ref 0.50–1.10)
Calcium: 8.6 mg/dL (ref 8.4–10.5)
Chloride: 102 mEq/L (ref 96–112)
GFR calc Af Amer: 57 mL/min — ABNORMAL LOW (ref >90)
GFR calc non Af Amer: 49 mL/min — ABNORMAL LOW (ref >90)
Glucose, Bld: 240 mg/dL — ABNORMAL HIGH (ref 70–99)
Potassium: 4 mEq/L (ref 3.7–5.3)
Sodium: 139 mEq/L (ref 137–147)

## 2013-06-11 LAB — HEMOGLOBIN A1C
HEMOGLOBIN A1C: 7.5 % — AB (ref <5.7)
MEAN PLASMA GLUCOSE: 169 mg/dL — AB (ref <117)

## 2013-06-11 LAB — GLUCOSE, CAPILLARY
GLUCOSE-CAPILLARY: 230 mg/dL — AB (ref 70–99)
GLUCOSE-CAPILLARY: 293 mg/dL — AB (ref 70–99)
GLUCOSE-CAPILLARY: 314 mg/dL — AB (ref 70–99)
Glucose-Capillary: 105 mg/dL — ABNORMAL HIGH (ref 70–99)
Glucose-Capillary: 215 mg/dL — ABNORMAL HIGH (ref 70–99)
Glucose-Capillary: 252 mg/dL — ABNORMAL HIGH (ref 70–99)

## 2013-06-11 LAB — URINE CULTURE: Colony Count: 50000

## 2013-06-11 LAB — TROPONIN I: Troponin I: <0.3 ng/mL (ref <0.30)

## 2013-06-11 LAB — TSH: TSH: 2.297 u[IU]/mL (ref 0.350–4.500)

## 2013-06-11 MED ORDER — AMLODIPINE BESYLATE 5 MG PO TABS
5.0000 mg | ORAL_TABLET | Freq: Every day | ORAL | Status: DC
  Administered 2013-06-12 – 2013-06-17 (×6): 5 mg via ORAL
  Filled 2013-06-11 (×7): qty 1

## 2013-06-11 MED ORDER — INSULIN ASPART 100 UNIT/ML ~~LOC~~ SOLN
3.0000 [IU] | Freq: Once | SUBCUTANEOUS | Status: AC
  Administered 2013-06-11: 3 [IU] via SUBCUTANEOUS

## 2013-06-11 MED ORDER — NEBIVOLOL HCL 10 MG PO TABS
20.0000 mg | ORAL_TABLET | Freq: Two times a day (BID) | ORAL | Status: DC
  Administered 2013-06-11 – 2013-06-17 (×12): 20 mg via ORAL
  Filled 2013-06-11 (×14): qty 2

## 2013-06-11 NOTE — Evaluation (Signed)
Physical Therapy Evaluation Patient Details Name: Alexandra [name] MRN: [id number] DOB: Jul 21, 1940 Today's Date: 06/11/2013 Time: 6644-0347 PT Time Calculation (min): 18 min  PT Assessment / Plan / Recommendation History of Present Illness  73 y.o. female admitted to [facility] on 06/10/13 after syncopal episode with seizure like activity while visiting her sister in the hospital.  Code Blue called and CPR initiated.  Pt started breathing, regained a pulse and became combative with staff trying to assist her on the gurney to the ED.  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  It looks like reviewing her chart, she had an inpatient rehab stay afterwards.    Clinical Impression  Pt is mobilizing well on her own power.  She is a bit sore from CPR and being restrained just after coming to due to being combative with staff.  Orthostatic vitals taken and are negative.  VSS throughout gait.  Pt does not need any acute or f/u PT at this time.  PT to sign off.      PT Assessment  Patent does not need any further PT services    Follow Up Recommendations  No PT follow up    Does the patient have the potential to tolerate intense rehabilitation     NA  Barriers to Discharge   None      Equipment Recommendations  None recommended by PT    Recommendations for Other Services   None  Frequency   NA- one time eval and d/c    Precautions / Restrictions Precautions Precautions: Other (comment) Precaution Comments: seizure   Pertinent Vitals/Pain See vitals flow sheet. VSS throughout session on RA.        Mobility  Bed Mobility Bed Mobility: Supine to Sit;Sitting - Scoot to Edge of Bed Supine to Sit: HOB flat;6: Modified independent (Device/Increase time);With rails Sitting - Scoot to Edge of Bed: 6: Modified independent (Device/Increase time);With rail Details for Bed Mobility Assistance: pt using railing to get to sitting EOB.   Transfers Transfers: Sit to Stand;Stand to Sit Sit to Stand: 7: Independent;From bed Stand to Sit: 7: Independent;To bed Details for Transfer Assistance: no difficulty getting to standing.  No reports of lightheadedness during transitions.  Ambulation/Gait Ambulation/Gait Assistance: 7: Independent Ambulation Distance (Feet): 300 Feet Assistive device: None Ambulation/Gait Assistance Details: good speed, good gait pattern.  VSS Gait Pattern: Within Functional Limits Gait velocity: WFL        PT Goals(Current goals can be found in the care plan section) Acute Rehab PT Goals Patient Stated Goal: to figure out why this happened and go home PT Goal Formulation: No goals set, d/c therapy  Visit Information  Last PT Received On: 06/11/13 Assistance Needed: +1 History of Present Illness: 73 y.o. female admitted to [facility] on 06/10/13 after syncopal episode with seizure like activity while visiting her sister in the hospital.  Code Blue called and CPR initiated.  Pt started breathing, regained a pulse and became combative with staff trying to assist her on the gurney to the ED.  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  It looks like reviewing her chart, she had an inpatient rehab stay afterwards.         Prior [place] expects to be discharged to:: Private residence Living Arrangements: Other relatives (brother) Available Help at Discharge: Family;Available PRN/intermittently Type of  Home: House Home Access: Stairs to enter [employer] of Steps: 3 Entrance Stairs-Rails: Right Home Layout: One level Prior Function Level of Independence: Independent Comments: per pt she is completely independent, her brain surgery did not leave her with any residual visual or physical deficits, she drives, does not work, and generally does household chores (she reports she cleans and her brother  cooks at home).   Communication Communication: No difficulties Dominant Hand: Right    Cognition  Cognition Arousal/Alertness: Awake/alert Behavior During Therapy: WFL for tasks assessed/performed Overall Cognitive Status: Within Functional Limits for tasks assessed    Extremity/Trunk Assessment Upper Extremity Assessment Upper Extremity Assessment: Overall WFL for tasks assessed Lower Extremity Assessment Lower Extremity Assessment: Overall WFL for tasks assessed Cervical / Trunk Assessment Cervical / Trunk Assessment: Normal   Balance Balance Balance Assessed: Yes Static Sitting Balance Static Sitting - Balance Support: No upper extremity supported;Feet supported Static Sitting - Level of Assistance: 7: Independent Static Standing Balance Static Standing - Balance Support: No upper extremity supported Static Standing - Level of Assistance: 7: Independent Dynamic Standing Balance Dynamic Standing - Balance Support: No upper extremity supported Dynamic Standing - Level of Assistance: 7: Independent  End of Session PT - End of Session Equipment Utilized During Treatment: Gait belt Activity Tolerance: Patient tolerated treatment well Patient left: in bed;with call bell/phone within reach;Other (comment) (seated EOB) Nurse Communication: Mobility status;Other (comment) (pt ok to walk with friends/family on the unit)    Wells Guiles B. [place], [place], DPT [phone]   06/11/2013, 11:20 AM

## 2013-06-11 NOTE — Progress Notes (Signed)
[facility] PROGRESS NOTE  Alexandra [name] VEL:[id number] DOB: [birth year] DOA: 06/10/2013 PCP: Stephens Shire, MD  Assessment/Plan: 1. Syncope vs seizure- Will get neurology consultation, for possible seizure. We'll also obtain EEG 2. Prolonged QTc interval- patient's QTc interval was prolonged to 557, we'll get carotid consultation for possibility of arrhythmia causing syncope 3. ? UTI- patient was started on empiric Rocephin for possible UTI. Urine culture growing monocytes, will discontinue Rocephin at this time. 4. Hypertension. will restart patient's antihypertensive medications  5. DVT Prophylaxis- Lovenox  Code Status: *Full code Family Communication: *Discussed with patient in detail Disposition Plan: Home when stable   Consultants:  Neurology   Procedures:  EEG  Antibiotics:  none  HPI/Subjective:  patient seen and examined, admitted with syncope versus seizure.  Objective: Filed Vitals:   06/11/13 1354  BP: 129/48  Pulse: 83  Temp: 97.2 F (36.2 C)  Resp: 16    Intake/Output Summary (Last 24 hours) at 06/11/13 1817 Last data filed at 06/11/13 0900  Gross per 24 hour  Intake 1386.25 ml  Output      0 ml  Net 1386.25 ml   There were no vitals filed for this visit.  Exam:  Physical Exam: Head: Normocephalic, atraumatic.  Eyes: No signs of jaundice, EOMI Nose: Mucous membranes dry.  Throat: Oropharynx nonerythematous, no exudate appreciated.  Neck: supple,No deformities, masses, or tenderness noted. Lungs: Normal respiratory effort. B/L Clear to auscultation, no crackles or wheezes.  Heart: Regular RR. S1 and S2 normal  Abdomen: BS normoactive. Soft, Nondistended, non-tender.  Extremities: No pretibial edema, no erythema   Data Reviewed: Basic Metabolic Panel:  Recent Labs Lab 06/10/13 1235 06/10/13 2010 06/11/13 0430  NA 141  --  139  K 4.1  --  4.0  CL 104  --  102  CO2  --   --  26  GLUCOSE 118*  --  240*  BUN 28*  --  20   CREATININE 1.50*  --  1.10  CALCIUM  --   --  8.6  MG  --  1.4*  --   PHOS  --  3.6  --    Liver Function Tests: No results found for this basename: AST, ALT, ALKPHOS, BILITOT, PROT, ALBUMIN,  in the last 168 hours No results found for this basename: LIPASE, AMYLASE,  in the last 168 hours No results found for this basename: AMMONIA,  in the last 168 hours CBC:  Recent Labs Lab 06/10/13 1153 06/10/13 1235 06/11/13 0430  WBC 14.9*  --  5.7  NEUTROABS 7.7  --   --   HGB 12.8 13.6 10.7*  HCT 37.3 40.0 30.7*  MCV 94.9  --  91.6  PLT 198  --  124*   Cardiac Enzymes:  Recent Labs Lab 06/10/13 2010 06/10/13 2215 06/11/13 0430  TROPONINI 0.33* 0.37* <0.30   BNP (last 3 results) No results found for this basename: PROBNP,  in the last 8760 hours CBG:  Recent Labs Lab 06/10/13 2059 06/11/13 0030 06/11/13 0720 06/11/13 1119 06/11/13 1713  GLUCAP 248* 230* 215* 293* 314*    Recent Results (from the past 240 hour(s))  URINE CULTURE     Status: None   Collection Time    06/10/13  1:56 PM      Result Value Range Status   Specimen Description URINE, CLEAN CATCH   Final   Special Requests NONE   Final   Culture  Setup Time     Final   Value: 06/10/2013  15:57     Performed at [facility]     Final   Value: 50,000 COLONIES/ML     Performed at [facility]   Culture     Final   Value: Multiple bacterial morphotypes present, none predominant. Suggest appropriate recollection if clinically indicated.     Performed at [facility]   Report Status 06/11/2013 FINAL   Final     Studies: Ct Head Wo Contrast  06/10/2013   CLINICAL DATA:  Altered mental status.  EXAM: CT HEAD WITHOUT CONTRAST  TECHNIQUE: Contiguous axial images were obtained from the base of the skull through the vertex without intravenous contrast.  COMPARISON:  06/11/2011.  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 On the scout images, there is a wire projecting over the  skull which is not visualized on the axial images. Mastoid air cells and paranasal sinuses are within normal limits. Intracranial atherosclerosis. No mass lesion, mass effect, midline shift, hydrocephalus, hemorrhage. No acute territorial cortical ischemia/infarct. Atrophy and chronic ischemic white matter disease is present.  IMPRESSION: Atrophy and chronic ischemic white matter disease without acute intracranial abnormality.   Electronically Signed   By: Dereck Ligas M.D.   On: 06/10/2013 13:16   Dg Chest Portable 1 View  06/10/2013   CLINICAL DATA:  Post arrest.  Diabetic hypertensive patient.  EXAM: PORTABLE CHEST - 1 VIEW  COMPARISON:  03/21/2011 chest x-ray.  FINDINGS: Post CABG.  Heart size top-normal.  Pulmonary vascular prominence most notable centrally without frank pulmonary edema.  No segmental infiltrate or gross pneumothorax.  Remote right-sided rib fractures.  IMPRESSION: Post CABG. Heart size top-normal.  Pulmonary vascular prominence most notable centrally without frank pulmonary edema.  Remote right-sided rib fractures.   Electronically Signed   By: Chauncey Cruel M.D.   On: 06/10/2013 13:53    Scheduled Meds: . aspirin EC  81 mg Oral Daily  . atorvastatin  10 mg Oral q1800  . cefTRIAXone (ROCEPHIN)  IV  1 g Intravenous Q24H  . enoxaparin (LOVENOX) injection  30 mg Subcutaneous Q24H  . insulin aspart  0-9 Units Subcutaneous TID WC  . pantoprazole  40 mg Oral Daily  . sodium chloride  3 mL Intravenous Q12H   Continuous Infusions: . sodium chloride 75 mL/hr at 06/10/13 1742    Active Problems:   Syncope    Time spent: 25 min    [facility] Pager [phone]. If 7PM-7AM, please contact night-coverage at [email or web address], password [email or web address] 06/11/2013, 6:17 PM  LOS: 1 day

## 2013-06-11 NOTE — Progress Notes (Signed)
EEG Completed; Results Pending  

## 2013-06-11 NOTE — Consult Note (Signed)
CARDIOLOGY CONSULT NOTE    Patient ID: Alexandra [name] MRN: [id number], DOB/AGE: 1940/09/21   Admit date: 06/10/2013 Date of Consult: 06/11/2013   Primary Physician: Stephens Shire, MD Primary Cardiologist: Mertie Moores, MD  Pt. Profile  Syncope, long QT  Problem List  Past Medical History  Diagnosis Date  . HTN (hypertension)   . CAD (coronary artery disease)     Remote CABG in 2008  . DM (diabetes mellitus)   . HTN (hypertension)   . Atrial fibrillation   . GERD (gastroesophageal reflux disease)   . Venous insufficiency   . Left bundle branch block   . Obesity   . Chronic anticoagulation   . Breast cancer     Past Surgical History  Procedure Laterality Date  . Partial hysterectomy      1985  . Total abdominal hysterectomy  1986  . Coronary artery bypass graft  2008    x3 2008.  [place] (Dr. Wende Neighbors) [facility] [phone]  . Cholecystectomy    . Subdural hematoma evacuation via craniotomy  2012    bil x 2     Allergies  Allergies  Allergen Reactions  . Sulfa Antibiotics Rash   HPI   Alexandra [name] is an 73 y.o. [name] with a past medical history significant for HTN, DM, atrial fibrillation, CAD s/p CABG, breast cancer, s/p removal bilateral SDH 2 years ago complicated by an isolated post op seizure ( took keppra for 1 year), admitted to [facility] after a probable syncopal episode yesterday.  She was visiting her sister in the hospital, sitting in a chair, when she suddenly leaned to the left and lost consciousness. Code blue called and initially no palpable pulse found, CPR started and after 1 minute of CPR pulse was noted, pt was breathing spontaneously but was agitated. The patient has no recollection about the episode, she denies any chest pain or palpitations prior to this. Neurology evaluated the patient, CT brain showed no acute abnormality. She usually follows with Dr Acie Fredrickson once a year, she last saw him in February 2014 and was scheduled to see  him on 06/20/2013.  Presently, she denies chest pain, SOB, she admits to occasional few seconds lasting palpitations, but associated dizziness.  Inpatient Medications  . amLODipine  5 mg Oral Daily  . aspirin EC  81 mg Oral Daily  . atorvastatin  10 mg Oral q1800  . cefTRIAXone (ROCEPHIN)  IV  1 g Intravenous Q24H  . enoxaparin (LOVENOX) injection  30 mg Subcutaneous Q24H  . insulin aspart  0-9 Units Subcutaneous TID WC  . nebivolol  20 mg Oral BID  . pantoprazole  40 mg Oral Daily  . sodium chloride  3 mL Intravenous Q12H    Family History Family History  Problem Relation Age of Onset  . Emphysema Father   . Hypertension Father   . Hypertension Mother   . Stroke Mother   . Diabetes Mother   . Diabetes Brother      Social History History   Social History  . Marital Status: Widowed    Spouse Name: N/A    Number of Children: N/A  . Years of Education: N/A   Occupational History  . Not on file.   Social History Main Topics  . Smoking status: Never Smoker   . Smokeless tobacco: Never Used  . Alcohol Use: No  . Drug Use: No  . Sexual Activity: Not on file   Other Topics Concern  . Not on file  Social History Narrative  . No narrative on file     Review of Systems  General:  No chills, fever, night sweats or weight changes.  Cardiovascular:  No chest pain, dyspnea on exertion, edema, orthopnea, palpitations, paroxysmal nocturnal dyspnea. Dermatological: No rash, lesions/masses Respiratory: No cough, dyspnea Urologic: No hematuria, dysuria Abdominal:   No nausea, vomiting, diarrhea, bright red blood per rectum, melena, or hematemesis Neurologic:  No visual changes, wkns, changes in mental status. All other systems reviewed and are otherwise negative except as noted above.  Physical Exam  Blood pressure 129/48, pulse 83, temperature 97.2 F (36.2 C), temperature source Oral, resp. rate 16, SpO2 94.00%.  General: Pleasant, NAD Psych: Normal affect. Neuro:  Alert and oriented X 3. Moves all extremities spontaneously. HEENT: Normal  Neck: Supple without bruits or JVD. Lungs:  Resp regular and unlabored, CTA. Heart: RRR no s3, s4, or murmurs. Abdomen: Soft, non-tender, non-distended, BS + x 4.  Extremities: No clubbing, cyanosis or edema. DP/PT/Radials 2+ and equal bilaterally.  Labs   Recent Labs  06/10/13 2010 06/10/13 2215 06/11/13 0430  TROPONINI 0.33* 0.37* <0.30   Lab Results  Component Value Date   WBC 5.7 06/11/2013   HGB 10.7* 06/11/2013   HCT 30.7* 06/11/2013   MCV 91.6 06/11/2013   PLT 124* 06/11/2013    Recent Labs Lab 06/11/13 0430  NA 139  K 4.0  CL 102  CO2 26  BUN 20  CREATININE 1.10  CALCIUM 8.6  GLUCOSE 240*   Lab Results  Component Value Date   CHOL 127 11/02/2011   HDL 49.50 11/02/2011   LDLCALC 63 11/02/2011   TRIG 72.0 11/02/2011   Lab Results  Component Value Date   DDIMER  Value: 0.23        AT THE INHOUSE ESTABLISHED CUTOFF VALUE OF 0.48 ug/mL FEU, THIS ASSAY HAS BEEN DOCUMENTED IN THE LITERATURE TO HAVE A SENSITIVITY AND NEGATIVE PREDICTIVE VALUE OF AT LEAST 98 TO 99%.  THE TEST RESULT SHOULD BE CORRELATED WITH AN ASSESSMENT OF THE CLINICAL PROBABILITY OF DVT / VTE. 04/21/2009   No components found with this basename: POCBNP,   Radiology/Studies  Ct Head Wo Contrast  06/10/2013   CLINICAL DATA:  Altered mental status.  EXAM: CT HEAD WITHOUT CONTRAST  TECHNIQUE: Contiguous axial images were obtained from the base of the skull through the vertex without intravenous contrast.  COMPARISON:  06/11/2011.  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 On the scout images, there is a wire projecting over the skull which is not visualized on the axial images. Mastoid air cells and paranasal sinuses are within normal limits. Intracranial atherosclerosis. No mass lesion, mass effect, midline shift, hydrocephalus, hemorrhage. No acute territorial cortical ischemia/infarct. Atrophy and chronic ischemic white matter disease is  present.  IMPRESSION: Atrophy and chronic ischemic white matter disease without acute intracranial abnormality.   Electronically Signed   By: Dereck Ligas M.D.   On: 06/10/2013 13:16   Dg Chest Portable 1 View  06/10/2013   CLINICAL DATA:  Post arrest.  Diabetic hypertensive patient.  EXAM: PORTABLE CHEST - 1 VIEW  COMPARISON:  03/21/2011 chest x-ray.  FINDINGS: Post CABG.  Heart size top-normal.  Pulmonary vascular prominence most notable centrally without frank pulmonary edema.  No segmental infiltrate or gross pneumothorax.  Remote right-sided rib fractures.  IMPRESSION: Post CABG. Heart size top-normal.  Pulmonary vascular prominence most notable centrally without frank pulmonary edema.  Remote right-sided rib fractures.   Electronically Signed   By: Chauncey Cruel  M.D.   On: 06/10/2013 13:53   Echocardiogram  - none  ECG: SR, LBBB, QTc 520 ms  Tele: SR, ns VT 4 beats     ASSESSMENT AND PLAN  73 year old [name]   1. Witnessed syncopal episode - no evidence of stroke, long QT, but not significantly changed from the prior ECG on 07/25/2012. Possible nsVT on telemetry (difficult to distinguish from motion), we will continue to monitor, continue BB  2. NSTEMI - h/o CAD, s/p CABG in 2008, no recent ischemic work up, we would recommend a stress test. No echocardiogram in epic, we will order.  3.  H/o paroxysmal atrial fibrillation - not a Coumadin candidate because of a series of falls and subdural hematoma 2 years ago  4. Diabetes mellitus   5. Hyperlipidemia - continue atorvastatin  6. History of breast cancer    Signed, Dorothy Spark, MD, [facility] 06/11/2013, 7:26 PM

## 2013-06-11 NOTE — Consult Note (Signed)
NEURO HOSPITALIST CONSULT NOTE    Reason for Consult: seizure versus syncope  HPI:                                                                                                                                          Alexandra [name] is an 73 y.o. female with a past medical history significant for HTN, DM, atria fibrillation, CAD s/p CABG, breast cancer, s/p removal bilateral SDH 2 years ago complicated by an isolated post op seizure ( took keppra for 1 year), admitted to [facility] after a probable syncopal episode yesterday. She said that she doesn't recall ever passing out and has not recollection of what happened yesterday. According to chart review, she was " visiting her sister in the hospital,sitting in a chair, suddenly leaned to the left and lost consciousness. Code blue called and initially no palpable pulse found, CPR started and after several minutes of CPR pulse was noted, pt was breathing spontaneously but was agitated". CT brain showed no acute abnormality and no evidence of significant metabolic derangements. Presently, she denies HA, vertigo, double vision, difficulty swallowing, focal weakness, slurred speech, language or vision impairment. No chest pain or palpitations.     Past Medical History  Diagnosis Date  . HTN (hypertension)   . CAD (coronary artery disease)     Remote CABG in 2008  . DM (diabetes mellitus)   . HTN (hypertension)   . Atrial fibrillation   . GERD (gastroesophageal reflux disease)   . Venous insufficiency   . Left bundle branch block   . Obesity   . Chronic anticoagulation   . Breast cancer     Past Surgical History  Procedure Laterality Date  . Partial hysterectomy      1985  . Total abdominal hysterectomy  1986  . Coronary artery bypass graft  2008    x3 2008.  [place] (Dr. Wende Neighbors) [facility] [phone]  . Cholecystectomy    . Subdural hematoma evacuation via craniotomy  2012    bil x 2    Family  History  Problem Relation Age of Onset  . Emphysema Father   . Hypertension Father   . Hypertension Mother   . Stroke Mother   . Diabetes Mother   . Diabetes Brother     Social History:  reports that she has never smoked. She has never used smokeless tobacco. She reports that she does not drink alcohol or use illicit drugs.  Allergies  Allergen Reactions  . Sulfa Antibiotics Rash    MEDICATIONS:  I have reviewed the patient's current medications.   ROS:                                                                                                                                       History obtained from the patient and chart review.  General ROS: negative for - chills, fatigue, fever, night sweats, weight gain or weight loss Psychological ROS: negative for - behavioral disorder, hallucinations, memory difficulties, mood swings or suicidal ideation Ophthalmic ROS: negative for - blurry vision, double vision, eye pain or loss of vision ENT ROS: negative for - epistaxis, nasal discharge, oral lesions, sore throat, tinnitus or vertigo Allergy and Immunology ROS: negative for - hives or itchy/watery eyes Hematological and Lymphatic ROS: negative for - bleeding problems, bruising or swollen lymph nodes Endocrine ROS: negative for - galactorrhea, hair pattern changes, polydipsia/polyuria or temperature intolerance Respiratory ROS: negative for - cough, hemoptysis, shortness of breath or wheezing Cardiovascular ROS: negative for - chest pain, dyspnea on exertion, edema or irregular heartbeat Gastrointestinal ROS: negative for - abdominal pain, diarrhea, hematemesis, nausea/vomiting or stool incontinence Genito-Urinary ROS: negative for - dysuria, hematuria, incontinence or urinary frequency/urgency Musculoskeletal ROS: negative for - joint swelling or muscular  weakness Neurological ROS: as noted in HPI Dermatological ROS: negative for rash and skin lesion changes   Physical exam: pleasant female in no apparent distress.Blood pressure 129/48, pulse 83, temperature 97.2 F (36.2 C), temperature source Oral, resp. rate 16, SpO2 94.00%.  Head: normocephalic. Neck: supple, no bruits, no JVD. Cardiac: no murmurs. Lungs: clear. Abdomen: soft, no tender, no mass. Extremities: no edema.  Neurologic Examination:                                                                                                      Mental Status: Alert, oriented, thought content appropriate.  Speech fluent without evidence of aphasia.  Able to follow 3 step commands without difficulty. Cranial Nerves: II: Discs flat bilaterally; Visual fields grossly normal, pupils equal, round, reactive to light and accommodation III,IV, VI: ptosis not present, extra-ocular motions intact bilaterally V,VII: smile symmetric, facial light touch sensation normal bilaterally VIII: hearing normal bilaterally IX,X: gag reflex present XI: bilateral shoulder shrug XII: midline tongue extension without atrophy or fasciculations  Motor: 1+ all over Plantars: Right: downgoing   Left: downgoing Cerebellar: normal finger-to-nose,  normal heel-to-shin test Gait: No tested CV: pulses palpable throughout    Lab Results  Component Value Date/Time   CHOL 127 11/02/2011  9:20 AM    Results for orders placed during the hospital encounter of 06/10/13 (from the past 48 hour(s))  CBC WITH DIFFERENTIAL     Status: Abnormal   Collection Time    06/10/13 11:53 AM      Result Value Range   WBC 14.9 (*) 4.0 - 10.5 K/uL   RBC 3.93  3.87 - 5.11 MIL/uL   Hemoglobin 12.8  12.0 - 15.0 g/dL   HCT 37.3  36.0 - 46.0 %   MCV 94.9  78.0 - 100.0 fL   MCH 32.6  26.0 - 34.0 pg   MCHC 34.3  30.0 - 36.0 g/dL   RDW 12.6  11.5 - 15.5 %   Platelets 198  150 - 400 K/uL   Neutrophils Relative % 52  43 - 77 %    Lymphocytes Relative 32  12 - 46 %   Monocytes Relative 10  3 - 12 %   Eosinophils Relative 6 (*) 0 - 5 %   Basophils Relative 0  0 - 1 %   Band Neutrophils 0  0 - 10 %   Metamyelocytes Relative 0     Myelocytes 0     Promyelocytes Absolute 0     Blasts 0     nRBC 0  0 /100 WBC   Neutro Abs 7.7  1.7 - 7.7 K/uL   Lymphs Abs 4.8 (*) 0.7 - 4.0 K/uL   Monocytes Absolute 1.5 (*) 0.1 - 1.0 K/uL   Eosinophils Absolute 0.9 (*) 0.0 - 0.7 K/uL   Basophils Absolute 0.0  0.0 - 0.1 K/uL   WBC Morphology ATYPICAL LYMPHOCYTES    PROTIME-INR     Status: None   Collection Time    06/10/13 11:53 AM      Result Value Range   Prothrombin Time 13.1  11.6 - 15.2 seconds   INR 1.01  0.00 - 1.49  GLUCOSE, CAPILLARY     Status: Abnormal   Collection Time    06/10/13 11:54 AM      Result Value Range   Glucose-Capillary 105 (*) 70 - 99 mg/dL   Comment 1 Orig Pt Id entered as [id number]    POCT I-STAT TROPONIN I     Status: None   Collection Time    06/10/13 12:33 PM      Result Value Range   Troponin i, poc 0.01  0.00 - 0.08 ng/mL   Comment 3            Comment: Due to the release kinetics of cTnI,     a negative result within the first hours     of the onset of symptoms does not rule out     myocardial infarction with certainty.     If myocardial infarction is still suspected,     repeat the test at appropriate intervals.  POCT I-STAT, CHEM 8     Status: Abnormal   Collection Time    06/10/13 12:35 PM      Result Value Range   Sodium 141  137 - 147 mEq/L   Potassium 4.1  3.7 - 5.3 mEq/L   Chloride 104  96 - 112 mEq/L   BUN 28 (*) 6 - 23 mg/dL   Creatinine, Ser 1.50 (*) 0.50 - 1.10 mg/dL   Glucose, Bld 118 (*) 70 - 99 mg/dL   Calcium, Ion 1.18  1.13 - 1.30 mmol/L   TCO2 19  0 - 100 mmol/L   Hemoglobin 13.6  12.0 -  15.0 g/dL   HCT 40.0  36.0 - 46.0 %  URINALYSIS, ROUTINE W REFLEX MICROSCOPIC     Status: Abnormal   Collection Time    06/10/13  1:56 PM      Result Value Range   Color, Urine  YELLOW  YELLOW   APPearance CLOUDY (*) CLEAR   Specific Gravity, Urine 1.016  1.005 - 1.030   pH 5.0  5.0 - 8.0   Glucose, UA NEGATIVE  NEGATIVE mg/dL   Hgb urine dipstick NEGATIVE  NEGATIVE   Bilirubin Urine NEGATIVE  NEGATIVE   Ketones, ur NEGATIVE  NEGATIVE mg/dL   Protein, ur 30 (*) NEGATIVE mg/dL   Urobilinogen, UA 0.2  0.0 - 1.0 mg/dL   Nitrite NEGATIVE  NEGATIVE   Leukocytes, UA MODERATE (*) NEGATIVE  URINE MICROSCOPIC-ADD ON     Status: Abnormal   Collection Time    06/10/13  1:56 PM      Result Value Range   Squamous Epithelial / LPF MANY (*) RARE   WBC, UA 3-6  <3 WBC/hpf   RBC / HPF 0-2  <3 RBC/hpf   Bacteria, UA FEW (*) RARE  URINE CULTURE     Status: None   Collection Time    06/10/13  1:56 PM      Result Value Range   Specimen Description URINE, CLEAN CATCH     Special Requests NONE     Culture  Setup Time       Value: 06/10/2013 15:57     Performed at [place]       Value: 50,000 COLONIES/ML     Performed at [facility]   Culture       Value: Multiple bacterial morphotypes present, none predominant. Suggest appropriate recollection if clinically indicated.     Performed at [facility]   Report Status 06/11/2013 FINAL    URINE RAPID DRUG SCREEN (HOSP PERFORMED)     Status: Abnormal   Collection Time    06/10/13  1:56 PM      Result Value Range   Opiates NONE DETECTED  NONE DETECTED   Cocaine NONE DETECTED  NONE DETECTED   Benzodiazepines POSITIVE (*) NONE DETECTED   Amphetamines NONE DETECTED  NONE DETECTED   Tetrahydrocannabinol NONE DETECTED  NONE DETECTED   Barbiturates NONE DETECTED  NONE DETECTED   Comment:            DRUG SCREEN FOR MEDICAL PURPOSES     ONLY.  IF CONFIRMATION IS NEEDED     FOR ANY PURPOSE, NOTIFY LAB     WITHIN 5 DAYS.                LOWEST DETECTABLE LIMITS     FOR URINE DRUG SCREEN     Drug Class       Cutoff (ng/mL)     Amphetamine      1000     Barbiturate      200      Benzodiazepine   762     Tricyclics       831     Opiates          300     Cocaine          300     THC              50  GLUCOSE, CAPILLARY     Status: Abnormal   Collection Time    06/10/13  5:49 PM      Result Value Range   Glucose-Capillary 105 (*) 70 - 99 mg/dL  PHOSPHORUS     Status: None   Collection Time    06/10/13  8:10 PM      Result Value Range   Phosphorus 3.6  2.3 - 4.6 mg/dL  MAGNESIUM     Status: Abnormal   Collection Time    06/10/13  8:10 PM      Result Value Range   Magnesium 1.4 (*) 1.5 - 2.5 mg/dL  TSH     Status: None   Collection Time    06/10/13  8:10 PM      Result Value Range   TSH 2.297  0.350 - 4.500 uIU/mL   Comment: Performed at [facility]  TROPONIN I     Status: Abnormal   Collection Time    06/10/13  8:10 PM      Result Value Range   Troponin I 0.33 (*) <0.30 ng/mL   Comment:            Due to the release kinetics of cTnI,     a negative result within the first hours     of the onset of symptoms does not rule out     myocardial infarction with certainty.     If myocardial infarction is still suspected,     repeat the test at appropriate intervals.     CRITICAL RESULT CALLED TO, READ BACK BY AND VERIFIED WITH:     A EISMA,RN [id number] 01?05/15 WBOND  HEMOGLOBIN A1C     Status: Abnormal   Collection Time    06/10/13  8:10 PM      Result Value Range   Hemoglobin A1C 7.5 (*) <5.7 %   Comment: (NOTE)                                                                               According to the ADA Clinical Practice Recommendations for 2011, when     HbA1c is used as a screening test:      >=6.5%   Diagnostic of Diabetes Mellitus               (if abnormal result is confirmed)     5.7-6.4%   Increased risk of developing Diabetes Mellitus     References:Diagnosis and Classification of Diabetes Mellitus,Diabetes     ZOXW,9604,54(UJWJX 1):S62-S69 and Standards of Medical Care in             Diabetes - 2011,Diabetes Care,2011,34 (Suppl  1):S11-S61.   Mean Plasma Glucose 169 (*) <117 mg/dL   Comment: Performed at [place], CAPILLARY     Status: Abnormal   Collection Time    06/10/13  8:59 PM      Result Value Range   Glucose-Capillary 248 (*) 70 - 99 mg/dL  TROPONIN I     Status: Abnormal   Collection Time    06/10/13 10:15 PM      Result Value Range   Troponin I 0.37 (*) <0.30 ng/mL   Comment:            Due to the release kinetics of  cTnI,     a negative result within the first hours     of the onset of symptoms does not rule out     myocardial infarction with certainty.     If myocardial infarction is still suspected,     repeat the test at appropriate intervals.     CRITICAL VALUE NOTED.  VALUE IS CONSISTENT WITH PREVIOUSLY REPORTED AND CALLED VALUE.  GLUCOSE, CAPILLARY     Status: Abnormal   Collection Time    06/11/13 12:30 AM      Result Value Range   Glucose-Capillary 230 (*) 70 - 99 mg/dL  TROPONIN I     Status: None   Collection Time    06/11/13  4:30 AM      Result Value Range   Troponin I <0.30  <0.30 ng/mL   Comment:            Due to the release kinetics of cTnI,     a negative result within the first hours     of the onset of symptoms does not rule out     myocardial infarction with certainty.     If myocardial infarction is still suspected,     repeat the test at appropriate intervals.  BASIC METABOLIC PANEL     Status: Abnormal   Collection Time    06/11/13  4:30 AM      Result Value Range   Sodium 139  137 - 147 mEq/L   Potassium 4.0  3.7 - 5.3 mEq/L   Chloride 102  96 - 112 mEq/L   CO2 26  19 - 32 mEq/L   Glucose, Bld 240 (*) 70 - 99 mg/dL   BUN 20  6 - 23 mg/dL   Creatinine, Ser 1.10  0.50 - 1.10 mg/dL   Calcium 8.6  8.4 - 10.5 mg/dL   GFR calc non Af Amer 49 (*) >90 mL/min   GFR calc Af Amer 57 (*) >90 mL/min   Comment: (NOTE)     The eGFR has been calculated using the CKD EPI equation.     This calculation has not been validated in all clinical  situations.     eGFR's persistently <90 mL/min signify possible Chronic Kidney     Disease.  CBC     Status: Abnormal   Collection Time    06/11/13  4:30 AM      Result Value Range   WBC 5.7  4.0 - 10.5 K/uL   RBC 3.35 (*) 3.87 - 5.11 MIL/uL   Hemoglobin 10.7 (*) 12.0 - 15.0 g/dL   Comment: DELTA CHECK NOTED     REPEATED TO VERIFY   HCT 30.7 (*) 36.0 - 46.0 %   MCV 91.6  78.0 - 100.0 fL   MCH 31.9  26.0 - 34.0 pg   MCHC 34.9  30.0 - 36.0 g/dL   RDW 12.6  11.5 - 15.5 %   Platelets 124 (*) 150 - 400 K/uL   Comment: DELTA CHECK NOTED     REPEATED TO VERIFY  GLUCOSE, CAPILLARY     Status: Abnormal   Collection Time    06/11/13  7:20 AM      Result Value Range   Glucose-Capillary 215 (*) 70 - 99 mg/dL  GLUCOSE, CAPILLARY     Status: Abnormal   Collection Time    06/11/13 11:19 AM      Result Value Range   Glucose-Capillary 293 (*) 70 - 99 mg/dL    Ct  Head Wo Contrast  06/10/2013   CLINICAL DATA:  Altered mental status.  EXAM: CT HEAD WITHOUT CONTRAST  TECHNIQUE: Contiguous axial images were obtained from the base of the skull through the vertex without intravenous contrast.  COMPARISON:  06/11/2011.  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 On the scout images, there is a wire projecting over the skull which is not visualized on the axial images. Mastoid air cells and paranasal sinuses are within normal limits. Intracranial atherosclerosis. No mass lesion, mass effect, midline shift, hydrocephalus, hemorrhage. No acute territorial cortical ischemia/infarct. Atrophy and chronic ischemic white matter disease is present.  IMPRESSION: Atrophy and chronic ischemic white matter disease without acute intracranial abnormality.   Electronically Signed   By: Dereck Ligas M.D.   On: 06/10/2013 13:16   Dg Chest Portable 1 View  06/10/2013   CLINICAL DATA:  Post arrest.  Diabetic hypertensive patient.  EXAM: PORTABLE CHEST - 1 VIEW  COMPARISON:  03/21/2011 chest x-ray.  FINDINGS: Post CABG.  Heart  size top-normal.  Pulmonary vascular prominence most notable centrally without frank pulmonary edema.  No segmental infiltrate or gross pneumothorax.  Remote right-sided rib fractures.  IMPRESSION: Post CABG. Heart size top-normal.  Pulmonary vascular prominence most notable centrally without frank pulmonary edema.  Remote right-sided rib fractures.   Electronically Signed   By: Chauncey Cruel M.D.   On: 06/10/2013 13:53    Assessment/Plan: Syncope versus seizure. Details of the event seem to be more consistent with a syncopal episode. She indicated that she had an isolated seizure after brain surgery and said that she took keppra for a whole year but it was discontinued because didn't have further seizures. Will get EEG (already ordered) and then decide about restarting low dose keppra. Will follow up    Dorian Pod, MD 06/11/2013, 2:47 PM

## 2013-06-12 ENCOUNTER — Encounter (HOSPITAL_COMMUNITY): Payer: Self-pay | Admitting: Physician Assistant

## 2013-06-12 DIAGNOSIS — E1165 Type 2 diabetes mellitus with hyperglycemia: Secondary | ICD-10-CM

## 2013-06-12 DIAGNOSIS — I059 Rheumatic mitral valve disease, unspecified: Secondary | ICD-10-CM

## 2013-06-12 DIAGNOSIS — I447 Left bundle-branch block, unspecified: Secondary | ICD-10-CM | POA: Diagnosis present

## 2013-06-12 DIAGNOSIS — R778 Other specified abnormalities of plasma proteins: Secondary | ICD-10-CM | POA: Diagnosis present

## 2013-06-12 DIAGNOSIS — IMO0002 Reserved for concepts with insufficient information to code with codable children: Secondary | ICD-10-CM | POA: Diagnosis present

## 2013-06-12 DIAGNOSIS — N289 Disorder of kidney and ureter, unspecified: Secondary | ICD-10-CM | POA: Diagnosis present

## 2013-06-12 DIAGNOSIS — E1129 Type 2 diabetes mellitus with other diabetic kidney complication: Secondary | ICD-10-CM | POA: Diagnosis present

## 2013-06-12 DIAGNOSIS — R7989 Other specified abnormal findings of blood chemistry: Secondary | ICD-10-CM

## 2013-06-12 DIAGNOSIS — E119 Type 2 diabetes mellitus without complications: Secondary | ICD-10-CM

## 2013-06-12 DIAGNOSIS — R9431 Abnormal electrocardiogram [ECG] [EKG]: Secondary | ICD-10-CM

## 2013-06-12 LAB — CBC
HEMATOCRIT: 32.1 % — AB (ref 36.0–46.0)
Hemoglobin: 11.2 g/dL — ABNORMAL LOW (ref 12.0–15.0)
MCH: 31.8 pg (ref 26.0–34.0)
MCHC: 34.9 g/dL (ref 30.0–36.0)
MCV: 91.2 fL (ref 78.0–100.0)
PLATELETS: 100 10*3/uL — AB (ref 150–400)
RBC: 3.52 MIL/uL — ABNORMAL LOW (ref 3.87–5.11)
RDW: 12.5 % (ref 11.5–15.5)
WBC: 4.4 10*3/uL (ref 4.0–10.5)

## 2013-06-12 LAB — MAGNESIUM: Magnesium: 1.3 mg/dL — ABNORMAL LOW (ref 1.5–2.5)

## 2013-06-12 LAB — GLUCOSE, CAPILLARY
GLUCOSE-CAPILLARY: 273 mg/dL — AB (ref 70–99)
GLUCOSE-CAPILLARY: 317 mg/dL — AB (ref 70–99)
Glucose-Capillary: 273 mg/dL — ABNORMAL HIGH (ref 70–99)
Glucose-Capillary: 299 mg/dL — ABNORMAL HIGH (ref 70–99)

## 2013-06-12 MED ORDER — CEFUROXIME AXETIL 500 MG PO TABS
500.0000 mg | ORAL_TABLET | Freq: Two times a day (BID) | ORAL | Status: DC
  Administered 2013-06-13 – 2013-06-14 (×3): 500 mg via ORAL
  Filled 2013-06-12 (×5): qty 1

## 2013-06-12 MED ORDER — ATORVASTATIN CALCIUM 20 MG PO TABS
20.0000 mg | ORAL_TABLET | Freq: Every day | ORAL | Status: DC
  Administered 2013-06-12 – 2013-06-17 (×6): 20 mg via ORAL
  Filled 2013-06-12 (×6): qty 1

## 2013-06-12 MED ORDER — PERFLUTREN LIPID MICROSPHERE
1.0000 mL | INTRAVENOUS | Status: AC | PRN
  Administered 2013-06-12: 2 mL via INTRAVENOUS
  Filled 2013-06-12: qty 10

## 2013-06-12 MED ORDER — INSULIN ASPART PROT & ASPART (70-30 MIX) 100 UNIT/ML ~~LOC~~ SUSP
26.0000 [IU] | Freq: Two times a day (BID) | SUBCUTANEOUS | Status: DC
  Administered 2013-06-12 – 2013-06-13 (×2): 26 [IU] via SUBCUTANEOUS
  Filled 2013-06-12: qty 10

## 2013-06-12 MED ORDER — ACETAMINOPHEN 325 MG PO TABS
650.0000 mg | ORAL_TABLET | Freq: Four times a day (QID) | ORAL | Status: DC | PRN
  Administered 2013-06-12 – 2013-06-14 (×5): 650 mg via ORAL
  Filled 2013-06-12 (×5): qty 2

## 2013-06-12 MED ORDER — SERTRALINE HCL 100 MG PO TABS
100.0000 mg | ORAL_TABLET | Freq: Every day | ORAL | Status: DC
  Administered 2013-06-12 – 2013-06-17 (×6): 100 mg via ORAL
  Filled 2013-06-12 (×6): qty 1

## 2013-06-12 MED ORDER — MAGNESIUM SULFATE 40 MG/ML IJ SOLN
2.0000 g | Freq: Once | INTRAMUSCULAR | Status: AC
  Administered 2013-06-12: 2 g via INTRAVENOUS
  Filled 2013-06-12: qty 50

## 2013-06-12 MED ORDER — MAGNESIUM SULFATE 40 MG/ML IJ SOLN
2.0000 g | Freq: Once | INTRAMUSCULAR | Status: AC
  Filled 2013-06-12: qty 50

## 2013-06-12 NOTE — Progress Notes (Signed)
Echocardiogram 2D Echocardiogram with Definity has been performed.  Alexandra [name] 06/12/2013, 11:24 AM

## 2013-06-12 NOTE — Progress Notes (Signed)
[facility] PROGRESS NOTE  Alexandra [name] DJS:[id number] DOB: [birth year] DOA: 06/10/2013 PCP: Stephens Shire, MD  Assessment/Plan: 1. Syncope vs seizure- await EEG 2. Prolonged QTc interval/LBBB/CAD: echo results below. Stress test tomorrow 3. Hypomagnesemia: replete 4. UTI: cx neg. Change to PO abx 5. Hypertension.  6. DM: resume outpt meds 7. Depression: resume zoloft 8. DVT Prophylaxis- Lovenox  Code Status: *Full code Family Communication: *none atbedside Disposition Plan: Home when stable   Consultants:  Neurology   cardiology   HPI/Subjective: No complaints  Objective: Filed Vitals:   06/12/13 1410  BP: 145/57  Pulse: 76  Temp: 99.4 F (37.4 C)  Resp: 18    Intake/Output Summary (Last 24 hours) at 06/12/13 1748 Last data filed at 06/11/13 1900  Gross per 24 hour  Intake    900 ml  Output      0 ml  Net    900 ml   Filed Weights   06/12/13 1300  Weight: 84.324 kg (185 lb 14.4 oz)    Exam:   Lungs: CTA without WRR  Heart: RRRR without MGR Abdomen: BS normoactive. Soft, Nondistended, non-tender.  Extremities: No pretibial edema, no erythema Neuro: nonfocal  Data Reviewed: Echo Left ventricle: Septal and apical hypokinesis The cavity size was mildly dilated. Wall thickness was increased in a pattern of moderate LVH. Systolic function was normal. The estimated ejection fraction was in the range of 50% to 55%. - Mitral valve: Calcified annulus. Mild regurgitation. - Left atrium: The atrium was mildly dilated. - Atrial septum: No defect or patent foramen ovale was identified.  Basic Metabolic Panel:  Recent Labs Lab 06/10/13 1235 06/10/13 2010 06/11/13 0430 06/12/13 1021  NA 141  --  139  --   K 4.1  --  4.0  --   CL 104  --  102  --   CO2  --   --  26  --   GLUCOSE 118*  --  240*  --   BUN 28*  --  20  --   CREATININE 1.50*  --  1.10  --   CALCIUM  --   --  8.6  --   MG  --  1.4*  --  1.3*  PHOS  --  3.6  --   --     Liver Function Tests: No results found for this basename: AST, ALT, ALKPHOS, BILITOT, PROT, ALBUMIN,  in the last 168 hours No results found for this basename: LIPASE, AMYLASE,  in the last 168 hours No results found for this basename: AMMONIA,  in the last 168 hours CBC:  Recent Labs Lab 06/10/13 1153 06/10/13 1235 06/11/13 0430 06/12/13 1021  WBC 14.9*  --  5.7 4.4  NEUTROABS 7.7  --   --   --   HGB 12.8 13.6 10.7* 11.2*  HCT 37.3 40.0 30.7* 32.1*  MCV 94.9  --  91.6 91.2  PLT 198  --  124* 100*   Cardiac Enzymes:  Recent Labs Lab 06/10/13 2010 06/10/13 2215 06/11/13 0430  TROPONINI 0.33* 0.37* <0.30   BNP (last 3 results) No results found for this basename: PROBNP,  in the last 8760 hours CBG:  Recent Labs Lab 06/11/13 1713 06/11/13 2032 06/12/13 0730 06/12/13 1157 06/12/13 1628  GLUCAP 314* 252* 273* 299* 273*    Recent Results (from the past 240 hour(s))  URINE CULTURE     Status: None   Collection Time    06/10/13  1:56 PM      Result  Value Range Status   Specimen Description URINE, CLEAN CATCH   Final   Special Requests NONE   Final   Culture  Setup Time     Final   Value: 06/10/2013 15:57     Performed at [place]     Final   Value: 50,000 COLONIES/ML     Performed at [facility]   Culture     Final   Value: Multiple bacterial morphotypes present, none predominant. Suggest appropriate recollection if clinically indicated.     Performed at [facility]   Report Status 06/11/2013 FINAL   Final     Studies: No results found.  Scheduled Meds: . amLODipine  5 mg Oral Daily  . aspirin EC  81 mg Oral Daily  . atorvastatin  10 mg Oral q1800  . cefTRIAXone (ROCEPHIN)  IV  1 g Intravenous Q24H  . enoxaparin (LOVENOX) injection  30 mg Subcutaneous Q24H  . insulin aspart  0-9 Units Subcutaneous TID WC  . insulin aspart protamine- aspart  26 Units Subcutaneous BID WC  . nebivolol  20 mg Oral BID  .  pantoprazole  40 mg Oral Daily  . sertraline  100 mg Oral Daily  . sodium chloride  3 mL Intravenous Q12H   Continuous Infusions:    Time spent: 25 min  Abbey Veith L  [facility] Pager [phone]. If 7PM-7AM, please contact night-coverage at [email or web address], password [email or web address] 06/12/2013, 5:48 PM  LOS: 2 days

## 2013-06-12 NOTE — Clinical Documentation Improvement (Signed)
  Per ED Note "yelling, combative and required both chemical and physical restraint" given Haldol and Versed. ED Note also states "confused, agitated, belligerent with episodes in between of decreased responsiveness". In the Coding world terms "altered mental status" and "change in mental status" are nonspecific and low weighted. Please clarify "altered mental status" to illustrate severity of illness and risk of mortality. Thank you.  Possible Clinical Conditions?  - Encephalopathy resolved      - Hyponatremia / Hypernatremia - Poisoning / Overdose - Other Condition  Thank You, Ezekiel Ina ,RN Clinical Documentation Specialist:  [phone]  [facility] Information Management

## 2013-06-12 NOTE — Progress Notes (Signed)
NEURO HOSPITALIST PROGRESS NOTE   SUBJECTIVE:                                                                                                                         Patient awake oriented and following all commands. Describes chest discomfort from CPR.  No other complaints.   OBJECTIVE:                                                                                                                           Vital signs in last 24 hours: Temp:  [97.2 F (36.2 C)-98.6 F (37 C)] 98.6 F (37 C) (01/07 0543) Pulse Rate:  [77-83] 79 (01/07 0543) Resp:  [16-18] 18 (01/07 0543) BP: (128-149)/(48-63) 149/57 mmHg (01/07 0543) SpO2:  [93 %-97 %] 95 % (01/07 0543)  Intake/Output from previous day: 01/06 0701 - 01/07 0700 In: 9381 [P.O.:240; I.V.:900; IV Piggyback:50] Out: -  Intake/Output this shift:   Nutritional status: Carb Control  Past Medical History  Diagnosis Date  . HTN (hypertension)   . CAD (coronary artery disease)     Remote CABG in 2008  . DM (diabetes mellitus)   . HTN (hypertension)   . Atrial fibrillation   . GERD (gastroesophageal reflux disease)   . Venous insufficiency   . Left bundle branch block   . Obesity   . Chronic anticoagulation   . Breast cancer      Neurologic Exam:  Mental Status: Alert, oriented, thought content appropriate.  Speech fluent without evidence of aphasia.  Able to follow 3 step commands without difficulty. Cranial Nerves: II: Visual fields grossly normal, pupils equal, round, reactive to light and accommodation III,IV, VI: ptosis not present, extra-ocular motions intact bilaterally V,VII: smile symmetric, facial light touch sensation normal bilaterally VIII: hearing normal bilaterally IX,X: gag reflex present XI: bilateral shoulder shrug XII: midline tongue extension without atrophy or fasciculations  Motor: Right : Upper extremity   5/5    Left:     Upper extremity   5/5  Lower  extremity   5/5     Lower extremity   5/5 Tone and bulk:normal tone throughout; no atrophy noted Sensory: Pinprick and light touch intact throughout, bilaterally Deep Tendon Reflexes:  1+ Plantars:  Right: downgoing   Left: downgoing Cerebellar: normal finger-to-nose,  normal heel-to-shin test CV: pulses palpable throughout    Lab Results: Lab Results  Component Value Date/Time   CHOL 127 11/02/2011  9:20 AM   Lipid Panel No results found for this basename: CHOL, TRIG, HDL, CHOLHDL, VLDL, LDLCALC,  in the last 72 hours  Studies/Results: Ct Head Wo Contrast  06/10/2013   CLINICAL DATA:  Altered mental status.  EXAM: CT HEAD WITHOUT CONTRAST  TECHNIQUE: Contiguous axial images were obtained from the base of the skull through the vertex without intravenous contrast.  COMPARISON:  06/11/2011.  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 On the scout images, there is a wire projecting over the skull which is not visualized on the axial images. Mastoid air cells and paranasal sinuses are within normal limits. Intracranial atherosclerosis. No mass lesion, mass effect, midline shift, hydrocephalus, hemorrhage. No acute territorial cortical ischemia/infarct. Atrophy and chronic ischemic white matter disease is present.  IMPRESSION: Atrophy and chronic ischemic white matter disease without acute intracranial abnormality.   Electronically Signed   By: Dereck Ligas M.D.   On: 06/10/2013 13:16   Dg Chest Portable 1 View  06/10/2013   CLINICAL DATA:  Post arrest.  Diabetic hypertensive patient.  EXAM: PORTABLE CHEST - 1 VIEW  COMPARISON:  03/21/2011 chest x-ray.  FINDINGS: Post CABG.  Heart size top-normal.  Pulmonary vascular prominence most notable centrally without frank pulmonary edema.  No segmental infiltrate or gross pneumothorax.  Remote right-sided rib fractures.  IMPRESSION: Post CABG. Heart size top-normal.  Pulmonary vascular prominence most notable centrally without frank pulmonary edema.  Remote  right-sided rib fractures.   Electronically Signed   By: Chauncey Cruel M.D.   On: 06/10/2013 13:53    MEDICATIONS                                                                                                                        Scheduled: . amLODipine  5 mg Oral Daily  . aspirin EC  81 mg Oral Daily  . atorvastatin  10 mg Oral q1800  . cefTRIAXone (ROCEPHIN)  IV  1 g Intravenous Q24H  . enoxaparin (LOVENOX) injection  30 mg Subcutaneous Q24H  . insulin aspart  0-9 Units Subcutaneous TID WC  . nebivolol  20 mg Oral BID  . pantoprazole  40 mg Oral Daily  . sodium chloride  3 mL Intravenous Q12H    ASSESSMENT/PLAN:  Syncope versus seizure. Details of the event seem to be more consistent with a syncopal episode.  She is back to baseline at this time.  Patient has had no further events.  EEG is pending.  If EEG shows no focal irritability would hold off on initiating AED.   Will continue to follow EEG results.    Assessment and plan discussed with with attending physician and they are in agreement.    Etta Quill PA-C [facility] [phone]  06/12/2013, 9:45 AM

## 2013-06-12 NOTE — Progress Notes (Signed)
Patient: Alexandra [name] / Admit Date: 06/10/2013 / Date of Encounter: 06/12/2013, 9:32 AM   Subjective  Denies acute complaints. "I can't believe they said I acted like that, I swear I'm not a violent person!" She does endorse episodic low grade chest discomfort and dyspnea on exertion that is unchanged for several years. No other episodes of syncope besides seizure 3 yrs ago. Denies any recent dizziness or palpitations.  EEG results and 2D echo pending.  Objective   Telemetry: NSR LBBB a lot of artifact but no significant arrhythmias, sinus brady HR upper 48-49 overnight transiently   Physical Exam: Blood pressure 149/57, pulse 79, temperature 98.6 F (37 C), temperature source Oral, resp. rate 18, SpO2 95.00%. General: Well developed, well nourished, in no acute distress. Head: Normocephalic, atraumatic, sclera non-icteric, no xanthomas, nares are without discharge. Neck: Negative for carotid bruits. JVP not elevated. Lungs: Clear bilaterally to auscultation without wheezes, rales, or rhonchi. Breathing is unlabored. Heart: RRR S1 S2 without murmurs, rubs, or gallops.  Abdomen: Soft, non-tender, non-distended with normoactive bowel sounds. No rebound/guarding. Extremities: No clubbing or cyanosis. Tr pedal edema. No pedal pulses are 2+ and equal bilaterally. Neuro: Alert and oriented X 3. Moves all extremities spontaneously. Psych:  Responds to questions appropriately with a normal affect.   Intake/Output Summary (Last 24 hours) at 06/12/13 0932 Last data filed at 06/11/13 1900  Gross per 24 hour  Intake    950 ml  Output      0 ml  Net    950 ml    Inpatient Medications:  . amLODipine  5 mg Oral Daily  . aspirin EC  81 mg Oral Daily  . atorvastatin  10 mg Oral q1800  . cefTRIAXone (ROCEPHIN)  IV  1 g Intravenous Q24H  . enoxaparin (LOVENOX) injection  30 mg Subcutaneous Q24H  . insulin aspart  0-9 Units Subcutaneous TID WC  . nebivolol  20 mg Oral BID  . pantoprazole  40 mg  Oral Daily  . sodium chloride  3 mL Intravenous Q12H   Infusions:  . sodium chloride 75 mL/hr at 06/12/13 0250    Labs:  Recent Labs  06/10/13 1235 06/10/13 2010 06/11/13 0430  NA 141  --  139  K 4.1  --  4.0  CL 104  --  102  CO2  --   --  26  GLUCOSE 118*  --  240*  BUN 28*  --  20  CREATININE 1.50*  --  1.10  CALCIUM  --   --  8.6  MG  --  1.4*  --   PHOS  --  3.6  --     Recent Labs  06/10/13 1153 06/10/13 1235 06/11/13 0430  WBC 14.9*  --  5.7  NEUTROABS 7.7  --   --   HGB 12.8 13.6 10.7*  HCT 37.3 40.0 30.7*  MCV 94.9  --  91.6  PLT 198  --  124*    Recent Labs  06/10/13 2010 06/10/13 2215 06/11/13 0430  TROPONINI 0.33* 0.37* <0.30    Recent Labs  06/10/13 2010  HGBA1C 7.5*     Radiology/Studies:  Ct Head Wo Contrast  06/10/2013   CLINICAL DATA:  Altered mental status.  EXAM: CT HEAD WITHOUT CONTRAST  TECHNIQUE: Contiguous axial images were obtained from the base of the skull through the vertex without intravenous contrast.  COMPARISON:  06/11/2011.  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 On the scout images, there is a wire projecting over the skull  which is not visualized on the axial images. Mastoid air cells and paranasal sinuses are within normal limits. Intracranial atherosclerosis. No mass lesion, mass effect, midline shift, hydrocephalus, hemorrhage. No acute territorial cortical ischemia/infarct. Atrophy and chronic ischemic white matter disease is present.  IMPRESSION: Atrophy and chronic ischemic white matter disease without acute intracranial abnormality.   Electronically Signed   By: Dereck Ligas M.D.   On: 06/10/2013 13:16   Dg Chest Portable 1 View  06/10/2013   CLINICAL DATA:  Post arrest.  Diabetic hypertensive patient.  EXAM: PORTABLE CHEST - 1 VIEW  COMPARISON:  03/21/2011 chest x-ray.  FINDINGS: Post CABG.  Heart size top-normal.  Pulmonary vascular prominence most notable centrally without frank pulmonary edema.  No segmental  infiltrate or gross pneumothorax.  Remote right-sided rib fractures.  IMPRESSION: Post CABG. Heart size top-normal.  Pulmonary vascular prominence most notable centrally without frank pulmonary edema.  Remote right-sided rib fractures.   Electronically Signed   By: Chauncey Cruel M.D.   On: 06/10/2013 13:53     Assessment and Plan  1. Syncope (1 min CPR for unpalpable pulse, patient came to spontaneously but agitated afterwards) vs seizure 2. Delirium in ED, etiology unclear 3. Prolonged QTc interval in setting of known LBBB 4. Hypomagnesemia 5. PAF, not on Coumadin due to history of falls and SDH 6. HTN 7. CAD s/p CABG 2008 with mildly elevated troponin, unknown LV function 8. Diabetes mellitus 9. Acute renal insufficiency, resolved  Appreciate neuro input. BP's borderline on admission (80-90 in ED) but have since recovered. Orthostatics negative yesterday. Will discuss ischemic workup given question of possible stress test and further QT evaluation with MD. Repeat magnesium level today and replete if continues to be low. 2D echo pending. Home Zoloft is on hold by IM; this medicine does not appear to have prolonging QT properties but will defer to primary team regarding timing of resumption so as not to elicit withdrawal.  Signed, Dayna Dunn PA-C As above; patient seen and examined; no dyspnea or chest pain. Etiology of events unclear (? Syncope vs sz); Patient apparently delerious after in ER; EEG pending; ECG shows sinus with LBBB and prolonged QT (QTC 516); Mg 1.4. Will supplement and repeat ECG. Mildly elevated troponin but in setting of brief CPR; plan echo to assess LV function and nuclear study to R/O ischemia. Follow on telemetry. If above negative, will need outpatient event monitor. Kirk Ruths

## 2013-06-12 NOTE — Progress Notes (Deleted)
  Echocardiogram 2D Echocardiogram has been performed.  Alexandra [name] 06/12/2013, 11:23 AM

## 2013-06-12 NOTE — Progress Notes (Signed)
Inpatient Diabetes Program Recommendations  AACE/ADA: New Consensus Statement on Inpatient Glycemic Control (2013)  Target Ranges:  Prepandial:   less than 140 mg/dL      Peak postprandial:   less than 180 mg/dL (1-2 hours)      Critically ill patients:  140 - 180 mg/dL     Results for Alexandra [name], Alexandra [name] (MRN [id number]) as of 06/12/2013 10:14  Ref. Range 06/11/2013 07:20 06/11/2013 11:19 06/11/2013 17:13 06/11/2013 20:32  Glucose-Capillary Latest Range: 70-99 mg/dL 215 (H) 293 (H) 314 (H) 252 (H)    Results for Alexandra [name], Alexandra [name] (MRN [id number]) as of 06/12/2013 10:14  Ref. Range 06/12/2013 07:30  Glucose-Capillary Latest Range: 70-99 mg/dL 273 (H)    **Patient with history of DM per Dr. Doyle Askew' H&P note from 06/10/13.  Not taking any medications at home for DM.  A1c shows decent control at home- A1c 7.5% (06/10/13).   **MD- Unsure why CBGs are so elevated.  MD, Please consider adding basal insulin while patient in hospital- Levemir 15 units QHS   Will follow. Wyn Quaker RN, MSN, CDE Diabetes Coordinator Inpatient Diabetes Program Team Pager: [phone] (8a-10p)

## 2013-06-12 NOTE — Clinical Documentation Improvement (Signed)
  Per ED MD and H&P patient with syncope, no pulse and 1 minute of CPR. Per cardiology consult Note:"NSTEMI".  Please clarify in Notes and DC summary all diagnoses suspected/probable/possible for this patient. Thank you.  Possible Clinical Conditions? - Cardiac arrest   - NSTEMI - Possible ventricular tachycardia  - Other Condition (please specify)   Thank You, Alexandra [name] ,RN Clinical Documentation Specialist:  [phone]  [place] Information Management

## 2013-06-13 ENCOUNTER — Inpatient Hospital Stay (HOSPITAL_COMMUNITY): Payer: PRIVATE HEALTH INSURANCE

## 2013-06-13 DIAGNOSIS — I469 Cardiac arrest, cause unspecified: Secondary | ICD-10-CM

## 2013-06-13 DIAGNOSIS — R079 Chest pain, unspecified: Secondary | ICD-10-CM

## 2013-06-13 LAB — BASIC METABOLIC PANEL
BUN: 16 mg/dL (ref 6–23)
CO2: 25 meq/L (ref 19–32)
CREATININE: 1.08 mg/dL (ref 0.50–1.10)
Calcium: 9.2 mg/dL (ref 8.4–10.5)
Chloride: 99 mEq/L (ref 96–112)
GFR calc Af Amer: 58 mL/min — ABNORMAL LOW (ref >90)
GFR calc non Af Amer: 50 mL/min — ABNORMAL LOW (ref >90)
Glucose, Bld: 381 mg/dL — ABNORMAL HIGH (ref 70–99)
Potassium: 4 mEq/L (ref 3.7–5.3)
Sodium: 139 mEq/L (ref 137–147)

## 2013-06-13 LAB — GLUCOSE, CAPILLARY
GLUCOSE-CAPILLARY: 348 mg/dL — AB (ref 70–99)
Glucose-Capillary: 308 mg/dL — ABNORMAL HIGH (ref 70–99)
Glucose-Capillary: 316 mg/dL — ABNORMAL HIGH (ref 70–99)
Glucose-Capillary: 320 mg/dL — ABNORMAL HIGH (ref 70–99)
Glucose-Capillary: 368 mg/dL — ABNORMAL HIGH (ref 70–99)
Glucose-Capillary: 469 mg/dL — ABNORMAL HIGH (ref 70–99)

## 2013-06-13 LAB — MAGNESIUM: MAGNESIUM: 1.7 mg/dL (ref 1.5–2.5)

## 2013-06-13 MED ORDER — INSULIN DETEMIR 100 UNIT/ML ~~LOC~~ SOLN: 15.0000 [IU] | Freq: Once | SUBCUTANEOUS | Status: DC

## 2013-06-13 MED ORDER — ASPIRIN 81 MG PO CHEW
81.0000 mg | CHEWABLE_TABLET | ORAL | Status: AC
  Administered 2013-06-14: 81 mg via ORAL
  Filled 2013-06-13: qty 1

## 2013-06-13 MED ORDER — TECHNETIUM TC 99M SESTAMIBI GENERIC - CARDIOLITE
30.0000 | Freq: Once | INTRAVENOUS | Status: AC | PRN
  Administered 2013-06-13: 30 via INTRAVENOUS

## 2013-06-13 MED ORDER — INSULIN ASPART 100 UNIT/ML ~~LOC~~ SOLN
0.0000 [IU] | Freq: Three times a day (TID) | SUBCUTANEOUS | Status: DC
  Administered 2013-06-14: 4 [IU] via SUBCUTANEOUS
  Administered 2013-06-14: 7 [IU] via SUBCUTANEOUS

## 2013-06-13 MED ORDER — INSULIN ASPART 100 UNIT/ML ~~LOC~~ SOLN: 0.0000 [IU] | Freq: Three times a day (TID) | SUBCUTANEOUS | Status: DC

## 2013-06-13 MED ORDER — REGADENOSON 0.4 MG/5ML IV SOLN
0.4000 mg | Freq: Once | INTRAVENOUS | Status: AC
  Administered 2013-06-13: 0.4 mg via INTRAVENOUS
  Filled 2013-06-13: qty 5

## 2013-06-13 MED ORDER — TECHNETIUM TC 99M SESTAMIBI GENERIC - CARDIOLITE
10.0000 | Freq: Once | INTRAVENOUS | Status: AC | PRN
  Administered 2013-06-13: 10 via INTRAVENOUS

## 2013-06-13 MED ORDER — REGADENOSON 0.4 MG/5ML IV SOLN
INTRAVENOUS | Status: AC
  Administered 2013-06-13: 0.4 mg via INTRAVENOUS
  Filled 2013-06-13: qty 5

## 2013-06-13 MED ORDER — SODIUM CHLORIDE 0.9 % IV SOLN
1.0000 mL/kg/h | INTRAVENOUS | Status: DC
  Administered 2013-06-14: 1 mL/kg/h via INTRAVENOUS

## 2013-06-13 MED ORDER — MAGNESIUM OXIDE 400 (241.3 MG) MG PO TABS
400.0000 mg | ORAL_TABLET | Freq: Every day | ORAL | Status: DC
Start: 2013-06-13 — End: 2013-06-17
  Administered 2013-06-13 – 2013-06-17 (×5): 400 mg via ORAL
  Filled 2013-06-13 (×5): qty 1

## 2013-06-13 MED ORDER — INSULIN ASPART 100 UNIT/ML ~~LOC~~ SOLN
0.0000 [IU] | Freq: Every day | SUBCUTANEOUS | Status: DC
  Administered 2013-06-13: 5 [IU] via SUBCUTANEOUS

## 2013-06-13 MED ORDER — INSULIN ASPART PROT & ASPART (70-30 MIX) 100 UNIT/ML ~~LOC~~ SUSP
30.0000 [IU] | Freq: Two times a day (BID) | SUBCUTANEOUS | Status: DC
  Filled 2013-06-13: qty 10

## 2013-06-13 MED ORDER — INSULIN ASPART PROT & ASPART (70-30 MIX) 100 UNIT/ML ~~LOC~~ SUSP
50.0000 [IU] | Freq: Once | SUBCUTANEOUS | Status: AC
  Administered 2013-06-13: 50 [IU] via SUBCUTANEOUS
  Filled 2013-06-13: qty 10

## 2013-06-13 NOTE — Progress Notes (Signed)
Discussed with [name].  [facility] PROGRESS NOTE  Alexandra [name] NLZ:[id number] DOB: [birth year] DOA: 06/10/2013 PCP: Stephens Shire, MD  Assessment/Plan: 1. Syncope: stress test abnormal. For cath in am. EP consulted 2. Prolonged QTc interval/LBBB/CAD:  3. Hypomagnesemia: corrected 4. UTI: cx neg. Change to PO abx 5. Hypertension.  6. DM uncontrolled despite resumption of insulin. Have adjusted. hgb a1C >7 7. Depression: resume zoloft  Appreciate consultants  Code Status: *Full code Family Communication: *none atbedside Disposition Plan: Home when stable   Consultants:  Neurology   cardiology   HPI/Subjective: No complaints  Objective: Filed Vitals:   06/13/13 1335  BP: 157/72  Pulse: 72  Temp: 98.3 F (36.8 C)  Resp: 70    Intake/Output Summary (Last 24 hours) at 06/13/13 1522 Last data filed at 06/13/13 0900  Gross per 24 hour  Intake      0 ml  Output      0 ml  Net      0 ml   Filed Weights   06/12/13 1300  Weight: 84.324 kg (185 lb 14.4 oz)    Exam:   Lungs: CTA without WRR  Heart: RRRR without MGR Abdomen: BS normoactive. Soft, Nondistended, non-tender.  Extremities: No pretibial edema, no erythema Neuro: nonfocal  Data Reviewed: Echo Left ventricle: Septal and apical hypokinesis The cavity size was mildly dilated. Wall thickness was increased in a pattern of moderate LVH. Systolic function was normal. The estimated ejection fraction was in the range of 50% to 55%. - Mitral valve: Calcified annulus. Mild regurgitation. - Left atrium: The atrium was mildly dilated. - Atrial septum: No defect or patent foramen ovale was identified.  Basic Metabolic Panel:  Recent Labs Lab 06/10/13 1235 06/10/13 2010 06/11/13 0430 06/12/13 1021 06/13/13 0532  NA 141  --  139  --  139  K 4.1  --  4.0  --  4.0  CL 104  --  102  --  99  CO2  --   --  26  --  25  GLUCOSE 118*  --  240*  --  381*  BUN 28*  --  20  --  16  CREATININE  1.50*  --  1.10  --  1.08  CALCIUM  --   --  8.6  --  9.2  MG  --  1.4*  --  1.3* 1.7  PHOS  --  3.6  --   --   --    Liver Function Tests: No results found for this basename: AST, ALT, ALKPHOS, BILITOT, PROT, ALBUMIN,  in the last 168 hours No results found for this basename: LIPASE, AMYLASE,  in the last 168 hours No results found for this basename: AMMONIA,  in the last 168 hours CBC:  Recent Labs Lab 06/10/13 1153 06/10/13 1235 06/11/13 0430 06/12/13 1021  WBC 14.9*  --  5.7 4.4  NEUTROABS 7.7  --   --   --   HGB 12.8 13.6 10.7* 11.2*  HCT 37.3 40.0 30.7* 32.1*  MCV 94.9  --  91.6 91.2  PLT 198  --  124* 100*   Cardiac Enzymes:  Recent Labs Lab 06/10/13 2010 06/10/13 2215 06/11/13 0430  TROPONINI 0.33* 0.37* <0.30   BNP (last 3 results) No results found for this basename: PROBNP,  in the last 8760 hours CBG:  Recent Labs Lab 06/12/13 2108 06/13/13 0005 06/13/13 0807 06/13/13 1036 06/13/13 1154  GLUCAP 317* 316* 320* 348* 308*    Recent Results (from the  past 240 hour(s))  URINE CULTURE     Status: None   Collection Time    06/10/13  1:56 PM      Result Value Range Status   Specimen Description URINE, CLEAN CATCH   Final   Special Requests NONE   Final   Culture  Setup Time     Final   Value: 06/10/2013 15:57     Performed at [facility]     Final   Value: 50,000 COLONIES/ML     Performed at [facility]   Culture     Final   Value: Multiple bacterial morphotypes present, none predominant. Suggest appropriate recollection if clinically indicated.     Performed at [facility]   Report Status 06/11/2013 FINAL   Final     Studies: Nm Myocar Multi W/spect W/wall Motion / Ef  06/13/2013   CLINICAL DATA:  Chest pain. History of hypertension and diabetes. Left bundle-branch block. History of breast cancer.  EXAM: MYOCARDIAL IMAGING WITH SPECT (REST AND PHARMACOLOGIC-STRESS)  GATED LEFT VENTRICULAR WALL MOTION  STUDY  LEFT VENTRICULAR EJECTION FRACTION  TECHNIQUE: Standard myocardial SPECT imaging was performed after resting intravenous injection of 10 mCi Tc-72m sestamibi. Subsequently, intravenous infusion of Lexiscan was performed under the supervision of the Cardiology staff. At peak effect of the drug, 30 mCi Tc-80m sestamibi was injected intravenously and standard myocardial SPECT imaging was performed. Quantitative gated imaging was also performed to evaluate left ventricular wall motion, and estimate left ventricular ejection fraction.  COMPARISON:  DG CHEST 1V PORT dated 06/10/2013; CT CHEST W/O CM dated 03/25/2011  FINDINGS: SPECT images demonstrate a large infarct involving the apex and distal anterior and septal walls. There is possible minimal reversibility along the proximal margins of this infarct. The summed difference score is only 4.  Gated images were reviewed and demonstrate marked hypokinesis of the infarcted segments. There is aneurysmal dilatation and paradoxical motion of the distal septum. The QGS ejection fraction calculated at rest is 40% with an end-diastolic volume of 53GD and an end-systolic volume of 92EQ.  IMPRESSION: 1. 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 Associated distal septal paradoxical motion. 2. Mild peri-infarct ischemia along the proximal margins of the infarct cannot be excluded. 3. Left ventricular ejection fraction 40%.   Electronically Signed   By: Camie Patience M.D.   On: 06/13/2013 13:26    Scheduled Meds: . amLODipine  5 mg Oral Daily  . aspirin EC  81 mg Oral Daily  . atorvastatin  20 mg Oral Daily  . cefUROXime  500 mg Oral BID WC  . enoxaparin (LOVENOX) injection  30 mg Subcutaneous Q24H  . insulin aspart protamine- aspart  30 Units Subcutaneous BID WC  . magnesium oxide  400 mg Oral Daily  . nebivolol  20 mg Oral BID  . pantoprazole  40 mg Oral Daily  . sertraline  100 mg Oral Daily  . sodium chloride  3 mL Intravenous Q12H    Continuous Infusions:    Time spent: 25 min  Heaven Meeker L  [facility] Pager [phone]. If 7PM-7AM, please contact night-coverage at [email or web address], password [facility] 06/13/2013, 3:22 PM  LOS: 3 days

## 2013-06-13 NOTE — Progress Notes (Signed)
NEURO HOSPITALIST PROGRESS NOTE   SUBJECTIVE:                                                                                                                        No further episodes.  Patient had a Myoview today pending results.  OBJECTIVE:                                                                                                                           Vital signs in last 24 hours: Temp:  [98.3 F (36.8 C)-99.1 F (37.3 C)] 98.3 F (36.8 C) (01/08 1335) Pulse Rate:  [72-96] 72 (01/08 1335) Resp:  [18-70] 70 (01/08 1335) BP: (134-176)/(52-87) 157/72 mmHg (01/08 1335) SpO2:  [95 %-100 %] 100 % (01/08 1335)  Intake/Output from previous day: 01/07 0701 - 01/08 0700 In: 675  Out: -  Intake/Output this shift:   Nutritional status: Carb Control  Past Medical History  Diagnosis Date  . HTN (hypertension)   . CAD (coronary artery disease)     a. Remote CABG in 2008 in [place].  . DM (diabetes mellitus)   . HTN (hypertension)   . PAF (paroxysmal atrial fibrillation)     a. Not on Coumadin due to history of falls and SDH  . GERD (gastroesophageal reflux disease)   . Venous insufficiency   . Left bundle branch block   . Obesity   . Chronic anticoagulation   . Breast cancer   . Seizure     a. 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     Neurologic Exam:  Mental Status: Alert, oriented, thought content appropriate.  Speech fluent without evidence of aphasia.  Able to follow 3 step commands without difficulty. Cranial Nerves: II: Discs flat bilaterally; Visual fields grossly normal, pupils equal, round, reactive to light and accommodation III,IV, VI: ptosis not present, extra-ocular motions intact bilaterally V,VII: smile symmetric, facial light touch sensation normal bilaterally VIII: hearing normal bilaterally IX,X: gag reflex present XI: bilateral shoulder shrug XII: midline tongue extension without atrophy or  fasciculations  Motor: Right : Upper extremity   5/5    Left:     Upper extremity   5/5  Lower extremity   5/5  Lower extremity   5/5 Tone and bulk:normal tone throughout; no atrophy noted Sensory: Pinprick and light touch intact throughout, bilaterally Deep Tendon Reflexes:  1+ througout  Plantars: Right: downgoing   Left: downgoing Cerebellar: normal finger-to-nose,  normal heel-to-shin test    Lab Results: Lab Results  Component Value Date/Time   CHOL 127 11/02/2011  9:20 AM   Lipid Panel No results found for this basename: CHOL, TRIG, HDL, CHOLHDL, VLDL, LDLCALC,  in the last 72 hours  Studies/Results: Nm Myocar Multi W/spect W/wall Motion / Ef  06/13/2013   CLINICAL DATA:  Chest pain. History of hypertension and diabetes. Left bundle-branch block. History of breast cancer.  EXAM: MYOCARDIAL IMAGING WITH SPECT (REST AND PHARMACOLOGIC-STRESS)  GATED LEFT VENTRICULAR WALL MOTION STUDY  LEFT VENTRICULAR EJECTION FRACTION  TECHNIQUE: Standard myocardial SPECT imaging was performed after resting intravenous injection of 10 mCi Tc-7m sestamibi. Subsequently, intravenous infusion of Lexiscan was performed under the supervision of the Cardiology staff. At peak effect of the drug, 30 mCi Tc-41m sestamibi was injected intravenously and standard myocardial SPECT imaging was performed. Quantitative gated imaging was also performed to evaluate left ventricular wall motion, and estimate left ventricular ejection fraction.  COMPARISON:  DG CHEST 1V PORT dated 06/10/2013; CT CHEST W/O CM dated 03/25/2011  FINDINGS: SPECT images demonstrate a large infarct involving the apex and distal anterior and septal walls. There is possible minimal reversibility along the proximal margins of this infarct. The summed difference score is only 4.  Gated images were reviewed and demonstrate marked hypokinesis of the infarcted segments. There is aneurysmal dilatation and paradoxical motion of the distal septum. The  QGS ejection fraction calculated at rest is 40% with an end-diastolic volume of 13KG and an end-systolic volume of 40NU.  IMPRESSION: 1. 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 Associated distal septal paradoxical motion. 2. Mild peri-infarct ischemia along the proximal margins of the infarct cannot be excluded. 3. Left ventricular ejection fraction 40%.   Electronically Signed   By: Camie Patience M.D.   On: 06/13/2013 13:26   EEG: Interpretation: This is a normal EEG recording during wakefulness and during sleep. No evidence of an epileptic disorder was recorded.    MEDICATIONS                                                                                                                        Scheduled: . amLODipine  5 mg Oral Daily  . aspirin EC  81 mg Oral Daily  . atorvastatin  20 mg Oral Daily  . cefUROXime  500 mg Oral BID WC  . enoxaparin (LOVENOX) injection  30 mg Subcutaneous Q24H  . insulin aspart protamine- aspart  30 Units Subcutaneous BID WC  . magnesium oxide  400 mg Oral Daily  . nebivolol  20 mg Oral BID  . pantoprazole  40 mg Oral Daily  . sertraline  100 mg Oral Daily  . sodium chloride  3 mL Intravenous Q12H  ASSESSMENT/PLAN:                                                                                                            Likely syncopal episode.  EEG was unremarkable. Cardiology work up pending.  Would not initiate AED at this time. Neurology will S/O.    Assessment and plan discussed with with attending physician and they are in agreement.    Etta Quill PA-C [facility] [phone]  06/13/2013, 3:12 PM

## 2013-06-13 NOTE — Progress Notes (Signed)
Discussed nuc results with Dr. Stanford Breed. He recommended cath tomorrow to define anatomy. Wannetta Sender is working on obtaining records from prior cath. EP has also been consulted and Dr. Caryl Comes is considering loop vs defibrillator. Risks and benefits of cardiac catheterization have been discussed with the patient. These include bleeding, infection, kidney damage, stroke, heart attack, death.  The patient understands these risks and is willing to proceed. She did not have any questions. I also spoke to Dr. Conley Canal and requested assistance with insulin adjustment given hyperglycemia but the need for her to be NPO after midnight tomorrow.  Nurse made aware of plan. Rylen Hou PA-C

## 2013-06-13 NOTE — Consult Note (Signed)
ELECTROPHYSIOLOGY CONSULT NOTE  Patient ID: Alexandra [name] MRN: [id number], DOB/AGE: Apr 02, 1941   Admit date: 06/10/2013 Date of Consult: 06/13/2013  Primary Physician: Stephens Shire, MD Primary Cardiologist: Acie Fredrickson, MD Reason for Consultation: Syncope / witnessed arrest  History of Present Illness Alexandra [name] is a 73 y.o. female with CAD s/p CABG [place], [place] in 2008), PAF, chronic LBBB, HTN, DM and SDH s/p evacuation 2 years ago who was admitted 06/10/2012 after witnessed "arrest" / syncopal episode. She has no recollection of the event. Her history regarding the event is obtained from her chart. She reports "feeling fine" all morning, had eaten breakfast and was in her usual state of health. Apparently while sitting in the Surgical Waiting room (sister having CABG) she lost consciousness suddenly, without warning or prodrome. Code Blue was called and a nearby cath lab technician started CPR as there was no palpable pulse. She had ROSC after CPR x 1 minute. There were no medications given. There was no shock. There is no information available regarding her rhythm at the time. She was combative upon regaining consciousness requiring Versed and Haldol in the ED. 12-lead ECG shows SR with LBBB. Troponin minimally elevated at 0.37. Potassium 4.1 and magnesium 1.4 on admission. TSH within normal range. Head CT showed no acute abnormality. EEG normal and Neurology felt inconsistent with seizure. Echo done revealing LVEF 50-55%, moderate LVH with apical and septal HK. Lexiscan Myoview today shows large infarct involving the apex and distal anterior and septal walls with mild peri-infarct ischemia, LVEF 40%. Plan is for cardiac cath tomorrow. Telemetry shows SR with very brief, nonsustained atrial tachycardia. EP has been asked to see for recommendations. She denies any recent trouble with CP, SOB, palpitations, dizziness or syncope. She denies any changes to her medications recently. She has never had  symptoms like this before.   Past Medical History Past Medical History  Diagnosis Date  . HTN (hypertension)   . CAD (coronary artery disease)     a. Remote CABG in 2008 in [place].  . DM (diabetes mellitus)   . HTN (hypertension)   . PAF (paroxysmal atrial fibrillation)     a. Not on Coumadin due to history of falls and SDH  . GERD (gastroesophageal reflux disease)   . Venous insufficiency   . Left bundle branch block   . Obesity   . Chronic anticoagulation   . Breast cancer   . Seizure     a. 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    Past Surgical History Past Surgical History  Procedure Laterality Date  . Partial hysterectomy      1985  . Total abdominal hysterectomy  1986  . Coronary artery bypass graft  2008    x3 2008.  [place] (Dr. Wende Neighbors) [facility] [phone]  . Cholecystectomy    . Subdural hematoma evacuation via craniotomy  2012    bil x 2    Allergies/Intolerances Allergies  Allergen Reactions  . Sulfa Antibiotics Rash    Current Home Medications      amLODipine 5 MG tablet  Commonly known as:  NORVASC  Take 5 mg by mouth daily.     aspirin 81 MG tablet  Take 81 mg by mouth daily.     atorvastatin 20 MG tablet  Commonly known as:  LIPITOR  Take 20 mg by mouth daily.     ergocalciferol 50000 UNITS capsule  Commonly known as:  VITAMIN D2  Take 50,000 Units by mouth once a  week.     insulin lispro protamine-lispro (75-25) 100 UNIT/ML Susp injection  Commonly known as:  HUMALOG 75/25 MIX  Inject 26 Units into the skin 2 (two) times daily with a meal.     lisinopril 5 MG tablet  Commonly known as:  PRINIVIL,ZESTRIL  Take 5 mg by mouth daily.     multivitamin with minerals Tabs tablet  Take 1 tablet by mouth daily.     nebivolol 10 MG tablet  Commonly known as:  BYSTOLIC  Take 20 mg by mouth 2 (two) times daily.     pantoprazole 40 MG tablet  Commonly known as:  PROTONIX  Take 40 mg by mouth daily.      sertraline 100 MG tablet  Commonly known as:  ZOLOFT  Take 100 mg by mouth daily.     Inpatient Medications . amLODipine  5 mg Oral Daily  . aspirin EC  81 mg Oral Daily  . atorvastatin  20 mg Oral Daily  . cefUROXime  500 mg Oral BID WC  . enoxaparin (LOVENOX) injection  30 mg Subcutaneous Q24H  . insulin aspart protamine- aspart  30 Units Subcutaneous BID WC  . magnesium oxide  400 mg Oral Daily  . nebivolol  20 mg Oral BID  . pantoprazole  40 mg Oral Daily  . sertraline  100 mg Oral Daily  . sodium chloride  3 mL Intravenous Q12H    Family History Family History  Problem Relation Age of Onset  . Emphysema Father   . Hypertension Father   . Hypertension Mother   . Stroke Mother   . Diabetes Mother   . Diabetes Brother      Social History History   Social History  . Marital Status: Widowed    Spouse Name: N/A    Number of Children: N/A  . Years of Education: N/A   Occupational History  . Not on file.   Social History Main Topics  . Smoking status: Never Smoker   . Smokeless tobacco: Never Used  . Alcohol Use: No  . Drug Use: No  . Sexual Activity: Not on file   Other Topics Concern  . Not on file   Social History Narrative  . No narrative on file     Review of Systems General: No chills, fever, night sweats or weight changes  Cardiovascular:  No chest pain, dyspnea on exertion, edema, orthopnea, palpitations, paroxysmal nocturnal dyspnea Dermatological: No rash, lesions or masses Respiratory: No cough, dyspnea Urologic: No hematuria, dysuria Abdominal: No nausea, vomiting, diarrhea, bright red blood per rectum, melena, or hematemesis Neurologic: No visual changes, weakness, changes in mental status All other systems reviewed and are otherwise negative except as noted above.  Physical Exam Vitals: Blood pressure 157/72, pulse 72, temperature 98.3 F (36.8 C), temperature source Oral, resp. rate 70, height 5\' 3"  (1.6 m), weight 185 lb 14.4 oz (84.324  kg), SpO2 100.00%.  General: Well developed, well appearing 73 y.o. female in no acute distress. HEENT: Normocephalic, atraumatic. EOMs intact. Sclera nonicteric. Oropharynx clear.  Neck: Supple without bruits. No JVD. Lungs: Respirations regular and unlabored, CTA bilaterally. No wheezes, rales or rhonchi. Heart: RRR. S1, S2 present. No murmurs, rub, S3 or S4. Abdomen: Soft, non-tender, non-distended. BS present x 4 quadrants. No hepatosplenomegaly.  Extremities: No clubbing, cyanosis or edema. DP/PT/Radials 2+ and equal bilaterally. Psych: Normal affect. Neuro: Alert and oriented X 3. Moves all extremities spontaneously. Musculoskeletal: No kyphosis. Skin: Intact. Warm and dry. No rashes or petechiae  in exposed areas.   Labs  Recent Labs  06/10/13 2010 06/10/13 2215 06/11/13 0430  TROPONINI 0.33* 0.37* <0.30   Lab Results  Component Value Date   WBC 4.4 06/12/2013   HGB 11.2* 06/12/2013   HCT 32.1* 06/12/2013   MCV 91.2 06/12/2013   PLT 100* 06/12/2013    Recent Labs Lab 06/13/13 0532  NA 139  K 4.0  CL 99  CO2 25  BUN 16  CREATININE 1.08  CALCIUM 9.2  GLUCOSE 381*    Recent Labs  06/10/13 2010  TSH 2.297    Radiology/Studies Ct Head Wo Contrast  06/10/2013   CLINICAL DATA:  Altered mental status.  EXAM: CT HEAD WITHOUT CONTRAST  TECHNIQUE: Contiguous axial images were obtained from the base of the skull through the vertex without intravenous contrast.  COMPARISON:  06/11/2011.  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 On the scout images, there is a wire projecting over the skull which is not visualized on the axial images. Mastoid air cells and paranasal sinuses are within normal limits. Intracranial atherosclerosis. No mass lesion, mass effect, midline shift, hydrocephalus, hemorrhage. No acute territorial cortical ischemia/infarct. Atrophy and chronic ischemic white matter disease is present.  IMPRESSION: Atrophy and chronic ischemic white matter disease without acute  intracranial abnormality.   Electronically Signed   By: Andreas Newport M.D.   On: 06/10/2013 13:16   Nm Myocar Multi W/spect W/wall Motion / Ef  06/13/2013   CLINICAL DATA:  Chest pain. History of hypertension and diabetes. Left bundle-branch block. History of breast cancer.  EXAM: MYOCARDIAL IMAGING WITH SPECT (REST AND PHARMACOLOGIC-STRESS)  GATED LEFT VENTRICULAR WALL MOTION STUDY  LEFT VENTRICULAR EJECTION FRACTION  TECHNIQUE: Standard myocardial SPECT imaging was performed after resting intravenous injection of 10 mCi Tc-53m sestamibi. Subsequently, intravenous infusion of Lexiscan was performed under the supervision of the Cardiology staff. At peak effect of the drug, 30 mCi Tc-70m sestamibi was injected intravenously and standard myocardial SPECT imaging was performed. Quantitative gated imaging was also performed to evaluate left ventricular wall motion, and estimate left ventricular ejection fraction.  COMPARISON:  DG CHEST 1V PORT dated 06/10/2013; CT CHEST W/O CM dated 03/25/2011  FINDINGS: SPECT images demonstrate a large infarct involving the apex and distal anterior and septal walls. There is possible minimal reversibility along the proximal margins of this infarct. The summed difference score is only 4.  Gated images were reviewed and demonstrate marked hypokinesis of the infarcted segments. There is aneurysmal dilatation and paradoxical motion of the distal septum. The QGS ejection fraction calculated at rest is 40% with an end-diastolic volume of 83ml and an end-systolic volume of 25ml.  IMPRESSION: 1. 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 Associated distal septal paradoxical motion. 2. Mild peri-infarct ischemia along the proximal margins of the infarct cannot be excluded. 3. Left ventricular ejection fraction 40%.   Electronically Signed   By: Roxy Horseman M.D.   On: 06/13/2013 13:26   Dg Chest Portable 1 View  06/10/2013   CLINICAL DATA:  Post arrest.  Diabetic  hypertensive patient.  EXAM: PORTABLE CHEST - 1 VIEW  COMPARISON:  03/21/2011 chest x-ray.  FINDINGS: Post CABG.  Heart size top-normal.  Pulmonary vascular prominence most notable centrally without frank pulmonary edema.  No segmental infiltrate or gross pneumothorax.  Remote right-sided rib fractures.  IMPRESSION: Post CABG. Heart size top-normal.  Pulmonary vascular prominence most notable centrally without frank pulmonary edema.  Remote right-sided rib fractures.   Electronically Signed  By: Chauncey Cruel M.D.   On: 06/10/2013 13:53    Echocardiogram  Study Conclusions - Left ventricle: Septal and apical hypokinesis The cavity size was mildly dilated. Wall thickness was increased in a pattern of moderate LVH. Systolic function was normal. The estimated ejection fraction was in the range of 50% to 55%. - Mitral valve: Calcified annulus. Mild regurgitation. - Left atrium: The atrium was mildly dilated. - Atrial septum: No defect or patent foramen ovale was identified.  12-lead ECG on admission shows sinus tachycardia with LBBB Telemetry - SR with very brief, nonsustained atrial tachycardia  Lexiscan Myoview IMPRESSION: 1. 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 Associated distal septal paradoxical motion. 2. Mild peri-infarct ischemia along the proximal margins of the infarct cannot be excluded. 3. Left ventricular ejection fraction 40%.  Assessment and Plan 1. Syncope / witnessed arrest 2. Abnormal Lexiscan Myoview with evidence of large infarct and peri-infarct ischemia (LVEF 40%, 50-55% by echo) 3. LBBB 4. CAD s/p CABG 5. PAF  6. Prior SDH s/p evacuation 2 years ago 7. recurrrent falls  Ms. Asher has been admitted after witnessed arrest which is unexplained at this time. Her Myoview is abnormal and we agree with definitive evaluation of her coronary anatomy with cardiac catheterization. LV gram will also be helpful for assessment of LVEF  since there is significant difference between echo findings and Myoview estimation. There is concern for VT given evidence of large infarct on Myoview. Bradyarrhythmias / transient AV block are also possiblities given her baseline conduction system disease / LBBB.    Dr. Caryl Comes to see. Please see recommendations below. Signed, Ileene Hutchinson, PA-C 06/13/2013, 3:03 PM  Pt with 2 year history of falls and subdural hematoma requiring evacuation and then abrupt onset syncope the other day.  She was midsentence.  With LBBB and large scar both bradyarrhythmia and tachyarrhythmia are potential culprits Her myoview notes interval change in EF, now 40%; it was her recollection that at the time of surgery she had "noheart damage" although echo EF 2010-11 was 45-50%  The plan is to undergo cath in am and further EP eval will depend on those findings If acute coronary issues,>>Rx and ?? If no progressive heart disease two strategies present themselves. The first would be empiric ICD implantation with syncope and ischemic cardiomyopathy, the second would be EPS, if Positive >>ICD , if neg  Either PM (classIIa indication) or loop insertion.  Given LV dysfunction will d/c amlodipine and begin ace and consider changee of nabivolol>> carvedilol or succinate or bisoprolol

## 2013-06-13 NOTE — Progress Notes (Addendum)
Subjective:  Awake and alert. Her chest is sore from CPR.  Objective:  Vital Signs in the last 24 hours: Temp:  [98.6 F (37 C)-99.4 F (37.4 C)] 99.1 F (37.3 C) (01/08 0436) Pulse Rate:  [72-96] 72 (01/08 0436) Resp:  [18] 18 (01/08 0436) BP: (134-176)/(52-87) 153/62 mmHg (01/08 0943) SpO2:  [93 %-97 %] 97 % (01/08 0436) Weight:  [185 lb 14.4 oz (84.324 kg)] 185 lb 14.4 oz (84.324 kg) (01/07 1300)  Intake/Output from previous day:  Intake/Output Summary (Last 24 hours) at 06/13/13 1000 Last data filed at 06/12/13 1500  Gross per 24 hour  Intake    675 ml  Output      0 ml  Net    675 ml    Physical Exam: General appearance: alert, cooperative and no distress Lungs: clear to auscultation bilaterally Heart: regular rate and rhythm Chest: sternal ecchymosis   Rate: 76  Rhythm: normal sinus rhythm  Lab Results:  Recent Labs  06/11/13 0430 06/12/13 1021  WBC 5.7 4.4  HGB 10.7* 11.2*  PLT 124* 100*    Recent Labs  06/11/13 0430 06/13/13 0532  NA 139 139  K 4.0 4.0  CL 102 99  CO2 26 25  GLUCOSE 240* 381*  BUN 20 16  CREATININE 1.10 1.08    Recent Labs  06/10/13 2215 06/11/13 0430  TROPONINI 0.37* <0.30    Recent Labs  06/10/13 1153  INR 1.01    Imaging: Imaging results have been reviewed  Cardiac Studies:  Assessment/Plan:   Principal Problem:   Syncope vs seizure- breif CPR 06/10/12 Active Problems:   CAD - CABG x 3 in 2008.    PAF (paroxysmal atrial fibrillation)   Prolonged QT interval- 548ms Zoloft d/c   Hypomagnesemia 1.4 on adm 06/10/12   DM (diabetes mellitus)   HTN (hypertension)   Subdural hematoma 2012 (not a Coumadin candidate)   Hyperlipidemia   LBBB (left bundle branch block)   Elevated troponin   Acute renal insufficiency- SCr 1.5 on admission 06/10/12    PLAN: Myoview today. SCr imroved to 1.07. Her  Mg++ is 1.7 after IV replacement, will place her on an oral dose.  Kerin Ransom PA-C Beeper [phone] 06/13/2013, 10:00  AM  As above, no chest pain or dyspnea; telemetry unremarkable; LV function preserved on echo; await results of myovue; if no ischemia and telemetry stable in AM, DC with event monitor. Note MG improved; repeat ECG. Kirk Ruths  I have discussed the patient with Dr. Caryl Comes. Given presentation he is concerned about the possibility of bradycardia mediated event or ventricular arrhythmia. Nuclear study shows an ejection fraction of 40%. There is a large infarct involving the apex, distal anterior and septal walls. There is mild peri-infarct ischemia. I will therefore plan to proceed with cardiac catheterization tomorrow. His grafts are patent will discuss with electrophysiology further management. Dr. Caryl Comes feels patient may require pacemaker. Kirk Ruths

## 2013-06-13 NOTE — Procedures (Signed)
ELECTROENCEPHALOGRAM REPORT   Patient: Alexandra [name]       Room #: 3w10 EEG No. ID:  Age: 73 y.o.        Sex: female Referring Physician: Conley Canal  Report Date:  06/13/2013        Interpreting Physician: Anthony Sar  History: Alexandra [name] is an 73 y.o. female admitted following an episode of loss of consciousness.   Indications for study:  Rule out new onset seizure disorder  Technique: This is an 18 channel routine scalp EEG performed at the bedside with bipolar and monopolar montages arranged in accordance to the international 10/20 system of electrode placement.   Description: This EEG recording was performed during wakefulness and during sleep. Predominant background activity during wakefulness consisted of normal of rhythm recorded from the posterior head regions with good attenuation with eye opening. Photic stimulation produced a symmetric occipital driving response. Hyperventilation was not performed. During sleep was symmetrical slowing of background activity with symmetrical vertex waves, sleep spindles and arousal responses reported. No epileptiform discharges recorded. There was no abnormal slowing.  Interpretation: This is a normal EEG recording during wakefulness and during sleep. No evidence of an epileptic disorder was recorded.   Rush Farmer M.D. [facility] [phone]

## 2013-06-13 NOTE — Progress Notes (Signed)
CBGs on 1/7:  [phone] mg/dl                  1/8: 320 mg/dl Continue Novolog SENSITIVE correction scale  AC and add HS scale along with 70/30 insulin.   Will continue to follow while in hospital.  Harvel Ricks RN BSN CDE

## 2013-06-13 NOTE — Progress Notes (Signed)
On call Md made aware of Mid[facility]. Will continue to monitor.

## 2013-06-14 ENCOUNTER — Encounter (HOSPITAL_COMMUNITY)
Admission: EM | Disposition: A | Discharge status: Home / Self Care | Payer: Self-pay | Source: Home / Self Care | Attending: Internal Medicine

## 2013-06-14 DIAGNOSIS — I469 Cardiac arrest, cause unspecified: Secondary | ICD-10-CM

## 2013-06-14 DIAGNOSIS — I2589 Other forms of chronic ischemic heart disease: Secondary | ICD-10-CM

## 2013-06-14 DIAGNOSIS — I2510 Atherosclerotic heart disease of native coronary artery without angina pectoris: Secondary | ICD-10-CM

## 2013-06-14 HISTORY — PX: IMPLANTABLE CARDIOVERTER DEFIBRILLATOR IMPLANT: SHX5473

## 2013-06-14 HISTORY — PX: IMPLANTABLE CARDIOVERTER DEFIBRILLATOR IMPLANT: SHX5860

## 2013-06-14 HISTORY — PX: LEFT HEART CATHETERIZATION WITH CORONARY/GRAFT ANGIOGRAM: SHX5450

## 2013-06-14 LAB — CBC
HEMATOCRIT: 36.2 % (ref 36.0–46.0)
HEMOGLOBIN: 12.7 g/dL (ref 12.0–15.0)
MCH: 31.8 pg (ref 26.0–34.0)
MCHC: 35.1 g/dL (ref 30.0–36.0)
MCV: 90.5 fL (ref 78.0–100.0)
Platelets: 133 10*3/uL — ABNORMAL LOW (ref 150–400)
RBC: 4 MIL/uL (ref 3.87–5.11)
RDW: 12.3 % (ref 11.5–15.5)
WBC: 6.2 10*3/uL (ref 4.0–10.5)

## 2013-06-14 LAB — BASIC METABOLIC PANEL
BUN: 16 mg/dL (ref 6–23)
CO2: 24 mEq/L (ref 19–32)
CREATININE: 1.01 mg/dL (ref 0.50–1.10)
Calcium: 9.3 mg/dL (ref 8.4–10.5)
Chloride: 105 mEq/L (ref 96–112)
GFR calc Af Amer: 63 mL/min — ABNORMAL LOW (ref >90)
GFR, EST NON AFRICAN AMERICAN: 54 mL/min — AB (ref >90)
Glucose, Bld: 203 mg/dL — ABNORMAL HIGH (ref 70–99)
Potassium: 3.8 mEq/L (ref 3.7–5.3)
SODIUM: 141 meq/L (ref 137–147)

## 2013-06-14 LAB — GLUCOSE, CAPILLARY
GLUCOSE-CAPILLARY: 230 mg/dL — AB (ref 70–99)
GLUCOSE-CAPILLARY: 176 mg/dL — AB (ref 70–99)
GLUCOSE-CAPILLARY: 460 mg/dL — AB (ref 70–99)
Glucose-Capillary: 237 mg/dL — ABNORMAL HIGH (ref 70–99)
Glucose-Capillary: 368 mg/dL — ABNORMAL HIGH (ref 70–99)
Glucose-Capillary: 465 mg/dL — ABNORMAL HIGH (ref 70–99)

## 2013-06-14 LAB — MAGNESIUM: Magnesium: 1.7 mg/dL (ref 1.5–2.5)

## 2013-06-14 SURGERY — IMPLANTABLE CARDIOVERTER DEFIBRILLATOR IMPLANT
Anesthesia: LOCAL

## 2013-06-14 SURGERY — LEFT HEART CATHETERIZATION WITH CORONARY/GRAFT ANGIOGRAM
Anesthesia: LOCAL

## 2013-06-14 MED ORDER — CEFAZOLIN SODIUM-DEXTROSE 2-3 GM-% IV SOLR
2.0000 g | INTRAVENOUS | Status: DC
  Filled 2013-06-14: qty 50

## 2013-06-14 MED ORDER — MIDAZOLAM HCL 5 MG/5ML IJ SOLN
INTRAMUSCULAR | Status: AC
  Filled 2013-06-14: qty 5

## 2013-06-14 MED ORDER — LIDOCAINE HCL (PF) 1 % IJ SOLN
INTRAMUSCULAR | Status: AC
  Filled 2013-06-14: qty 30

## 2013-06-14 MED ORDER — NITROGLYCERIN 0.2 MG/ML ON CALL CATH LAB
INTRAVENOUS | Status: AC
  Filled 2013-06-14: qty 1

## 2013-06-14 MED ORDER — INSULIN ASPART PROT & ASPART (70-30 MIX) 100 UNIT/ML ~~LOC~~ SUSP: 26.0000 [IU] | Freq: Two times a day (BID) | SUBCUTANEOUS | Status: DC

## 2013-06-14 MED ORDER — HEPARIN (PORCINE) IN NACL 2-0.9 UNIT/ML-% IJ SOLN
INTRAMUSCULAR | Status: AC
  Filled 2013-06-14: qty 500

## 2013-06-14 MED ORDER — FENTANYL CITRATE 0.05 MG/ML IJ SOLN
INTRAMUSCULAR | Status: AC
  Filled 2013-06-14: qty 2

## 2013-06-14 MED ORDER — CHLORHEXIDINE GLUCONATE 4 % EX LIQD: 60.0000 mL | Freq: Once | CUTANEOUS | Status: DC

## 2013-06-14 MED ORDER — LIDOCAINE HCL (PF) 1 % IJ SOLN
INTRAMUSCULAR | Status: AC
Start: 2013-06-14 — End: 2013-06-14
  Filled 2013-06-14: qty 30

## 2013-06-14 MED ORDER — SODIUM CHLORIDE 0.9 % IR SOLN
80.0000 mg | Status: DC
  Filled 2013-06-14: qty 2

## 2013-06-14 MED ORDER — HEPARIN (PORCINE) IN NACL 2-0.9 UNIT/ML-% IJ SOLN
INTRAMUSCULAR | Status: AC
  Filled 2013-06-14: qty 1000

## 2013-06-14 MED ORDER — ACETAMINOPHEN 325 MG PO TABS: 650.0000 mg | ORAL_TABLET | ORAL | Status: DC | PRN

## 2013-06-14 MED ORDER — SODIUM CHLORIDE 0.9 % IV SOLN
INTRAVENOUS | Status: DC
  Administered 2013-06-14: 10:00:00 via INTRAVENOUS

## 2013-06-14 MED ORDER — HYDROCODONE-ACETAMINOPHEN 5-325 MG PO TABS: 1.0000 | ORAL_TABLET | ORAL | Status: DC | PRN

## 2013-06-14 MED ORDER — INSULIN ASPART PROT & ASPART (70-30 MIX) 100 UNIT/ML ~~LOC~~ SUSP
26.0000 [IU] | Freq: Two times a day (BID) | SUBCUTANEOUS | Status: DC
  Administered 2013-06-14: 26 [IU] via SUBCUTANEOUS
  Filled 2013-06-14: qty 10

## 2013-06-14 MED ORDER — SODIUM CHLORIDE 0.9 % IJ SOLN
3.0000 mL | Freq: Two times a day (BID) | INTRAMUSCULAR | Status: DC
  Administered 2013-06-15 (×2): 3 mL via INTRAVENOUS

## 2013-06-14 MED ORDER — SODIUM CHLORIDE 0.9 % IV SOLN: INTRAVENOUS | Status: DC

## 2013-06-14 MED ORDER — CEFAZOLIN SODIUM 1-5 GM-% IV SOLN
1.0000 g | Freq: Four times a day (QID) | INTRAVENOUS | Status: AC
  Administered 2013-06-14 – 2013-06-15 (×3): 1 g via INTRAVENOUS
  Filled 2013-06-14 (×3): qty 50

## 2013-06-14 MED ORDER — SODIUM CHLORIDE 0.9 % IV SOLN: 250.0000 mL | INTRAVENOUS | Status: DC | PRN

## 2013-06-14 MED ORDER — ONDANSETRON HCL 4 MG/2ML IJ SOLN: 4.0000 mg | Freq: Four times a day (QID) | INTRAMUSCULAR | Status: DC | PRN

## 2013-06-14 MED ORDER — ACETAMINOPHEN 325 MG PO TABS
325.0000 mg | ORAL_TABLET | ORAL | Status: DC | PRN
  Administered 2013-06-14 – 2013-06-17 (×7): 650 mg via ORAL
  Filled 2013-06-14 (×7): qty 2

## 2013-06-14 MED ORDER — SODIUM CHLORIDE 0.9 % IJ SOLN: 3.0000 mL | INTRAMUSCULAR | Status: DC | PRN

## 2013-06-14 MED ORDER — MIDAZOLAM HCL 2 MG/2ML IJ SOLN
INTRAMUSCULAR | Status: AC
Start: 2013-06-14 — End: 2013-06-14
  Filled 2013-06-14: qty 2

## 2013-06-14 NOTE — Interval H&P Note (Signed)
ICD Criteria  Current LVEF (within 6 months):40%  NYHA Functional Classification: Class II  Heart Failure History:  No.  Non-Ischemic Dilated Cardiomyopathy History:  No.  Atrial Fibrillation/Atrial Flutter:  Yes, A-Fib/A-Flutter type: Paroxysmal.  Ventricular Tachycardia History:  No.  Cardiac Arrest History:  Yes, It is unknown whether arrest was Ventricular Tachycardia/Ventricular Fibrillation. It is unknown whether this was a bradycardia arrest.  History of Syndromes with Risk of Sudden Death:  No.  Previous ICD:  No.  Electrophysiology Study: No.  Prior MI: Yes, Most recent MI timeframe is > 40 days.  PPM: No.  OSA:  No  Patient Life Expectancy of >=1 year: Yes.  Anticoagulation Therapy:  Patient is NOT on anticoagulation therapy.   Beta Blocker Therapy:  Yes.   Ace Inhibitor/ARB Therapy:  Yes.  History and Physical Interval Note:  06/14/2013 1:37 PM  Alexandra [name]  has presented today for surgery, with the diagnosis of Heart failure  The various methods of treatment have been discussed with the patient and family. After consideration of risks, benefits and other options for treatment, the patient has consented to  Procedure(s): IMPLANTABLE CARDIOVERTER DEFIBRILLATOR IMPLANT (N/A) as a surgical intervention .  The patient's history has been reviewed, patient examined, no change in status, stable for surgery.  I have reviewed the patient's chart and labs.  Questions were answered to the patient's satisfaction.     Thompson Grayer

## 2013-06-14 NOTE — Interval H&P Note (Signed)
Cath Lab Visit (complete for each Cath Lab visit)  Clinical Evaluation Leading to the Procedure:   ACS: yes  Non-ACS:    Anginal Classification: CCS IV  Anti-ischemic medical therapy: Maximal Therapy (2 or more classes of medications)  Non-Invasive Test Results: High-risk stress test findings: cardiac mortality >3%/year  Prior CABG: Previous CABG      History and Physical Interval Note:  06/14/2013 7:46 AM  Alexandra [name]  has presented today for surgery, with the diagnosis of Abnormal stress test  The various methods of treatment have been discussed with the patient and family. After consideration of risks, benefits and other options for treatment, the patient has consented to  Procedure(s): LEFT HEART CATHETERIZATION WITH CORONARY/GRAFT ANGIOGRAM (N/A) as a surgical intervention .  The patient's history has been reviewed, patient examined, no change in status, stable for surgery.  I have reviewed the patient's chart and labs.  Questions were answered to the patient's satisfaction.     Shekinah Pitones A

## 2013-06-14 NOTE — Op Note (Signed)
SURGEON:  Thompson Grayer, MD      PREPROCEDURE DIAGNOSES:   1. Aborted sudden cardiac death.   2. Ischemic Cardiomyopathy     POSTPROCEDURE DIAGNOSES:   1. Aborted sudden cardiac death.   2. Ischemic Cardiomyopathy     PROCEDURES:    1. ICD implantation.  2. Left upper extremity venography  3. Defibrillation threshold testing     INTRODUCTION: Alexandra [name] is a 73 y.o. female with an ischemic CM (EF 40%), NYHA Class II CHF, and CAD.  She is admitted with aborted sudden cardiac death.  This is felt to be due to an arrhythmic cause.  Cath reveals no lesion amenable to intervention.  At this time, she meets criteria for ICD implantation for secondary prevention of sudden death.  The patient has a LBBB though her QRS is only 132 msec.  Given NYHA Class II symptoms and relatively narrow qrs, I do not feel that she would likely benefit from revascularization.  The patient has been treated with an optimal medical regimen but continues to have a depressed ejection fraction.  The patient therefore  presents today for ICD implantation.      DESCRIPTION OF PROCEDURE:  Informed written consent was obtained and the patient was brought to the electrophysiology lab in the fasting state. The patient was adequately sedated with intravenous Versed, and fentanyl as outlined in the nursing report.  The patient's left chest was prepped and draped in the usual sterile fashion by the EP lab staff.  The skin overlying the left deltopectoral region was infiltrated with lidocaine for local analgesia.  A 5-cm incision was made over the left deltopectoral region.  A left subcutaneous defibrillator pocket was fashioned using a combination of sharp and blunt dissection.  Electrocautery was used to assure hemostasis.   Left Upper extremity Venography:  A venogram of the left upper extremity was performed which revealed a moderate sized left axillary vein which emptied into a moderate sized left subclavian vein.    RA/RV  Lead Placement: The left axillary vein was cannulated with fluoroscopic visualization.  Through the left axillary vein, a [employer] Tendril STS, model [id number]  (serial # [id number]  ) right atrial lead and a [employer] Ferney, model 7122-65 (serial number [id number]) right ventricular defibrillator lead were advanced with fluoroscopic visualization into the right atrial appendage and right ventricular apex positions respectively.  Initial atrial lead P-waves measured 2.9 mV with an impedance of 619 ohms and a threshold of 1.2 volts at 0.5 milliseconds.  The right ventricular lead R-wave measured 13.6 mV with impedance of 649 ohms and a threshold of 0.9 volts at 0.5 milliseconds.   The leads were secured to the pectoralis  fascia using #2 silk suture over the suture sleeves.  The pocket then  irrigated with copious gentamicin solution.  The leads were then  connected to a Jackson DR model [phone] (serial  Number [id number]) ICD.  The defibrillator was placed into the  pocket.  The pocket was then closed in 2 layers with 2.0 Vicryl suture  for the subcutaneous and subcuticular layers.  Steri-Strips and a  sterile dressing were then applied.   DFT Testing: Defibrillation Threshold testing was then performed. Ventricular fibrillation was induced with a T shock.  Adequate sensing of ventricular  fibrillation was observed with minimal dropout with a programmed sensitivity of 1.42mV.  The patient was successfully defibrillated to sinus rhythm with a single 15 joules shock delivered from the device  with an impedance of 58 ohms in a duration of 5.3 seconds.  The patient remained in sinus rhythm thereafter.  There were no early apparent complications.  Programmed Extrastimulus testing:  Programmed extrastimulus testing was performed through the device with a basic cycle length of 557msec with S1,S2,S3,S4 extrastimuli down to refractoriness (500/290/220/200 msec) with no sustained  VT or VF observed.  The procedure was therefore considered completed.  There were no early apparhent complications.     CONCLUSIONS:   1. Ischemic cardiomyopathy with aborted sudden cardiac death felt to be arrhythmogenic in nature  2. Successful ICD implantation.   3. DFT less than or equal to 15 joules.   4. No early apparent complications.

## 2013-06-14 NOTE — CV Procedure (Signed)
Alexandra [name] is a 73 y.o. female    [id number]  [id number] LOCATION:  FACILITY: [place]  PHYSICIAN: Troy Sine, MD, [facility] 1940-08-26   DATE OF PROCEDURE:  06/14/2013    CARDIAC CATHETERIZATION     HISTORY:  Alexandra [name] is a 73 year old female who underwent CABG revascularization surgery in [place] in 2008 at which time she had a LIMA to her LAD, vein graft to a marginal vessel, and vein graft to her distal right coronary artery. She has a history of chronic left bundle branch block, paroxysmal atrial fibrillation, hypertension, diabetes mellitus, previous dural hematoma status post evacuation, and apparently suffered a syncopal spell/[facility] " [facility]. At that time CPR was started she was admitted. Troponin was mildly positive. A Lexiscan Myoview study demonstrated large scar involving the apex and distal anterior and septal walls with mild peri-infarction ischemia with an ejection fraction of 40%. She is now referred for definitive cardiac catheterization.   PROCEDURE:  The patient was brought to the second floor [facility] cath lab in the postabsorptive state. She was premedicated with Versed 1 mg and fentanyl 25 mcg. Her right groin was prepped and shaved in usual sterile fashion. Xylocaine 1% was used for local anesthesia. A 5 French sheath was inserted into the right femoral artery. Diagnostic catheterizatiion was done with 5French LF4, FR4, and pigtail catheters. Left ventriculography was done with25cc Omnipaque contrast. Hemostasis was obtained by direct manual compression. The patient tolerated the procedure well.   HEMODYNAMICS:   Central Aorta: 170/73   Left Ventricle: 170/9  ANGIOGRAPHY:  Fluoroscopy reveals significant coronary calcification the most prominent involving the proximal LAD and diffusely involving the right coronary artery.  1. Left main: Angiographically normal vessel which bifurcated  into the LAD and left circumflex system.  2. LAD: Proximal calcification with 50% narrowing followed by 40% narrowing before the first diagonal vessel. In the mid LAD there was a "flush and fill "phenomenon due to competitive filling from the LIMA graft. 3. Left circumflex: Moderate size vessel that gave rise to 2 marginal vessels visualize from the native injection. The distal circumflex marginal was not visualized on the native injection.  4. Right coronary artery: Severely calcified vessel with 50% proximal stenosis followed by 95% stenosis proximal to anterior RV marginal branch with 70% narrowing at the origin of this marginal branch. The RCA was occluded after this marginal branch. 5. LIMA TO LAD: Widely patent LIMA supplying anastomosing into the mid LAD. The distal LAD was small caliber. 6. SVG TO the distal circumflex marginal vessel was widely patent 7. SVG TO distal RCA was patent there was mild 30% proximal smooth narrowing and anastomosed into a large PLA-like vessel. There was filling of the native RCA retrograde back up to the point of mid occlusion.      RAO ventriculography revealed a duction fraction of approximately 45%. There was mild mid distal anterolateral hypocontractility.  IMPRESSION:  Significant coronary calcification in native coronary artery disease with diffuse 50% proximal LAD stenoses followed by 40% stenosis; occluded distal marginal branch of the circumflex; and severely calcified native RCA with proximal 50 followed by a mid 95% stenosis and 70% stenosis in the anterior RV marginal branch with total occlusion of the RCA at this anterior RV marginal branch.  Patent LIMA graft supplying the mid LAD.  Patent SVG graft supplying the distal circumflex marginal vessel.  Patent large SVG supplying the distal right coronary PLA branch. There is mild 30%  narrowing in the proximal third of the graft.   Troy Sine, MD, [facility] 06/14/2013 6:18 PM

## 2013-06-14 NOTE — Progress Notes (Signed)
[facility] PROGRESS NOTE  Kadi Hession ZMO:[id number] DOB: 1940/07/13 DOA: 06/10/2013 PCP: Stephens Shire, MD  Assessment/Plan: 1. Syncope: ICD placed today. 2. Coronary artery disease: Cath report not yet available 3. Prolonged QTc interval/LBBB:  4. Hypomagnesemia: corrected 5. UTI: cx neg. on oral antibiotics 6. Hypertension.  7. DM uncontrolled: Resume 70/30 insulin today. 8. Depression: resume zoloft  Disposition per cardiology. On Saturday?  Code Status: *Full code Family Communication: *none atbedside Disposition Plan: Home when stable   Consultants:  Neurology   Cardiology  ep   HPI/Subjective: Some soreness, otherwise no complaints.  Objective: Filed Vitals:   06/14/13 1800  BP: 111/57  Pulse:   Temp:   Resp:    No intake or output data in the 24 hours ending 06/14/13 2032 Filed Weights   06/12/13 1300  Weight: 84.324 kg (185 lb 14.4 oz)    Exam:   Lungs: CTA without WRR  Heart: RRRR without MGR Abdomen: BS normoactive. Soft, Nondistended, non-tender.  Extremities: No pretibial edema, no erythema Neuro: nonfocal  Data Reviewed: Echo Left ventricle: Septal and apical hypokinesis The cavity size was mildly dilated. Wall thickness was increased in a pattern of moderate LVH. Systolic function was normal. The estimated ejection fraction was in the range of 50% to 55%. - Mitral valve: Calcified annulus. Mild regurgitation. - Left atrium: The atrium was mildly dilated. - Atrial septum: No defect or patent foramen ovale was identified.  Basic Metabolic Panel:  Recent Labs Lab 06/10/13 1235 06/10/13 2010 06/11/13 0430 06/12/13 1021 06/13/13 0532 06/14/13 0700  NA 141  --  139  --  139 141  K 4.1  --  4.0  --  4.0 3.8  CL 104  --  102  --  99 105  CO2  --   --  26  --  25 24  GLUCOSE 118*  --  240*  --  381* 203*  BUN 28*  --  20  --  16 16  CREATININE 1.50*  --  1.10  --  1.08 1.01  CALCIUM  --   --  8.6  --  9.2 9.3  MG   --  1.4*  --  1.3* 1.7 1.7  PHOS  --  3.6  --   --   --   --    Liver Function Tests: No results found for this basename: AST, ALT, ALKPHOS, BILITOT, PROT, ALBUMIN,  in the last 168 hours No results found for this basename: LIPASE, AMYLASE,  in the last 168 hours No results found for this basename: AMMONIA,  in the last 168 hours CBC:  Recent Labs Lab 06/10/13 1153 06/10/13 1235 06/11/13 0430 06/12/13 1021 06/14/13 0430  WBC 14.9*  --  5.7 4.4 6.2  NEUTROABS 7.7  --   --   --   --   HGB 12.8 13.6 10.7* 11.2* 12.7  HCT 37.3 40.0 30.7* 32.1* 36.2  MCV 94.9  --  91.6 91.2 90.5  PLT 198  --  124* 100* 133*   Cardiac Enzymes:  Recent Labs Lab 06/10/13 2010 06/10/13 2215 06/11/13 0430  TROPONINI 0.33* 0.37* <0.30   BNP (last 3 results) No results found for this basename: PROBNP,  in the last 8760 hours CBG:  Recent Labs Lab 06/13/13 1616 06/13/13 2114 06/14/13 0945 06/14/13 1119 06/14/13 1620  GLUCAP 469* 368* 230* 237* 176*    Recent Results (from the past 240 hour(s))  URINE CULTURE     Status: None   Collection Time  06/10/13  1:56 PM      Result Value Range Status   Specimen Description URINE, CLEAN CATCH   Final   Special Requests NONE   Final   Culture  Setup Time     Final   Value: 06/10/2013 15:57     Performed at [facility]     Final   Value: 50,000 COLONIES/ML     Performed at [facility]   Culture     Final   Value: Multiple bacterial morphotypes present, none predominant. Suggest appropriate recollection if clinically indicated.     Performed at [facility]   Report Status 06/11/2013 FINAL   Final     Studies: Nm Myocar Multi W/spect W/wall Motion / Ef  06/13/2013   CLINICAL DATA:  Chest pain. History of hypertension and diabetes. Left bundle-branch block. History of breast cancer.  EXAM: MYOCARDIAL IMAGING WITH SPECT (REST AND PHARMACOLOGIC-STRESS)  GATED LEFT VENTRICULAR WALL MOTION STUDY  LEFT  VENTRICULAR EJECTION FRACTION  TECHNIQUE: Standard myocardial SPECT imaging was performed after resting intravenous injection of 10 mCi Tc-40m sestamibi. Subsequently, intravenous infusion of Lexiscan was performed under the supervision of the Cardiology staff. At peak effect of the drug, 30 mCi Tc-44m sestamibi was injected intravenously and standard myocardial SPECT imaging was performed. Quantitative gated imaging was also performed to evaluate left ventricular wall motion, and estimate left ventricular ejection fraction.  COMPARISON:  DG CHEST 1V PORT dated 06/10/2013; CT CHEST W/O CM dated 03/25/2011  FINDINGS: SPECT images demonstrate a large infarct involving the apex and distal anterior and septal walls. There is possible minimal reversibility along the proximal margins of this infarct. The summed difference score is only 4.  Gated images were reviewed and demonstrate marked hypokinesis of the infarcted segments. There is aneurysmal dilatation and paradoxical motion of the distal septum. The QGS ejection fraction calculated at rest is 40% with an end-diastolic volume of 80DX and an end-systolic volume of 83JA.  IMPRESSION: 1. 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 Associated distal septal paradoxical motion. 2. Mild peri-infarct ischemia along the proximal margins of the infarct cannot be excluded. 3. Left ventricular ejection fraction 40%.   Electronically Signed   By: Camie Patience M.D.   On: 06/13/2013 13:26    Scheduled Meds: . amLODipine  5 mg Oral Daily  . aspirin EC  81 mg Oral Daily  . atorvastatin  20 mg Oral Daily  .  ceFAZolin (ANCEF) IV  1 g Intravenous Q6H  . insulin aspart protamine- aspart  26 Units Subcutaneous BID WC  . insulin detemir  15 Units Subcutaneous Once  . magnesium oxide  400 mg Oral Daily  . nebivolol  20 mg Oral BID  . pantoprazole  40 mg Oral Daily  . sertraline  100 mg Oral Daily  . sodium chloride  3 mL Intravenous Q12H    Continuous Infusions:    Time spent: 25 min  Phil Corti L  [facility] Pager [phone]. If 7PM-7AM, please contact night-coverage at [email or web address], password [facility] 06/14/2013, 8:32 PM  LOS: 4 days

## 2013-06-14 NOTE — Progress Notes (Signed)
EF 40% by echo.  LBBB but with QRS 132.  Given sudden/ witnessed arrest and syncope, I agree with Dr Caryl Comes that ICD implantation is advised as she has no lesions amenable to coronary intervention.  The patient has an ischemic CM (EF 40%), NYHA Class II CHF, and syncope/ aborted sudden death. At this time, she meets criteria for ICD implantation for secondary prevention of sudden death.  As her QRS is only 132 msec, I would not advised CRT at this  Point given her paucity of CHF symptoms.  Risks, benefits, alternatives to ICD implantation were discussed in detail with the patient today. The patient  understands that the risks include but are not limited to bleeding, infection, pneumothorax, perforation, tamponade, vascular damage, renal failure, MI, stroke, death, inappropriate shocks, and lead dislodgement and wishes to proceed.  We will therefore schedule device implantation at the next available time.

## 2013-06-14 NOTE — H&P (View-Only) (Signed)
Subjective:  Awake and alert. Her chest is sore from CPR.  Objective:  Vital Signs in the last 24 hours: Temp:  [98.6 F (37 C)-99.4 F (37.4 C)] 99.1 F (37.3 C) (01/08 0436) Pulse Rate:  [72-96] 72 (01/08 0436) Resp:  [18] 18 (01/08 0436) BP: (134-176)/(52-87) 153/62 mmHg (01/08 0943) SpO2:  [93 %-97 %] 97 % (01/08 0436) Weight:  [185 lb 14.4 oz (84.324 kg)] 185 lb 14.4 oz (84.324 kg) (01/07 1300)  Intake/Output from previous day:  Intake/Output Summary (Last 24 hours) at 06/13/13 1000 Last data filed at 06/12/13 1500  Gross per 24 hour  Intake    675 ml  Output      0 ml  Net    675 ml    Physical Exam: General appearance: alert, cooperative and no distress Lungs: clear to auscultation bilaterally Heart: regular rate and rhythm Chest: sternal ecchymosis   Rate: 76  Rhythm: normal sinus rhythm  Lab Results:  Recent Labs  06/11/13 0430 06/12/13 1021  WBC 5.7 4.4  HGB 10.7* 11.2*  PLT 124* 100*    Recent Labs  06/11/13 0430 06/13/13 0532  NA 139 139  K 4.0 4.0  CL 102 99  CO2 26 25  GLUCOSE 240* 381*  BUN 20 16  CREATININE 1.10 1.08    Recent Labs  06/10/13 2215 06/11/13 0430  TROPONINI 0.37* <0.30    Recent Labs  06/10/13 1153  INR 1.01    Imaging: Imaging results have been reviewed  Cardiac Studies:  Assessment/Plan:   Principal Problem:   Syncope vs seizure- breif CPR 06/10/12 Active Problems:   CAD - CABG x 3 in 2008.    PAF (paroxysmal atrial fibrillation)   Prolonged QT interval- 516ms Zoloft d/c   Hypomagnesemia 1.4 on adm 06/10/12   DM (diabetes mellitus)   HTN (hypertension)   Subdural hematoma 2012 (not a Coumadin candidate)   Hyperlipidemia   LBBB (left bundle branch block)   Elevated troponin   Acute renal insufficiency- SCr 1.5 on admission 06/10/12    PLAN: Myoview today. SCr imroved to 1.07. Her  Mg++ is 1.7 after IV replacement, will place her on an oral dose.  Luke Kilroy PA-C [facility] [phone] 06/13/2013, 10:00  AM  As above, no chest pain or dyspnea; telemetry unremarkable; LV function preserved on echo; await results of myovue; if no ischemia and telemetry stable in AM, DC with event monitor. Note MG improved; repeat ECG. Brian Crenshaw  I have discussed the patient with Dr. Klein. Given presentation he is concerned about the possibility of bradycardia mediated event or ventricular arrhythmia. Nuclear study shows an ejection fraction of 40%. There is a large infarct involving the apex, distal anterior and septal walls. There is mild peri-infarct ischemia. I will therefore plan to proceed with cardiac catheterization tomorrow. His grafts are patent will discuss with electrophysiology further management. Dr. Klein feels patient may require pacemaker. Brian Crenshaw  

## 2013-06-14 NOTE — H&P (View-Only) (Signed)
EF 40% by echo.  LBBB but with QRS 132.  Given sudden/ witnessed arrest and syncope, I agree with Dr Klein that ICD implantation is advised as she has no lesions amenable to coronary intervention.  The patient has an ischemic CM (EF 40%), NYHA Class II CHF, and syncope/ aborted sudden death. At this time, she meets criteria for ICD implantation for secondary prevention of sudden death.  As her QRS is only 132 msec, I would not advised CRT at this  Point given her paucity of CHF symptoms.  Risks, benefits, alternatives to ICD implantation were discussed in detail with the patient today. The patient  understands that the risks include but are not limited to bleeding, infection, pneumothorax, perforation, tamponade, vascular damage, renal failure, MI, stroke, death, inappropriate shocks, and lead dislodgement and wishes to proceed.  We will therefore schedule device implantation at the next available time.  

## 2013-06-14 NOTE — Progress Notes (Signed)
Utilization review completed.  

## 2013-06-15 ENCOUNTER — Inpatient Hospital Stay (HOSPITAL_COMMUNITY): Payer: PRIVATE HEALTH INSURANCE

## 2013-06-15 ENCOUNTER — Encounter (HOSPITAL_COMMUNITY): Payer: Self-pay | Admitting: Physician Assistant

## 2013-06-15 LAB — GLUCOSE, CAPILLARY
GLUCOSE-CAPILLARY: 324 mg/dL — AB (ref 70–99)
Glucose-Capillary: 235 mg/dL — ABNORMAL HIGH (ref 70–99)
Glucose-Capillary: 364 mg/dL — ABNORMAL HIGH (ref 70–99)
Glucose-Capillary: 330 mg/dL — ABNORMAL HIGH (ref 70–99)

## 2013-06-15 LAB — BASIC METABOLIC PANEL
BUN: 21 mg/dL (ref 6–23)
CALCIUM: 8.7 mg/dL (ref 8.4–10.5)
CO2: 23 meq/L (ref 19–32)
CREATININE: 1.23 mg/dL — AB (ref 0.50–1.10)
Chloride: 100 mEq/L (ref 96–112)
GFR calc Af Amer: 50 mL/min — ABNORMAL LOW (ref >90)
GFR calc non Af Amer: 43 mL/min — ABNORMAL LOW (ref >90)
Glucose, Bld: 283 mg/dL — ABNORMAL HIGH (ref 70–99)
Potassium: 4.6 mEq/L (ref 3.7–5.3)
Sodium: 137 mEq/L (ref 137–147)

## 2013-06-15 MED ORDER — INSULIN ASPART PROT & ASPART (70-30 MIX) 100 UNIT/ML ~~LOC~~ SUSP
30.0000 [IU] | Freq: Two times a day (BID) | SUBCUTANEOUS | Status: DC
  Administered 2013-06-15: 30 [IU] via SUBCUTANEOUS

## 2013-06-15 MED ORDER — INSULIN ASPART PROT & ASPART (70-30 MIX) 100 UNIT/ML ~~LOC~~ SUSP
50.0000 [IU] | Freq: Two times a day (BID) | SUBCUTANEOUS | Status: DC
  Administered 2013-06-15 – 2013-06-17 (×4): 50 [IU] via SUBCUTANEOUS

## 2013-06-15 NOTE — Progress Notes (Signed)
[facility] PROGRESS NOTE  Alexandra [name] DUK:[id number] DOB: 1940-06-19 DOA: 06/10/2013 PCP: Alexandra Shire, MD  Assessment/Plan: 1. Syncope: ICD placed 1/9. 2. Coronary artery disease: patent grafts 3. UTI: cx neg, treated. D/c abx 4. Hypertension.  5. DM uncontrolled: increase 70/30 6. Depression:  zoloft  D/c in am if stable. Appreciate cardiology  Code Status: *Full code Family Communication: *none atbedside Disposition Plan: Home when stable   Consultants:  Neurology   Cardiology  ep   HPI/Subjective: Some soreness, otherwise no complaints.  Objective: Filed Vitals:   06/15/13 1200  BP: 155/74  Pulse: 70  Temp: 98.2 F (36.8 C)  Resp: 18    Intake/Output Summary (Last 24 hours) at 06/15/13 1836 Last data filed at 06/15/13 1300  Gross per 24 hour  Intake    840 ml  Output      0 ml  Net    840 ml   Filed Weights   06/12/13 1300  Weight: 84.324 kg (185 lb 14.4 oz)    Exam:   Lungs: CTA without WRR  Heart: RRRR without MGR Abdomen: BS normoactive. Soft, Nondistended, non-tender.  Extremities: No pretibial edema, no erythema Neuro: nonfocal  Data Reviewed: Echo Left ventricle: Septal and apical hypokinesis The cavity size was mildly dilated. Wall thickness was increased in a pattern of moderate LVH. Systolic function was normal. The estimated ejection fraction was in the range of 50% to 55%. - Mitral valve: Calcified annulus. Mild regurgitation. - Left atrium: The atrium was mildly dilated. - Atrial septum: No defect or patent foramen ovale was identified.  Basic Metabolic Panel:  Recent Labs Lab 06/10/13 1235 06/10/13 2010 06/11/13 0430 06/12/13 1021 06/13/13 0532 06/14/13 0700 06/15/13 0445  NA 141  --  139  --  139 141 137  K 4.1  --  4.0  --  4.0 3.8 4.6  CL 104  --  102  --  99 105 100  CO2  --   --  26  --  25 24 23   GLUCOSE 118*  --  240*  --  381* 203* 283*  BUN 28*  --  20  --  16 16 21   CREATININE 1.50*  --   1.10  --  1.08 1.01 1.23*  CALCIUM  --   --  8.6  --  9.2 9.3 8.7  MG  --  1.4*  --  1.3* 1.7 1.7  --   PHOS  --  3.6  --   --   --   --   --    Liver Function Tests: No results found for this basename: AST, ALT, ALKPHOS, BILITOT, PROT, ALBUMIN,  in the last 168 hours No results found for this basename: LIPASE, AMYLASE,  in the last 168 hours No results found for this basename: AMMONIA,  in the last 168 hours CBC:  Recent Labs Lab 06/10/13 1153 06/10/13 1235 06/11/13 0430 06/12/13 1021 06/14/13 0430  WBC 14.9*  --  5.7 4.4 6.2  NEUTROABS 7.7  --   --   --   --   HGB 12.8 13.6 10.7* 11.2* 12.7  HCT 37.3 40.0 30.7* 32.1* 36.2  MCV 94.9  --  91.6 91.2 90.5  PLT 198  --  124* 100* 133*   Cardiac Enzymes:  Recent Labs Lab 06/10/13 2010 06/10/13 2215 06/11/13 0430  TROPONINI 0.33* 0.37* <0.30   BNP (last 3 results) No results found for this basename: PROBNP,  in the last 8760 hours CBG:  Recent Labs  Lab 06/14/13 2244 06/14/13 2353 06/15/13 0757 06/15/13 1200 06/15/13 1658  GLUCAP 465* 368* 235* 324* 364*    Recent Results (from the past 240 hour(s))  URINE CULTURE     Status: None   Collection Time    06/10/13  1:56 PM      Result Value Range Status   Specimen Description URINE, CLEAN CATCH   Final   Special Requests NONE   Final   Culture  Setup Time     Final   Value: 06/10/2013 15:57     Performed at [facility]     Final   Value: 50,000 COLONIES/ML     Performed at [facility]   Culture     Final   Value: Multiple bacterial morphotypes present, none predominant. Suggest appropriate recollection if clinically indicated.     Performed at [facility]   Report Status 06/11/2013 FINAL   Final     Studies: Dg Chest 2 View  06/15/2013   CLINICAL DATA:  Post pacemaker placement.  EXAM: CHEST  2 VIEW  COMPARISON:  Portable examination 06/10/2013 and two-view chest x-ray 02/27/2011.  FINDINGS: Left subclavian dual  lead transvenous pacemaker with the lead tips projected over the expected location of the right atrial appendage and right ventricular apex. No evidence of pneumothorax or mediastinal hematoma. Prior sternotomy. Cardiac silhouette normal in size. Thoracic aorta mildly atherosclerotic that thoracic aorta atherosclerotic. Hilar and mediastinal contours otherwise unremarkable. Lungs clear. Small bilateral pleural effusions, right greater than left. Generalized osseous demineralization with multiple old healed right rib fractures.  IMPRESSION: 1. Left subclavian dual lead transvenous pacemaker appropriately positioned without acute complicating features. 2. Small bilateral pleural effusions, right greater than left. No acute cardiopulmonary disease otherwise.   Electronically Signed   By: Alexandra [name] M.D.   On: 06/15/2013 08:13    Scheduled Meds: . amLODipine  5 mg Oral Daily  . aspirin EC  81 mg Oral Daily  . atorvastatin  20 mg Oral Daily  . insulin aspart protamine- aspart  50 Units Subcutaneous BID WC  . magnesium oxide  400 mg Oral Daily  . nebivolol  20 mg Oral BID  . pantoprazole  40 mg Oral Daily  . sertraline  100 mg Oral Daily  . sodium chloride  3 mL Intravenous Q12H   Continuous Infusions:    Time spent: 25 min  Alexandra [name]  [facility] Pager [phone]. If 7PM-7AM, please contact night-coverage at [email or web address], password [facility] 06/15/2013, 6:36 PM  LOS: 5 days

## 2013-06-15 NOTE — Discharge Instructions (Signed)
° °  Supplemental Discharge Instructions for  Pacemaker/Defibrillator Patients  Activity No heavy lifting or vigorous activity with your left/right arm for 6 to 8 weeks.  Do not raise your left/right arm above your head for one week.  Gradually raise your affected arm as drawn below.                       011/12                          01/13                        01/14                     01/15            NO DRIVING for  6 months   ; you may begin driving on     December 13, 2013     . WOUND CARE   Keep the wound area clean and dry.  Do not get this area wet for one week. No showers for one week; you may shower on    01/16       .   The tape/steri-strips on your wound will fall off; do not pull them off.  No bandage is needed on the site.  DO  NOT apply any creams, oils, or ointments to the wound area.   If you notice any drainage or discharge from the wound, any swelling or bruising at the site, or you develop a fever > 101? F after you are discharged home, call the office at once.  Special Instructions   You are still able to use cellular telephones; use the ear opposite the side where you have your pacemaker/defibrillator.  Avoid carrying your cellular phone near your device.   When traveling through airports, show security personnel your identification card to avoid being screened in the metal detectors.  Ask the security personnel to use the hand wand.   Avoid arc welding equipment, MRI testing (magnetic resonance imaging), TENS units (transcutaneous nerve stimulators).  Call the office for questions about other devices.   Avoid electrical appliances that are in poor condition or are not properly grounded.   Microwave ovens are safe to be near or to operate.  Additional information for defibrillator patients should your device go off:   If your device goes off ONCE and you feel fine afterward, notify the device clinic nurses.   If your device goes off ONCE and you do not feel well  afterward, call 911.   If your device goes off TWICE, call 911.   If your device goes off THREE times in one day, call 911.  DO NOT DRIVE YOURSELF OR A FAMILY MEMBER WITH A DEFIBRILLATOR TO THE HOSPITAL--CALL 911.

## 2013-06-15 NOTE — Progress Notes (Signed)
SUBJECTIVE: The patient is doing well today.  At this time, she denies chest pain, shortness of breath, or any new concerns.  S/p cath yesterday demonstrating patent grafts. 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  Creat 1.23 today - 1.01 yesterday.   CURRENT MEDICATIONS: . amLODipine  5 mg Oral Daily  . aspirin EC  81 mg Oral Daily  . atorvastatin  20 mg Oral Daily  .  ceFAZolin (ANCEF) IV  1 g Intravenous Q6H  . insulin aspart protamine- aspart  26 Units Subcutaneous BID WC  . insulin detemir  15 Units Subcutaneous Once  . magnesium oxide  400 mg Oral Daily  . nebivolol  20 mg Oral BID  . pantoprazole  40 mg Oral Daily  . sertraline  100 mg Oral Daily  . sodium chloride  3 mL Intravenous Q12H      OBJECTIVE: Physical Exam: Filed Vitals:   06/14/13 1800 06/14/13 2000 06/15/13 0000 06/15/13 0400  BP: 111/57 132/59 137/65 157/60  Pulse:  78 68 62  Temp:  98.1 F (36.7 C) 97.8 F (36.6 C) 98.1 F (36.7 C)  TempSrc:      Resp:  20 18 18   Height:      Weight:      SpO2:  92% 91% 93%   Telemetry reveals sinus rhythm with intermittent atrial pacing  GEN- The patient is well appearing, alert and oriented x 3 today.   Head- normocephalic, atraumatic Eyes-  Sclera clear, conjunctiva pink Ears- hearing intact Oropharynx- clear Neck- supple, no JVP Lymph- no cervical lymphadenopathy Lungs- Clear to ausculation bilaterally, normal work of breathing Heart- Regular rate and rhythm, no murmurs, rubs or gallops, PMI not laterally displaced GI- soft, NT, ND, + BS Extremities- no clubbing, cyanosis, or edema Skin- no rash or lesion Psych- euthymic mood, full affect Neuro- strength and sensation are intact  LABS: Basic Metabolic Panel:  Recent Labs  06/13/13 0532 06/14/13 0700 06/15/13 0445  NA 139 141 137  K 4.0 3.8 4.6  CL 99 105 100  CO2 25 24 23   GLUCOSE 381* 203* 283*  BUN 16 16 21   CREATININE 1.08 1.01 1.23*  CALCIUM 9.2 9.3  8.7  MG 1.7 1.7  --    CBC:  Recent Labs  06/12/13 1021 06/14/13 0430  WBC 4.4 6.2  HGB 11.2* 12.7  HCT 32.1* 36.2  MCV 91.2 90.5  PLT 100* 133*    RADIOLOGY: CXR - leads in stable position; no obvious ptx - final read pending  ASSESSMENT AND PLAN:  Principal Problem:   Syncope vs seizure- brief CPR 06/10/12 Active Problems:   HTN (hypertension)   CAD - CABG x 3 in 2008.    PAF (paroxysmal atrial fibrillation)   Subdural hematoma 2012 (not a Coumadin candidate)   Hyperlipidemia   Prolonged QT interval- 527ms    LBBB (left bundle branch block)   Elevated troponin   Hypomagnesemia 1.4 on adm 06/10/12   DM (diabetes mellitus)   Acute renal insufficiency- SCr 1.5 on admission 06/10/12  1. Sudden cardiac arrest Doing well s/p ICD ICD interrogation is reviewed and normal CXR reveals stable leads and no pneumothorax  2. HTN Stable No change required today  3. Renal failure Slightly increased today,  Will repeat bmet tomorrow  4. CAD Stable No change required today  Wound check, arm mobility, shock plan reviewed with patient.  Patient is aware no driving for 6 months.  Routine follow up scheduled.  Anticipate discharge tomorrow am

## 2013-06-16 LAB — GLUCOSE, CAPILLARY
GLUCOSE-CAPILLARY: 205 mg/dL — AB (ref 70–99)
Glucose-Capillary: 176 mg/dL — ABNORMAL HIGH (ref 70–99)
Glucose-Capillary: 252 mg/dL — ABNORMAL HIGH (ref 70–99)
Glucose-Capillary: 232 mg/dL — ABNORMAL HIGH (ref 70–99)

## 2013-06-16 LAB — BASIC METABOLIC PANEL
BUN: 24 mg/dL — ABNORMAL HIGH (ref 6–23)
CHLORIDE: 101 meq/L (ref 96–112)
CO2: 26 mEq/L (ref 19–32)
CREATININE: 1.46 mg/dL — AB (ref 0.50–1.10)
Calcium: 9.2 mg/dL (ref 8.4–10.5)
GFR calc Af Amer: 40 mL/min — ABNORMAL LOW (ref >90)
GFR calc non Af Amer: 35 mL/min — ABNORMAL LOW (ref >90)
Glucose, Bld: 201 mg/dL — ABNORMAL HIGH (ref 70–99)
Potassium: 4.4 mEq/L (ref 3.7–5.3)
Sodium: 138 mEq/L (ref 137–147)

## 2013-06-16 MED ORDER — SODIUM CHLORIDE 0.9 % IV SOLN
INTRAVENOUS | Status: AC
  Administered 2013-06-16: 08:00:00 via INTRAVENOUS

## 2013-06-16 NOTE — Progress Notes (Signed)
SUBJECTIVE: The patient is doing well today.  At this time, she denies chest pain, shortness of breath, or any new concerns.  S/p cath yesterday demonstrating patent grafts. 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  Creat 1.4 today - 1.23 yesterday.   CURRENT MEDICATIONS: . amLODipine  5 mg Oral Daily  . aspirin EC  81 mg Oral Daily  . atorvastatin  20 mg Oral Daily  . insulin aspart protamine- aspart  50 Units Subcutaneous BID WC  . magnesium oxide  400 mg Oral Daily  . nebivolol  20 mg Oral BID  . pantoprazole  40 mg Oral Daily  . sertraline  100 mg Oral Daily  . sodium chloride  3 mL Intravenous Q12H   . sodium chloride      OBJECTIVE: Physical Exam: Filed Vitals:   06/15/13 1200 06/15/13 2000 06/16/13 0000 06/16/13 0400  BP: 155/74 155/69 149/68 145/63  Pulse: 70 71 67 72  Temp: 98.2 F (36.8 C) 99.4 F (37.4 C) 99.1 F (37.3 C) 98.4 F (36.9 C)  TempSrc: Oral Oral Oral Oral  Resp: 18 18 18 18   Height:      Weight:      SpO2: 94% 97% 96% 96%   Telemetry reveals sinus rhythm with intermittent atrial pacing  GEN- The patient is well appearing, alert and oriented x 3 today.   Head- normocephalic, atraumatic Eyes-  Sclera clear, conjunctiva pink Ears- hearing intact Oropharynx- clear Lungs- Clear to ausculation bilaterally, normal work of breathing Heart- Regular rate and rhythm, no murmurs, rubs or gallops, PMI not laterally displaced GI- soft, NT, ND, + BS Extremities- no clubbing, cyanosis, or edema ICD pocket with ecchymosis but no hematoma  LABS: Basic Metabolic Panel:  Recent Labs  06/14/13 0700 06/15/13 0445 06/16/13 0248  NA 141 137 138  K 3.8 4.6 4.4  CL 105 100 101  CO2 24 23 26   GLUCOSE 203* 283* 201*  BUN 16 21 24*  CREATININE 1.01 1.23* 1.46*  CALCIUM 9.3 8.7 9.2  MG 1.7  --   --    CBC:  Recent Labs  06/14/13 0430  WBC 6.2  HGB 12.7  HCT 36.2  MCV 90.5  PLT 133*    RADIOLOGY: CXR - leads  in stable position; no obvious ptx - final read pending  ASSESSMENT AND PLAN:  Principal Problem:   Syncope vs seizure- brief CPR 06/10/12 Active Problems:   HTN (hypertension)   CAD - CABG x 3 in 2008.    PAF (paroxysmal atrial fibrillation)   Subdural hematoma 2012 (not a Coumadin candidate)   Hyperlipidemia   Prolonged QT interval- 534ms    LBBB (left bundle branch block)   Elevated troponin   Hypomagnesemia 1.4 on adm 06/10/12   DM (diabetes mellitus)   Acute renal insufficiency- SCr 1.5 on admission 06/10/12  1. Sudden cardiac arrest Doing well s/p ICD No drigving x 6 months  2. HTN Stable Consider ace inhibitor once renal failure resolves  3. Renal failure Slightly increased today,  I would recommend  IV hydration today and repeat BMET tomorrow  4. CAD Stable No change required today  Given worsening renal function I would advise that we keep her in the hospital one more day for IV hydration and repeat BMET in am.  Once renal function is stable, she can discharge to home.   Wound check, arm mobility, shock plan reviewed with patient.  Patient is aware no driving for 6 months.  Routine follow up scheduled.

## 2013-06-16 NOTE — Progress Notes (Addendum)
[facility] PROGRESS NOTE  Alexandra [name] Alexandra [name] Alexandra [name] DOA: 06/10/2013 PCP: Stephens Shire, MD  Assessment/Plan: 1. Syncope: ICD placed 1/9. 2. Coronary artery disease: patent grafts 3. UTI: cx neg, treated. D/c abx 4. Hypertension.  5. DM uncontrolled: 70/30 increased 6. Depression:  zoloft 7. Creatinine up. On IVF and repeat BMET in am per Dr. Rayann Heman. Has h/o CKD 3 with creatinines ranging 1.0-1.5 in the past  Code Status: *Full code Family Communication: *none atbedside Disposition Plan: Home when stable   Consultants:  Neurology   Cardiology  ep   HPI/Subjective: Some soreness, otherwise no complaints.  Objective: Filed Vitals:   06/16/13 1400  BP: 145/67  Pulse: 64  Temp: 98.3 F (36.8 C)  Resp: 16    Intake/Output Summary (Last 24 hours) at 06/16/13 1537 Last data filed at 06/16/13 1300  Gross per 24 hour  Intake    603 ml  Output      0 ml  Net    603 ml   Filed Weights   06/12/13 1300  Weight: 84.324 kg (185 lb 14.4 oz)    Exam:   Lungs: CTA without WRR  Heart: RRRR without MGR Abdomen: BS normoactive. Soft, Nondistended, non-tender.  Extremities: No pretibial edema, no erythema Neuro: nonfocal  Data Reviewed: Echo Left ventricle: Septal and apical hypokinesis The cavity size was mildly dilated. Wall thickness was increased in a pattern of moderate LVH. Systolic function was normal. The estimated ejection fraction was in the range of 50% to 55%. - Mitral valve: Calcified annulus. Mild regurgitation. - Left atrium: The atrium was mildly dilated. - Atrial septum: No defect or patent foramen ovale was identified.  Basic Metabolic Panel:  Recent Labs Lab 06/10/13 1235 06/10/13 2010 06/11/13 0430 06/12/13 1021 06/13/13 0532 06/14/13 0700 06/15/13 0445 06/16/13 0248  NA 141  --  139  --  139 141 137 138  K 4.1  --  4.0  --  4.0 3.8 4.6 4.4  CL 104  --  102  --  99 105 100 101  CO2  --   --  26  --  25  24 23 26   GLUCOSE 118*  --  240*  --  381* 203* 283* 201*  BUN 28*  --  20  --  16 16 21  24*  CREATININE 1.50*  --  1.10  --  1.08 1.01 1.23* 1.46*  CALCIUM  --   --  8.6  --  9.2 9.3 8.7 9.2  MG  --  1.4*  --  1.3* 1.7 1.7  --   --   PHOS  --  3.6  --   --   --   --   --   --    Liver Function Tests: No results found for this basename: AST, ALT, ALKPHOS, BILITOT, PROT, ALBUMIN,  in the last 168 hours No results found for this basename: LIPASE, AMYLASE,  in the last 168 hours No results found for this basename: AMMONIA,  in the last 168 hours CBC:  Recent Labs Lab 06/10/13 1153 06/10/13 1235 06/11/13 0430 06/12/13 1021 06/14/13 0430  WBC 14.9*  --  5.7 4.4 6.2  NEUTROABS 7.7  --   --   --   --   HGB 12.8 13.6 10.7* 11.2* 12.7  HCT 37.3 40.0 30.7* 32.1* 36.2  MCV 94.9  --  91.6 91.2 90.5  PLT 198  --  124* 100* 133*   Cardiac Enzymes:  Recent Labs Lab 06/10/13 2010  06/10/13 2215 06/11/13 0430  TROPONINI 0.33* 0.37* <0.30   BNP (last 3 results) No results found for this basename: PROBNP,  in the last 8760 hours CBG:  Recent Labs Lab 06/15/13 1200 06/15/13 1658 06/15/13 2037 06/16/13 0820 06/16/13 1137  GLUCAP 324* 364* 330* 176* 252*    Recent Results (from the past 240 hour(s))  URINE CULTURE     Status: None   Collection Time    06/10/13  1:56 PM      Result Value Range Status   Specimen Description URINE, CLEAN CATCH   Final   Special Requests NONE   Final   Culture  Setup Time     Final   Value: 06/10/2013 15:57     Performed at [place]     Final   Value: 50,000 COLONIES/ML     Performed at [facility]   Culture     Final   Value: Multiple bacterial morphotypes present, none predominant. Suggest appropriate recollection if clinically indicated.     Performed at [facility]   Report Status 06/11/2013 FINAL   Final     Studies: Dg Chest 2 View  06/15/2013   CLINICAL DATA:  Post pacemaker placement.   EXAM: CHEST  2 VIEW  COMPARISON:  Portable examination 06/10/2013 and two-view chest x-ray 02/27/2011.  FINDINGS: Left subclavian dual lead transvenous pacemaker with the lead tips projected over the expected location of the right atrial appendage and right ventricular apex. No evidence of pneumothorax or mediastinal hematoma. Prior sternotomy. Cardiac silhouette normal in size. Thoracic aorta mildly atherosclerotic that thoracic aorta atherosclerotic. Hilar and mediastinal contours otherwise unremarkable. Lungs clear. Small bilateral pleural effusions, right greater than left. Generalized osseous demineralization with multiple old healed right rib fractures.  IMPRESSION: 1. Left subclavian dual lead transvenous pacemaker appropriately positioned without acute complicating features. 2. Small bilateral pleural effusions, right greater than left. No acute cardiopulmonary disease otherwise.   Electronically Signed   By: Evangeline Dakin M.D.   On: 06/15/2013 08:13    Scheduled Meds: . amLODipine  5 mg Oral Daily  . aspirin EC  81 mg Oral Daily  . atorvastatin  20 mg Oral Daily  . insulin aspart protamine- aspart  50 Units Subcutaneous BID WC  . magnesium oxide  400 mg Oral Daily  . nebivolol  20 mg Oral BID  . pantoprazole  40 mg Oral Daily  . sertraline  100 mg Oral Daily  . sodium chloride  3 mL Intravenous Q12H   Continuous Infusions: . sodium chloride 100 mL/hr at 06/16/13 0829    Time spent: 25 min  [name] L  [facility] Pager [phone]. If 7PM-7AM, please contact night-coverage at [email or web address], password [facility] 06/16/2013, 3:37 PM  LOS: 6 days

## 2013-06-16 NOTE — Progress Notes (Signed)
PT IV OOD today, ok to leave in per MD, pt for DC in am, redressed and cleaned, IV working well.

## 2013-06-17 DIAGNOSIS — E1129 Type 2 diabetes mellitus with other diabetic kidney complication: Secondary | ICD-10-CM

## 2013-06-17 DIAGNOSIS — E1165 Type 2 diabetes mellitus with hyperglycemia: Secondary | ICD-10-CM

## 2013-06-17 DIAGNOSIS — N183 Chronic kidney disease, stage 3 unspecified: Secondary | ICD-10-CM

## 2013-06-17 DIAGNOSIS — N058 Unspecified nephritic syndrome with other morphologic changes: Secondary | ICD-10-CM

## 2013-06-17 DIAGNOSIS — R7989 Other specified abnormal findings of blood chemistry: Secondary | ICD-10-CM

## 2013-06-17 DIAGNOSIS — I447 Left bundle-branch block, unspecified: Secondary | ICD-10-CM

## 2013-06-17 LAB — BASIC METABOLIC PANEL
BUN: 24 mg/dL — AB (ref 6–23)
CO2: 26 mEq/L (ref 19–32)
Calcium: 9.3 mg/dL (ref 8.4–10.5)
Chloride: 102 mEq/L (ref 96–112)
Creatinine, Ser: 1.14 mg/dL — ABNORMAL HIGH (ref 0.50–1.10)
GFR calc Af Amer: 54 mL/min — ABNORMAL LOW (ref >90)
GFR, EST NON AFRICAN AMERICAN: 47 mL/min — AB (ref >90)
GLUCOSE: 223 mg/dL — AB (ref 70–99)
Potassium: 4.7 mEq/L (ref 3.7–5.3)
Sodium: 138 mEq/L (ref 137–147)

## 2013-06-17 LAB — GLUCOSE, CAPILLARY
Glucose-Capillary: 193 mg/dL — ABNORMAL HIGH (ref 70–99)
Glucose-Capillary: 348 mg/dL — ABNORMAL HIGH (ref 70–99)

## 2013-06-17 MED ORDER — ACETAMINOPHEN 325 MG PO TABS: 325.0000 mg | ORAL_TABLET | ORAL | Status: DC | PRN

## 2013-06-17 MED ORDER — HYDROCODONE-ACETAMINOPHEN 5-325 MG PO TABS: 1.0000 | ORAL_TABLET | Freq: Four times a day (QID) | ORAL | Status: DC | PRN

## 2013-06-17 NOTE — Discharge Summary (Signed)
Physician Discharge Summary  Alexandra [name] TDV:[id number] DOB: 12-06-1940 DOA: 06/10/2013  PCP: Stephens Shire, MD  Admit date: 06/10/2013 Discharge date: 06/17/2013  Time spent: greater than 30 min  Discharge Diagnoses:  Principal Problem: Witnessed arrest/syncope, s/p CPR for about 1 minute Active Problems:   HTN (hypertension)   CAD - CABG x 3 in 2008.    PAF (paroxysmal atrial fibrillation)   Subdural hematoma 2012 (not a Coumadin candidate)   Hyperlipidemia   Prolonged QT interval- 541ms    LBBB (left bundle branch block)   Elevated troponin   Hypomagnesemia 1.4 on adm 06/10/12   DM (diabetes mellitus) CKD 3- SCr 1.5 on admission 06/10/12   Discharge Condition: stable  Filed Weights   06/12/13 1300  Weight: 84.324 kg (185 lb 14.4 oz)    History of present illness:  73 yo female with history of CAD s/p CABG (08'), HTN, HLD, DM who presented to [facility] ED after sustaining in hospital arrest. The patient was visiting a family member in the hospital when she had an apparent syncopal episode. Per family the patient was sitting in a chair, suddenly leaned to the left and lost consciousness. Code blue called and initially no palpable pulse found, CPR started and after several minutes of CPR pulse was noted, pt was breathing spontaneously but was agitated and TRH asked to admit for further evaluation. No reported concerns prior to this event. Pt is currently unable to provide history due to agitation and altered mental status.   Hospital Course:  Admitted to telemetry. Neurology, cardiolgy and EP consulted. EEG normal.  Echo showed septal and apical hypokinesis with fairly well preserved EF but myoview showed 1. 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 Associated distal septal paradoxical motion. 2. Mild peri-infarct ischemia along the proximal margins of the infarct cannot be excluded. 3. Left ventricular ejection fraction 40%.  Cardiac cath showed patent grafts.   Given sudden/witnessed arrest and syncope, ischemic CM, ICD was implanted.  Found to have UTI on UA on admission, culture negative. Has completed course of antibiotic.  During hospitalization, DM proved difficult to control, but HgbA1C not too elevated at 7.  WIll need outpatient f/u for DM optimization.  After cath and ICD implant, monitored for several days on IVF for increased creatinine. Does have stage 3 CKD.  By discharge, trending down.  Procedures: Cardiac catheterizaiton showed patent grafts  ICD implantation.  Consultations:  Neurology  Cardiology  EP  Discharge Exam: Filed Vitals:   06/17/13 0400  BP: 160/69  Pulse: 66  Temp: 97.7 F (36.5 C)  Resp: 18    General: comfortable Cardiovascular: RRR Respiratory: CTA Ext no CCE  Discharge Instructions  Discharge Orders   Future Appointments Provider Department Dept Phone   06/20/2013 11:15 AM Thayer Headings, MD [place] Office [phone]   06/24/2013 3:30 PM [facility] [place] [facility] [phone]   07/17/2013 9:15 AM Wallene Huh, [place] at [place]   Future Orders Complete By Expires   Diet - low sodium heart healthy  As directed    Diet Carb Modified  As directed    Driving Restrictions  As directed    Comments:     No driving for 6 months   Increase activity slowly  As directed        Medication List         acetaminophen 325 MG tablet  Commonly known as:  TYLENOL  Take 1-2 tablets (325-650 mg total) by mouth every 4 (four) hours as needed for mild pain.     amLODipine 5 MG tablet  Commonly known as:  NORVASC  Take 5 mg by mouth daily.     aspirin 81 MG tablet  Take 81 mg by mouth daily.     atorvastatin 20 MG tablet  Commonly known as:  LIPITOR  Take 20 mg by mouth daily.     ergocalciferol 50000 UNITS capsule  Commonly known as:  VITAMIN D2  Take 50,000 Units by mouth once a week.      HYDROcodone-acetaminophen 5-325 MG per tablet  Commonly known as:  NORCO/VICODIN  Take 1 tablet by mouth every 6 (six) hours as needed for moderate pain.     insulin lispro protamine-lispro (75-25) 100 UNIT/ML Susp injection  Commonly known as:  HUMALOG 75/25 MIX  Inject 26 Units into the skin 2 (two) times daily with a meal.     lisinopril 5 MG tablet  Commonly known as:  PRINIVIL,ZESTRIL  Take 5 mg by mouth daily.     multivitamin with minerals Tabs tablet  Take 1 tablet by mouth daily.     nebivolol 10 MG tablet  Commonly known as:  BYSTOLIC  Take 20 mg by mouth 2 (two) times daily.     pantoprazole 40 MG tablet  Commonly known as:  PROTONIX  Take 40 mg by mouth daily.     sertraline 100 MG tablet  Commonly known as:  ZOLOFT  Take 100 mg by mouth daily.       Allergies  Allergen Reactions  . Sulfa Antibiotics Rash       Follow-up Information   Follow up with Darden Amber., MD On 06/20/2013. (New phone number is [phone]. See MD at 11:15 am)    Specialty:  Cardiology   Contact information:   [place] [place] [address] [phone]       Follow up with [place] [facility] On 06/24/2013. (Wound check and device check at 3:30 pm)    Contact information:   [address] [place] [address]       Follow up with Virl Axe, MD. (See in 3 months, the office will call.)    Specialty:  Cardiology   Contact information:   [id number] [place] [address] [phone]       Follow up with Juanita Craver A, MD In 4 weeks.   Specialty:  Family Medicine   Contact information:   [id number] [place] [address] [phone]        The results of significant diagnostics from this hospitalization (including imaging, microbiology, ancillary and laboratory) are listed below for reference.    Significant Diagnostic Studies: Dg Chest 2 View  06/15/2013   CLINICAL DATA:  Post  pacemaker placement.  EXAM: CHEST  2 VIEW  COMPARISON:  Portable examination 06/10/2013 and two-view chest x-ray 02/27/2011.  FINDINGS: Left subclavian dual lead transvenous pacemaker with the lead tips projected over the expected location of the right atrial appendage and right ventricular apex. No evidence of pneumothorax or mediastinal hematoma. Prior sternotomy. Cardiac silhouette normal in size. Thoracic aorta mildly atherosclerotic that thoracic aorta atherosclerotic. Hilar and mediastinal contours otherwise unremarkable. Lungs clear. Small bilateral pleural effusions, right greater than left. Generalized osseous demineralization with multiple old healed right rib fractures.  IMPRESSION: 1. Left subclavian dual lead transvenous pacemaker appropriately positioned without acute  complicating features. 2. Small bilateral pleural effusions, right greater than left. No acute cardiopulmonary disease otherwise.   Electronically Signed   By: Hulan Saas M.D.   On: 06/15/2013 08:13   Ct Head Wo Contrast  06/10/2013   CLINICAL DATA:  Altered mental status.  EXAM: CT HEAD WITHOUT CONTRAST  TECHNIQUE: Contiguous axial images were obtained from the base of the skull through the vertex without intravenous contrast.  COMPARISON:  06/11/2011.  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 On the scout images, there is a wire projecting over the skull which is not visualized on the axial images. Mastoid air cells and paranasal sinuses are within normal limits. Intracranial atherosclerosis. No mass lesion, mass effect, midline shift, hydrocephalus, hemorrhage. No acute territorial cortical ischemia/infarct. Atrophy and chronic ischemic white matter disease is present.  IMPRESSION: Atrophy and chronic ischemic white matter disease without acute intracranial abnormality.   Electronically Signed   By: Andreas Newport M.D.   On: 06/10/2013 13:16   Nm Myocar Multi W/spect W/wall Motion / Ef  06/13/2013   CLINICAL DATA:  Chest pain.  History of hypertension and diabetes. Left bundle-branch block. History of breast cancer.  EXAM: MYOCARDIAL IMAGING WITH SPECT (REST AND PHARMACOLOGIC-STRESS)  GATED LEFT VENTRICULAR WALL MOTION STUDY  LEFT VENTRICULAR EJECTION FRACTION  TECHNIQUE: Standard myocardial SPECT imaging was performed after resting intravenous injection of 10 mCi Tc-5m sestamibi. Subsequently, intravenous infusion of Lexiscan was performed under the supervision of the Cardiology staff. At peak effect of the drug, 30 mCi Tc-70m sestamibi was injected intravenously and standard myocardial SPECT imaging was performed. Quantitative gated imaging was also performed to evaluate left ventricular wall motion, and estimate left ventricular ejection fraction.  COMPARISON:  DG CHEST 1V PORT dated 06/10/2013; CT CHEST W/O CM dated 03/25/2011  FINDINGS: SPECT images demonstrate a large infarct involving the apex and distal anterior and septal walls. There is possible minimal reversibility along the proximal margins of this infarct. The summed difference score is only 4.  Gated images were reviewed and demonstrate marked hypokinesis of the infarcted segments. There is aneurysmal dilatation and paradoxical motion of the distal septum. The QGS ejection fraction calculated at rest is 40% with an end-diastolic volume of 44ml and an end-systolic volume of 55ml.  IMPRESSION: 1. 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 Associated distal septal paradoxical motion. 2. Mild peri-infarct ischemia along the proximal margins of the infarct cannot be excluded. 3. Left ventricular ejection fraction 40%.   Electronically Signed   By: Roxy Horseman M.D.   On: 06/13/2013 13:26   Dg Chest Portable 1 View  06/10/2013   CLINICAL DATA:  Post arrest.  Diabetic hypertensive patient.  EXAM: PORTABLE CHEST - 1 VIEW  COMPARISON:  03/21/2011 chest x-ray.  FINDINGS: Post CABG.  Heart size top-normal.  Pulmonary vascular prominence most notable  centrally without frank pulmonary edema.  No segmental infiltrate or gross pneumothorax.  Remote right-sided rib fractures.  IMPRESSION: Post CABG. Heart size top-normal.  Pulmonary vascular prominence most notable centrally without frank pulmonary edema.  Remote right-sided rib fractures.   Electronically Signed   By: Bridgett Larsson M.D.   On: 06/10/2013 13:53   EKG 73 yo female with history of CAD s/p CABG (08'), HTN, HLD, DM who presented to [facility] ED after sustaining in hospital arrest. The patient was visiting a family member in the hospital when she had an apparent syncopal episode. Per family the patient was sitting in a chair, suddenly leaned to the left  and lost consciousness. Code blue called and initially no palpable pulse found, CPR started and after several minutes of CPR pulse was noted, pt was breathing spontaneously but was agitated and TRH asked to admit for further evaluation. No reported concerns prior to this event. Pt is currently unable to provide history due to agitation and altered mental status.   Echo Left ventricle: Septal and apical hypokinesis The cavity size was mildly dilated. Wall thickness was increased in a pattern of moderate LVH. Systolic function was normal. The estimated ejection fraction was in the range of 50% to 55%. - Mitral valve: Calcified annulus. Mild regurgitation. - Left atrium: The atrium was mildly dilated. - Atrial septum: No defect or patent foramen ovale was identified.  EEG Normal  Microbiology: Recent Results (from the past 240 hour(s))  URINE CULTURE     Status: None   Collection Time    06/10/13  1:56 PM      Result Value Range Status   Specimen Description URINE, CLEAN CATCH   Final   Special Requests NONE   Final   Culture  Setup Time     Final   Value: 06/10/2013 15:57     Performed at [facility]     Final   Value: 50,000 COLONIES/ML     Performed at [facility]   Culture     Final   Value:  Multiple bacterial morphotypes present, none predominant. Suggest appropriate recollection if clinically indicated.     Performed at [facility]   Report Status 06/11/2013 FINAL   Final     Labs: Basic Metabolic Panel:  Recent Labs Lab 06/10/13 1235 06/10/13 2010  06/12/13 1021 06/13/13 0532 06/14/13 0700 06/15/13 0445 06/16/13 0248 06/17/13 0620  NA 141  --   < >  --  139 141 137 138 138  K 4.1  --   < >  --  4.0 3.8 4.6 4.4 4.7  CL 104  --   < >  --  99 105 100 101 102  CO2  --   --   < >  --  25 24 23 26 26   GLUCOSE 118*  --   < >  --  381* 203* 283* 201* 223*  BUN 28*  --   < >  --  16 16 21  24* 24*  CREATININE 1.50*  --   < >  --  1.08 1.01 1.23* 1.46* 1.14*  CALCIUM  --   --   < >  --  9.2 9.3 8.7 9.2 9.3  MG  --  1.4*  --  1.3* 1.7 1.7  --   --   --   PHOS  --  3.6  --   --   --   --   --   --   --   < > = values in this interval not displayed. Liver Function Tests: No results found for this basename: AST, ALT, ALKPHOS, BILITOT, PROT, ALBUMIN,  in the last 168 hours No results found for this basename: LIPASE, AMYLASE,  in the last 168 hours No results found for this basename: AMMONIA,  in the last 168 hours CBC:  Recent Labs Lab 06/10/13 1153 06/10/13 1235 06/11/13 0430 06/12/13 1021 06/14/13 0430  WBC 14.9*  --  5.7 4.4 6.2  NEUTROABS 7.7  --   --   --   --   HGB 12.8 13.6 10.7* 11.2* 12.7  HCT 37.3 40.0  30.7* 32.1* 36.2  MCV 94.9  --  91.6 91.2 90.5  PLT 198  --  124* 100* 133*   Cardiac Enzymes:  Recent Labs Lab 06/10/13 2010 06/10/13 2215 06/11/13 0430  TROPONINI 0.33* 0.37* <0.30   BNP: BNP (last 3 results) No results found for this basename: PROBNP,  in the last 8760 hours CBG:  Recent Labs Lab 06/16/13 0820 06/16/13 1137 06/16/13 1645 06/16/13 2223 06/17/13 0751  GLUCAP 176* 252* 232* 205* 193*       Signed:  Natelie Ostrosky L  [facility] 06/17/2013, 8:24 AM

## 2013-06-17 NOTE — Progress Notes (Signed)
Discharge instructions and prescriptions given.  No questions asked, verbalized understanding.  Left via wheelchair to sisters room who is still in hospital to wait for her ride.  Lorrene Reid

## 2013-06-19 ENCOUNTER — Ambulatory Visit: Payer: PRIVATE HEALTH INSURANCE | Admitting: Cardiovascular Disease

## 2013-06-20 ENCOUNTER — Ambulatory Visit: Payer: PRIVATE HEALTH INSURANCE | Admitting: Cardiovascular Disease

## 2013-06-24 ENCOUNTER — Ambulatory Visit (INDEPENDENT_AMBULATORY_CARE_PROVIDER_SITE_OTHER): Payer: PRIVATE HEALTH INSURANCE | Admitting: *Deleted

## 2013-06-24 DIAGNOSIS — R9431 Abnormal electrocardiogram [ECG] [EKG]: Secondary | ICD-10-CM

## 2013-06-24 LAB — MDC_IDC_ENUM_SESS_TYPE_INCLINIC
Date Time Interrogation Session: 20150119155339
HIGH POWER IMPEDANCE MEASURED VALUE: 41.625
Lead Channel Impedance Value: 550 Ohm
Lead Channel Pacing Threshold Amplitude: 0.5 V
Lead Channel Pacing Threshold Amplitude: 0.75 V
Lead Channel Pacing Threshold Pulse Width: 0.5 ms
Lead Channel Sensing Intrinsic Amplitude: 11.8 mV
Lead Channel Sensing Intrinsic Amplitude: 3 mV
Lead Channel Setting Pacing Amplitude: 3.5 V
Lead Channel Setting Pacing Amplitude: 3.5 V
Lead Channel Setting Pacing Pulse Width: 0.5 ms
Lead Channel Setting Sensing Sensitivity: 0.5 mV
MDC IDC MSMT BATTERY REMAINING LONGEVITY: 105.6 mo
MDC IDC MSMT LEADCHNL RA IMPEDANCE VALUE: 400 Ohm
MDC IDC MSMT LEADCHNL RV PACING THRESHOLD PULSEWIDTH: 0.5 ms
MDC IDC PG MODEL: 2357
MDC IDC PG SERIAL: 7150224
MDC IDC SET ZONE DETECTION INTERVAL: 300 ms
MDC IDC SET ZONE DETECTION INTERVAL: 350 ms
MDC IDC STAT BRADY RA PERCENT PACED: 3.8 %
MDC IDC STAT BRADY RV PERCENT PACED: 0 %
Zone Setting Detection Interval: 250 ms

## 2013-06-24 NOTE — Progress Notes (Signed)
Wound check appointment. Steri-strips removed. Wound without redness, resolving edema and bruising. Incision edges approximated, wound well healed. Normal device function. Thresholds, sensing, and impedances consistent with implant measurements. Device programmed at 3.5V for extra safety margin until 3 month visit. Histogram distribution appropriate for patient and level of activity. No mode switches or ventricular arrhythmias noted. Patient educated about wound care, arm mobility, lifting restrictions, shock plan. ROV in 3 months with implanting physician.

## 2013-07-01 ENCOUNTER — Encounter: Payer: Self-pay | Admitting: Physician Assistant

## 2013-07-01 ENCOUNTER — Ambulatory Visit (INDEPENDENT_AMBULATORY_CARE_PROVIDER_SITE_OTHER): Payer: PRIVATE HEALTH INSURANCE | Admitting: Physician Assistant

## 2013-07-01 VITALS — BP 150/84 | HR 63 | Ht 63.0 in | Wt 187.0 lb

## 2013-07-01 DIAGNOSIS — N179 Acute kidney failure, unspecified: Secondary | ICD-10-CM

## 2013-07-01 DIAGNOSIS — R0989 Other specified symptoms and signs involving the circulatory and respiratory systems: Secondary | ICD-10-CM

## 2013-07-01 DIAGNOSIS — I2589 Other forms of chronic ischemic heart disease: Secondary | ICD-10-CM

## 2013-07-01 DIAGNOSIS — I255 Ischemic cardiomyopathy: Secondary | ICD-10-CM | POA: Insufficient documentation

## 2013-07-01 DIAGNOSIS — Z95810 Presence of automatic (implantable) cardiac defibrillator: Secondary | ICD-10-CM | POA: Insufficient documentation

## 2013-07-01 DIAGNOSIS — Z8674 Personal history of sudden cardiac arrest: Secondary | ICD-10-CM

## 2013-07-01 DIAGNOSIS — E785 Hyperlipidemia, unspecified: Secondary | ICD-10-CM

## 2013-07-01 DIAGNOSIS — I4891 Unspecified atrial fibrillation: Secondary | ICD-10-CM

## 2013-07-01 DIAGNOSIS — I10 Essential (primary) hypertension: Secondary | ICD-10-CM

## 2013-07-01 DIAGNOSIS — I2510 Atherosclerotic heart disease of native coronary artery without angina pectoris: Secondary | ICD-10-CM

## 2013-07-01 MED ORDER — LISINOPRIL 10 MG PO TABS: 10.0000 mg | ORAL_TABLET | Freq: Every day | ORAL | Status: DC

## 2013-07-01 NOTE — Patient Instructions (Addendum)
Your physician wants you to follow-up in: 2 MONTHS WITH DR. ALLRED.  You will receive a reminder letter in the mail two months in advance. If you don't receive a letter, please call our office to schedule the follow-up appointment.   Your physician wants you to follow-up in: [place]. Acie Fredrickson. You will receive a reminder letter in the mail two months in advance. If you don't receive a letter, please call our office to schedule the follow-up appointment.   Your physician has requested that you have a carotid duplex. This test is an ultrasound of the carotid arteries in your neck. It looks at blood flow through these arteries that supply the brain with blood. Allow one hour for this exam. There are no restrictions or special instructions.  LAB WORK TO BE DONE 07/08/13 BETWEEN THE HOURS OF 7:30 AND 4:30

## 2013-07-01 NOTE — Progress Notes (Addendum)
[facility] [place], [place]  [address] Phone: [phone] Fax:  [phone]  Date:  07/01/2013   ID:  Alexandra [name], DOB Sep 26, 1940, MRN [id number]  PCP:  Stephens Shire, MD  Cardiologist:  Dr. Liam Rogers   Electrophysiologist:  Dr. Thompson Grayer    History of Present Illness: Alexandra [name] is a 73 y.o. female with a hx of CAD, s/p CABG, AFib, s/p fall with SDH treated with evacuation craniotomy c/b seizures (not on coumadin), DM, HTN, HL, breast CA.  Last seen by Dr. Liam Rogers in 07/2012.    She was recently admitted 1/5-1/12 with aborted sudden cardiac death felt to be 2/2 arrhythmic cause. She was visiting a family member at [facility] when she passed out.  She had no pulse and CPR started.  She had return of breathing after one minute.  She was seen by neurology.  EEG was normal.  Head CT was not acute.  QTc was long (520).  She was seen by cardiology.  Echo (06/2013):  Mod LVH, EF 50-55%, MAC, mild MR, mild LAE.  Myoview (06/2013):  Large infarct in apex and dAnt and sept wall, mild peri-infarct ischemia, EF 40%.  LHC (06/2013):  pLAD 50 then 40, dOM occluded, pRCA 50, mRCA 95, dRCA occluded, RV marginal 70, L-LAD ok, S-dCFX ok, S-RCA ok; EF 45% with AL HK.  She was seen by EP.  No ischemic etiology was detected and she was felt to meet criteria for ICD implant for secondary prevention.  She had a [employer] ICD implanted 06/14/13.  She had evidence of AKI with increased creatinine post procedure.  Creatinine did improved at d/c.  She has been seen back for wound check with EP clinic already.  She is here for routine follow up.  Her ICD site is still sore. She denies any dyspnea, syncope, orthopnea, PND.  She has chronic LE edema without change.  No ICD Rx.    Recent Labs: 06/10/2013: TSH 2.297  06/14/2013: Hemoglobin 12.7  06/17/2013: Creatinine 1.14*; Potassium 4.7   Wt Readings from Last 3 Encounters:  06/12/13 185 lb 14.4 oz (84.324 kg)  06/12/13 185 lb 14.4 oz  (84.324 kg)  06/12/13 185 lb 14.4 oz (84.324 kg)     Past Medical History  Diagnosis Date  . HTN (hypertension)   . CAD (coronary artery disease)     a. Remote CABG in 2008 in [place].  . DM (diabetes mellitus)   . HTN (hypertension)   . PAF (paroxysmal atrial fibrillation)     a. Not on Coumadin due to history of falls and SDH  . GERD (gastroesophageal reflux disease)   . Venous insufficiency   . Left bundle branch block   . Obesity   . Chronic anticoagulation   . Breast cancer   . Seizure     a. 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    Current Outpatient Prescriptions  Medication Sig Dispense Refill  . acetaminophen (TYLENOL) 325 MG tablet Take 1-2 tablets (325-650 mg total) by mouth every 4 (four) hours as needed for mild pain.      Marland Kitchen amLODipine (NORVASC) 5 MG tablet Take 5 mg by mouth daily.      Marland Kitchen aspirin 81 MG tablet Take 81 mg by mouth daily.      Marland Kitchen atorvastatin (LIPITOR) 20 MG tablet Take 20 mg by mouth daily.      . ergocalciferol (VITAMIN D2) 50000 UNITS capsule Take 50,000 Units by  mouth once a week.      Marland Kitchen HYDROcodone-acetaminophen (NORCO/VICODIN) 5-325 MG per tablet Take 1 tablet by mouth every 6 (six) hours as needed for moderate pain.  30 tablet  0  . insulin lispro protamine-lispro (HUMALOG 75/25 MIX) (75-25) 100 UNIT/ML SUSP injection Inject 26 Units into the skin 2 (two) times daily with a meal.      . lisinopril (PRINIVIL,ZESTRIL) 5 MG tablet Take 5 mg by mouth daily.      . Multiple Vitamin (MULTIVITAMIN WITH MINERALS) TABS tablet Take 1 tablet by mouth daily.      . nebivolol (BYSTOLIC) 10 MG tablet Take 20 mg by mouth 2 (two) times daily.      . pantoprazole (PROTONIX) 40 MG tablet Take 40 mg by mouth daily.      . sertraline (ZOLOFT) 100 MG tablet Take 100 mg by mouth daily.         No current facility-administered medications for this visit.    Allergies:   Sulfa antibiotics   Social History:  The patient  reports that she has never smoked.  She has never used smokeless tobacco. She reports that she does not drink alcohol or use illicit drugs.   Family History:  The patient's family history includes Diabetes in her brother and mother; Emphysema in her father; Hypertension in her father and mother; Stroke in her mother.   ROS:  Please see the history of present illness.   No fevers.   All other systems reviewed and negative.   PHYSICAL EXAM: VS:  BP 150/84  Pulse 63  Ht 5\' 3"  (1.6 m)  Wt 187 lb (84.823 kg)  BMI 33.13 kg/m2 Well nourished, well developed, in no acute distress HEENT: normal Neck: no JVD Vascular:  + L carotid bruit Cardiac:  normal S1, S2; RRR; 2/6 systolic murmur along LSB Chest:  ICD prominent - had Dr. Greggory Brandy look at site (?hematoma) - looks similar to implant (pt with increased fibrotic tissue from breast radiation for CA) Lungs:  clear to auscultation bilaterally, no wheezing, rhonchi or rales Abd: soft, nontender, no hepatomegaly Ext: trace bilateral LE edema with chronic changes; right groin without hematoma or bruit  Skin: warm and dry Neuro:  CNs 2-12 intact, no focal abnormalities noted  EKG:  NSR, HR 63, LBBB     ASSESSMENT AND PLAN:  1. Aborted Sudden Cardiac Death, s/p AICD:  As noted, her site was observed by Dr. Thompson Grayer today and it looks to be healing well.  She will follow up with EP as planned. 2. CAD:   Patent bypass grafts by recent LHC.  Continue ASA, statin. 3. Ischemic Cardiomyopathy:  She has scar on nuclear study and wall motion abnormality on LV gram at [facility].  EF is somewhat variable between nuclear study/cath and echo.  Continue beta blocker, ACEI.  Consider spironolactone in future.   4. Paroxysmal Atrial Fibrillation:  She is maintaining NSR.  She is not a coumadin candidate 2/2 prior SDH from a fall.  5. Hypertension:  BP above target.  Will increase Lisinopril to 10 mg QD.  Get recent BMET from endocrinology and repeat BMET in 1 week. 6. Hyperlipidemia:  Continue  statin. 7. Carotid Bruit:  Arrange carotid US for L bruit. 8. Diabetes Mellitus:  F/u with primary care/endocrinology. 9. Acute Kidney Injury:  Improved at d/c.  Get labs from endocrine as noted (patient states kidneys were normal at lab test last week).  Review of records indicates patient's creatinine ranges 0.9-1.5  over the last several years. 10. Disposition:  F/u with Dr. Thompson Grayer as planned post implant and Dr. Liam Rogers in 4-5 months.   Signed, Richardson Dopp, PA-C  07/01/2013 8:24 AM    Addendum: Labs from Endocrinology (06/24/2013):  K 4.3, creatinine 1.43 Richardson Dopp, [place]   07/01/2013 9:51 AM

## 2013-07-04 ENCOUNTER — Encounter: Payer: Self-pay | Admitting: Internal Medicine

## 2013-07-08 ENCOUNTER — Encounter: Payer: Self-pay | Admitting: Physician Assistant

## 2013-07-08 ENCOUNTER — Telehealth: Payer: Self-pay | Admitting: *Deleted

## 2013-07-08 ENCOUNTER — Other Ambulatory Visit (INDEPENDENT_AMBULATORY_CARE_PROVIDER_SITE_OTHER): Payer: PRIVATE HEALTH INSURANCE

## 2013-07-08 ENCOUNTER — Ambulatory Visit (HOSPITAL_COMMUNITY): Payer: PRIVATE HEALTH INSURANCE | Attending: Physician Assistant

## 2013-07-08 DIAGNOSIS — I658 Occlusion and stenosis of other precerebral arteries: Secondary | ICD-10-CM | POA: Insufficient documentation

## 2013-07-08 DIAGNOSIS — I4891 Unspecified atrial fibrillation: Secondary | ICD-10-CM

## 2013-07-08 DIAGNOSIS — I10 Essential (primary) hypertension: Secondary | ICD-10-CM | POA: Insufficient documentation

## 2013-07-08 DIAGNOSIS — R0989 Other specified symptoms and signs involving the circulatory and respiratory systems: Secondary | ICD-10-CM

## 2013-07-08 DIAGNOSIS — Z951 Presence of aortocoronary bypass graft: Secondary | ICD-10-CM | POA: Insufficient documentation

## 2013-07-08 DIAGNOSIS — I2510 Atherosclerotic heart disease of native coronary artery without angina pectoris: Secondary | ICD-10-CM | POA: Insufficient documentation

## 2013-07-08 DIAGNOSIS — I6529 Occlusion and stenosis of unspecified carotid artery: Secondary | ICD-10-CM | POA: Insufficient documentation

## 2013-07-08 DIAGNOSIS — E119 Type 2 diabetes mellitus without complications: Secondary | ICD-10-CM | POA: Insufficient documentation

## 2013-07-08 DIAGNOSIS — E785 Hyperlipidemia, unspecified: Secondary | ICD-10-CM | POA: Insufficient documentation

## 2013-07-08 LAB — BASIC METABOLIC PANEL
BUN: 18 mg/dL (ref 6–23)
CALCIUM: 9.2 mg/dL (ref 8.4–10.5)
CO2: 26 mEq/L (ref 19–32)
Chloride: 104 mEq/L (ref 96–112)
Creatinine, Ser: 1.2 mg/dL (ref 0.4–1.2)
GFR: 46.85 mL/min — AB (ref >60.00)
Glucose, Bld: 182 mg/dL — ABNORMAL HIGH (ref 70–99)
Potassium: 4 mEq/L (ref 3.5–5.1)
Sodium: 138 mEq/L (ref 135–145)

## 2013-07-08 NOTE — Telephone Encounter (Signed)
pt notified about carotid results with verbal understanding to Plan of Care

## 2013-07-09 ENCOUNTER — Telehealth: Payer: Self-pay | Admitting: *Deleted

## 2013-07-09 NOTE — Telephone Encounter (Signed)
pt notified about lab results with verbal understanding  

## 2013-07-17 ENCOUNTER — Ambulatory Visit (INDEPENDENT_AMBULATORY_CARE_PROVIDER_SITE_OTHER): Payer: Medicare Other | Admitting: Podiatry

## 2013-07-17 ENCOUNTER — Encounter: Payer: Self-pay | Admitting: Podiatry

## 2013-07-17 VITALS — BP 149/59 | HR 71 | Resp 16

## 2013-07-17 DIAGNOSIS — M79609 Pain in unspecified limb: Secondary | ICD-10-CM

## 2013-07-17 DIAGNOSIS — B351 Tinea unguium: Secondary | ICD-10-CM

## 2013-07-17 NOTE — Progress Notes (Signed)
Subjective:     Patient ID: Alexandra [name], female   DOB: 09-Jul-1940, 73 y.o.   MRN: [id number]  HPI patient has pain in her nails that are thick and she is unable to cut   Review of Systems     Objective:   Physical Exam Neurovascular status intact with painful thick nail bed 1-5 both feet    Assessment:     Mycotic nail infection with pain 1-5 both feet    Plan:     Debridement painful nail bed 1-5 both feet no bleeding noted

## 2013-07-31 ENCOUNTER — Other Ambulatory Visit: Payer: Self-pay

## 2013-07-31 DIAGNOSIS — I4891 Unspecified atrial fibrillation: Secondary | ICD-10-CM

## 2013-07-31 MED ORDER — LISINOPRIL 10 MG PO TABS: 10.0000 mg | ORAL_TABLET | Freq: Every day | ORAL | Status: DC

## 2013-08-03 ENCOUNTER — Emergency Department (HOSPITAL_COMMUNITY): Payer: PRIVATE HEALTH INSURANCE

## 2013-08-03 ENCOUNTER — Encounter (HOSPITAL_COMMUNITY): Payer: Self-pay | Admitting: Emergency Medicine

## 2013-08-03 ENCOUNTER — Emergency Department (HOSPITAL_COMMUNITY)
Admission: EM | Admit: 2013-08-03 | Discharge: 2013-08-03 | Disposition: A | Discharge status: Home / Self Care | Payer: PRIVATE HEALTH INSURANCE | Attending: Emergency Medicine | Admitting: Emergency Medicine

## 2013-08-03 DIAGNOSIS — E119 Type 2 diabetes mellitus without complications: Secondary | ICD-10-CM | POA: Insufficient documentation

## 2013-08-03 DIAGNOSIS — X58XXXA Exposure to other specified factors, initial encounter: Secondary | ICD-10-CM | POA: Insufficient documentation

## 2013-08-03 DIAGNOSIS — Z794 Long term (current) use of insulin: Secondary | ICD-10-CM | POA: Insufficient documentation

## 2013-08-03 DIAGNOSIS — N390 Urinary tract infection, site not specified: Secondary | ICD-10-CM | POA: Insufficient documentation

## 2013-08-03 DIAGNOSIS — Z951 Presence of aortocoronary bypass graft: Secondary | ICD-10-CM | POA: Insufficient documentation

## 2013-08-03 DIAGNOSIS — Y929 Unspecified place or not applicable: Secondary | ICD-10-CM | POA: Insufficient documentation

## 2013-08-03 DIAGNOSIS — S1093XA Contusion of unspecified part of neck, initial encounter: Secondary | ICD-10-CM

## 2013-08-03 DIAGNOSIS — Z8669 Personal history of other diseases of the nervous system and sense organs: Secondary | ICD-10-CM | POA: Insufficient documentation

## 2013-08-03 DIAGNOSIS — S0003XA Contusion of scalp, initial encounter: Secondary | ICD-10-CM | POA: Insufficient documentation

## 2013-08-03 DIAGNOSIS — I2510 Atherosclerotic heart disease of native coronary artery without angina pectoris: Secondary | ICD-10-CM | POA: Insufficient documentation

## 2013-08-03 DIAGNOSIS — Z95810 Presence of automatic (implantable) cardiac defibrillator: Secondary | ICD-10-CM | POA: Insufficient documentation

## 2013-08-03 DIAGNOSIS — I10 Essential (primary) hypertension: Secondary | ICD-10-CM | POA: Insufficient documentation

## 2013-08-03 DIAGNOSIS — Z7982 Long term (current) use of aspirin: Secondary | ICD-10-CM | POA: Insufficient documentation

## 2013-08-03 DIAGNOSIS — E669 Obesity, unspecified: Secondary | ICD-10-CM | POA: Insufficient documentation

## 2013-08-03 DIAGNOSIS — R531 Weakness: Secondary | ICD-10-CM

## 2013-08-03 DIAGNOSIS — K219 Gastro-esophageal reflux disease without esophagitis: Secondary | ICD-10-CM | POA: Insufficient documentation

## 2013-08-03 DIAGNOSIS — Z853 Personal history of malignant neoplasm of breast: Secondary | ICD-10-CM | POA: Insufficient documentation

## 2013-08-03 DIAGNOSIS — Z79899 Other long term (current) drug therapy: Secondary | ICD-10-CM | POA: Insufficient documentation

## 2013-08-03 DIAGNOSIS — Z923 Personal history of irradiation: Secondary | ICD-10-CM | POA: Insufficient documentation

## 2013-08-03 DIAGNOSIS — IMO0002 Reserved for concepts with insufficient information to code with codable children: Secondary | ICD-10-CM | POA: Insufficient documentation

## 2013-08-03 DIAGNOSIS — S0083XA Contusion of other part of head, initial encounter: Secondary | ICD-10-CM

## 2013-08-03 DIAGNOSIS — R5383 Other fatigue: Principal | ICD-10-CM

## 2013-08-03 DIAGNOSIS — R5381 Other malaise: Secondary | ICD-10-CM | POA: Insufficient documentation

## 2013-08-03 DIAGNOSIS — Y939 Activity, unspecified: Secondary | ICD-10-CM | POA: Insufficient documentation

## 2013-08-03 LAB — I-STAT CHEM 8, ED
BUN: 25 mg/dL — ABNORMAL HIGH (ref 6–23)
Calcium, Ion: 1.2 mmol/L (ref 1.13–1.30)
Chloride: 100 mEq/L (ref 96–112)
Creatinine, Ser: 1.6 mg/dL — ABNORMAL HIGH (ref 0.50–1.10)
Glucose, Bld: 272 mg/dL — ABNORMAL HIGH (ref 70–99)
HCT: 33 % — ABNORMAL LOW (ref 36.0–46.0)
Hemoglobin: 11.2 g/dL — ABNORMAL LOW (ref 12.0–15.0)
Potassium: 3.8 mEq/L (ref 3.7–5.3)
SODIUM: 139 meq/L (ref 137–147)
TCO2: 23 mmol/L (ref 0–100)

## 2013-08-03 LAB — URINALYSIS, ROUTINE W REFLEX MICROSCOPIC
Bilirubin Urine: NEGATIVE
GLUCOSE, UA: 250 mg/dL — AB
Ketones, ur: NEGATIVE mg/dL
Nitrite: NEGATIVE
Protein, ur: 30 mg/dL — AB
SPECIFIC GRAVITY, URINE: 1.017 (ref 1.005–1.030)
UROBILINOGEN UA: 0.2 mg/dL (ref 0.0–1.0)
pH: 5 (ref 5.0–8.0)

## 2013-08-03 LAB — COMPREHENSIVE METABOLIC PANEL
ALBUMIN: 3.5 g/dL (ref 3.5–5.2)
ALT: 16 U/L (ref 0–35)
AST: 19 U/L (ref 0–37)
Alkaline Phosphatase: 86 U/L (ref 39–117)
BILIRUBIN TOTAL: 0.2 mg/dL — AB (ref 0.3–1.2)
BUN: 24 mg/dL — ABNORMAL HIGH (ref 6–23)
CALCIUM: 9.5 mg/dL (ref 8.4–10.5)
CHLORIDE: 98 meq/L (ref 96–112)
CO2: 23 meq/L (ref 19–32)
Creatinine, Ser: 1.4 mg/dL — ABNORMAL HIGH (ref 0.50–1.10)
GFR calc Af Amer: 42 mL/min — ABNORMAL LOW (ref >90)
GFR, EST NON AFRICAN AMERICAN: 37 mL/min — AB (ref >90)
Glucose, Bld: 271 mg/dL — ABNORMAL HIGH (ref 70–99)
Potassium: 4 mEq/L (ref 3.7–5.3)
Sodium: 139 mEq/L (ref 137–147)
Total Protein: 6.9 g/dL (ref 6.0–8.3)

## 2013-08-03 LAB — CBC
HCT: 33 % — ABNORMAL LOW (ref 36.0–46.0)
Hemoglobin: 11.6 g/dL — ABNORMAL LOW (ref 12.0–15.0)
MCH: 31.8 pg (ref 26.0–34.0)
MCHC: 35.2 g/dL (ref 30.0–36.0)
MCV: 90.4 fL (ref 78.0–100.0)
PLATELETS: 126 10*3/uL — AB (ref 150–400)
RBC: 3.65 MIL/uL — AB (ref 3.87–5.11)
RDW: 12.3 % (ref 11.5–15.5)
WBC: 8.9 10*3/uL (ref 4.0–10.5)

## 2013-08-03 LAB — URINE MICROSCOPIC-ADD ON

## 2013-08-03 MED ORDER — SODIUM CHLORIDE 0.9 % IV BOLUS (SEPSIS)
1000.0000 mL | Freq: Once | INTRAVENOUS | Status: AC
  Administered 2013-08-03: 1000 mL via INTRAVENOUS

## 2013-08-03 MED ORDER — CEPHALEXIN 500 MG PO CAPS: 500.0000 mg | ORAL_CAPSULE | Freq: Four times a day (QID) | ORAL | Status: DC

## 2013-08-03 NOTE — ED Notes (Signed)
Pt provided coffee for discharge. Ambulatory to discharge with family members.

## 2013-08-03 NOTE — ED Provider Notes (Signed)
CSN: [id number]     Arrival date & time 08/03/13  0319 History   First MD Initiated Contact with Patient 08/03/13 [phone]     Chief Complaint  Patient presents with  . Weakness     (Consider location/radiation/quality/duration/timing/severity/associated sxs/prior Treatment) HPI  73 year old female with multiple comorbidities including hypertension, CAD, insulin-dependent diabetes,  atrial fibrillation not on anticoagulant, who presents for evaluation of generalized weakness. Patient reports she has been having trouble controlling her blood sugar for the past several weeks. States sometimes at night she will wake up feeling weak and check her blood sugar and it has been as low in the 40s. Last night she was feeling fine. She woke up this morning feeling weak and, check her blood sugar and noticed that it was 102. She ate some pineapple and felt better. She then noticed some mild tenderness to her scalp and upon checking it there was a bruise to the back of her head. She denies injuring it in any way. She then contacted EMS to bring her to the ER for further evaluation of her bruising. Currently her weakness has resolved. Patient denies fever, chills, headache, double vision, slurring of speech, focal weakness, chest pain, shortness of breath, abdominal pain, dysuria, numbness or tingling sensation. She has a heart defibrillator. Denies defib firing off, or having heart palpitation.  No dizziness/lightheadedness    Past Medical History  Diagnosis Date  . HTN (hypertension)   . CAD (coronary artery disease)     a. Remote CABG in 2008 in [place].;  b. LHC (06/2013):  pLAD 50 then 40, dOM occluded, pRCA 50, mRCA 95, dRCA occluded, RV marginal 70, L-LAD ok, S-dCFX ok, S-RCA ok; EF 45% with AL HK  . DM (diabetes mellitus)   . HTN (hypertension)   . PAF (paroxysmal atrial fibrillation)     a. Not on Coumadin due to history of falls and SDH  . GERD (gastroesophageal reflux disease)   . Venous insufficiency    . Left bundle branch block   . Obesity   . Breast cancer     s/p radiation  . Seizure     a. 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  Marland Kitchen Hx of echocardiogram     a. Echo (06/2013):  Mod LVH, EF 50-55%, MAC, mild MR, mild LAE  . Ischemic cardiomyopathy     Myoview (06/2013):  Large infarct in apex and dAnt and sept wall, mild peri-infarct ischemia, EF 40%  . H/O sudden cardiac death successfully resuscitated     probably arrhythmic cause => ICD implanted  . S/P implantation of automatic cardioverter/defibrillator (AICD)     St Jude; Dr. Rayann Heman  . Carotid stenosis     Carotid US (07/2013):  Bilat ICA 1-39% (repeat 1 year).   Past Surgical History  Procedure Laterality Date  . Partial hysterectomy      1985  . Total abdominal hysterectomy  1986  . Coronary artery bypass graft  2008    x3 2008.  [place] (Dr. Wende Neighbors) [facility] [phone]  . Cholecystectomy    . Subdural hematoma evacuation via craniotomy  2012    bil x 2  . Implantable cardioverter defibrillator implant  Jan 2015     [place] DR model [id number] (serial Number [id number]) ICD   Family History  Problem Relation Age of Onset  . Emphysema Father   . Hypertension Father   . Hypertension Mother   . Stroke Mother   .  Diabetes Mother   . Diabetes Brother    History  Substance Use Topics  . Smoking status: Never Smoker   . Smokeless tobacco: Never Used  . Alcohol Use: No   OB History   Grav Para Term Preterm Abortions TAB SAB Ect Mult Living                 Review of Systems  All other systems reviewed and are negative.      Allergies  Sulfa antibiotics  Home Medications   Current Outpatient Rx  Name  Route  Sig  Dispense  Refill  . amLODipine (NORVASC) 5 MG tablet   Oral   Take 5 mg by mouth 2 (two) times daily.          Marland Kitchen aspirin EC 81 MG tablet   Oral   Take 81 mg by mouth daily.         Marland Kitchen atorvastatin (LIPITOR) 20 MG tablet   Oral    Take 20 mg by mouth daily.         . ergocalciferol (VITAMIN D2) 50000 UNITS capsule   Oral   Take 50,000 Units by mouth once a week. On wednesday         . fluticasone (FLONASE) 50 MCG/ACT nasal spray   Nasal   Place 1 spray into the nose daily.          . insulin lispro protamine-lispro (HUMALOG 75/25 MIX) (75-25) 100 UNIT/ML SUSP injection   Subcutaneous   Inject 35-50 Units into the skin 2 (two) times daily with a meal. Sliding scale         . lisinopril (PRINIVIL,ZESTRIL) 10 MG tablet   Oral   Take 1 tablet (10 mg total) by mouth daily.   90 tablet   4   . metFORMIN (GLUCOPHAGE-XR) 500 MG 24 hr tablet   Oral   Take 500 mg by mouth 2 (two) times daily.         . Multiple Vitamin (MULTIVITAMIN WITH MINERALS) TABS tablet   Oral   Take 1 tablet by mouth daily.         . pantoprazole (PROTONIX) 40 MG tablet   Oral   Take 40 mg by mouth daily.         . sertraline (ZOLOFT) 100 MG tablet   Oral   Take 100 mg by mouth daily.            BP 147/63  Pulse 93  Temp(Src) 98.7 F (37.1 C) (Oral)  Resp 21  SpO2 95% Physical Exam  Nursing note and vitals reviewed. Constitutional: She is oriented to person, place, and time. She appears well-developed and well-nourished. No distress.  Awake, alert, nontoxic appearance  HENT:  Head: Normocephalic.  Right Ear: External ear normal.  Left Ear: External ear normal.  Mouth/Throat: Oropharynx is clear and moist.  An egg size hematoma with a scab were noted to R parietal region, nontender, non infected.    Eyes: Conjunctivae and EOM are normal. Pupils are equal, round, and reactive to light. Right eye exhibits no discharge. Left eye exhibits no discharge.  Neck: Neck supple.  Cardiovascular: Normal rate and regular rhythm.   Pulmonary/Chest: Effort normal. No respiratory distress. She exhibits no tenderness.  Abdominal: Soft. There is no tenderness. There is no rebound.  Musculoskeletal: She exhibits no  tenderness.  ROM appears intact, no obvious focal weakness  Neurological: She is alert and oriented to person, place, and time.  Neurologic exam:  Speech clear, pupils equal round reactive to light, extraocular movements intact  Normal peripheral visual fields Cranial nerves III through XII normal including no facial droop Follows commands, moves all extremities x4, normal strength to bilateral upper and lower extremities at all major muscle groups including grip Sensation normal to light touch  Coordination intact, no limb ataxia, finger-nose-finger normal Rapid alternating movements normal No pronator drift Gait normal   Skin: No rash noted.  Psychiatric: She has a normal mood and affect.    ED Course  Procedures (including critical care time)  6:29 AM Patient complains of generalized weakness since this morning. She has no focal neuro deficit on exam. She has a hematoma to the scalp but denies any recent trauma. Remote history of seizure. Will obtain head CT scan. UA shows evidence of UTI although patient denies any dysuria. Patient is mildly dehydrated. IV fluid given. Will consider treating UTI. Care discussed with attending.    7:45 AM Head CT demonstrated right parietal scalp contusion. No other acute intracranial process.   8:29 AM Saint Jude monitor was interrogated and functioning appropriately.  Pt able to ambulate without difficulty, and mentating appropriately.  Will treat UTI with Keflex, urine culture sent.  Some evidence of dehydration.  Able to tolerates PO at this time, recommend drink plenty of fluid.  Pt recommend to keep a diary of her blood sugar and f/u with PCP.  Return precaution discussed.    Labs Review Labs Reviewed  CBC - Abnormal; Notable for the following:    RBC 3.65 (*)    Hemoglobin 11.6 (*)    HCT 33.0 (*)    Platelets 126 (*)    All other components within normal limits  COMPREHENSIVE METABOLIC PANEL - Abnormal; Notable for the following:     Glucose, Bld 271 (*)    BUN 24 (*)    Creatinine, Ser 1.40 (*)    Total Bilirubin 0.2 (*)    GFR calc non Af Amer 37 (*)    GFR calc Af Amer 42 (*)    All other components within normal limits  URINALYSIS, ROUTINE W REFLEX MICROSCOPIC - Abnormal; Notable for the following:    APPearance CLOUDY (*)    Glucose, UA 250 (*)    Hgb urine dipstick SMALL (*)    Protein, ur 30 (*)    Leukocytes, UA MODERATE (*)    All other components within normal limits  URINE MICROSCOPIC-ADD ON - Abnormal; Notable for the following:    Bacteria, UA FEW (*)    Casts HYALINE CASTS (*)    All other components within normal limits  I-STAT CHEM 8, ED - Abnormal; Notable for the following:    BUN 25 (*)    Creatinine, Ser 1.60 (*)    Glucose, Bld 272 (*)    Hemoglobin 11.2 (*)    HCT 33.0 (*)    All other components within normal limits  URINE CULTURE  CBG MONITORING, ED   Imaging Review Ct Head Wo Contrast  08/03/2013   CLINICAL DATA:  Weakness, fall  EXAM: CT HEAD WITHOUT CONTRAST  TECHNIQUE: Contiguous axial images were obtained from the base of the skull through the vertex without intravenous contrast.  COMPARISON:  Prior CT from 06/10/2013  FINDINGS: Atrophy with moderate chronic microvascular ischemic changes are stable as compared to prior exam. There is no acute intracranial hemorrhage or infarct. No mass lesion or midline shift. Gray-white matter differentiation is well maintained. Ventricles are normal in size without evidence of  hydrocephalus. CSF containing spaces are within normal limits. No extra-axial fluid collection. Prominent vascular calcifications present within the carotid siphons bilaterally as well as the distal vertebral arteries.  Sequelae of prior bifrontal craniotomy is present. Calvarium is otherwise intact.  Orbital soft tissues are within normal limits.  The paranasal sinuses and mastoid air cells are well pneumatized and free of fluid.  Right parietal scalp cephalohematoma is  present.  IMPRESSION: 1. Right parietal scalp contusion.  No acute intracranial process. 2. Atrophy with moderate chronic microvascular ischemic disease, unchanged.   Electronically Signed   By: Jeannine Boga M.D.   On: 08/03/2013 07:03     EKG Interpretation   Date/Time:  Saturday August 03 2013 03:28:12 EST Ventricular Rate:  96 PR Interval:  163 QRS Duration: 145 QT Interval:  433 QTC Calculation: 547 R Axis:   75 Text Interpretation:  Sinus rhythm Left bundle branch block No significant  change since last tracing Confirmed by OTTER  MD, OLGA ([id number]) on  08/03/2013 3:52:57 AM      MDM   Final diagnoses:  Generalized weakness  UTI (lower urinary tract infection)  Hematoma of right parietal scalp    BP 146/57  Pulse 89  Temp(Src) 98.7 F (37.1 C) (Oral)  Resp 19  SpO2 94%  I have reviewed nursing notes and vital signs. I personally reviewed the imaging tests through PACS system  I reviewed available ER/hospitalization records thought the EMR     Domenic Moras, [place] 08/03/13 [id number]

## 2013-08-03 NOTE — ED Notes (Signed)
Charge RN to bedside to interrogate pt St. Jude's pacemaker/defib. Pt family provided coffee. Having very pleasant conversation with pt and family.

## 2013-08-03 NOTE — ED Notes (Addendum)
Pt. Got up to check insulin - cbg 102..little low.Marland Kitchenate some pineapple. And cbg up to 282.  Called a friend r/t weakness and was to told to call 911.  Pt. Does have a bruise on rt. Side of head, which seems to be un related to tonight. Pt. Denies. Falling.

## 2013-08-03 NOTE — Discharge Instructions (Signed)
Your generalized weakness may be related to a urinary tract infection.  Take antibiotic as prescribed for the full duration.  Keep a diary of your blood sugar and share it with your doctor when you visit him next week for recheck.  Apply ice compress to bruising on scalp to decrease swelling.    Urinary Tract Infection Urinary tract infections (UTIs) can develop anywhere along your urinary tract. Your urinary tract is your body's drainage system for removing wastes and extra water. Your urinary tract includes two kidneys, two ureters, a bladder, and a urethra. Your kidneys are a pair of bean-shaped organs. Each kidney is about the size of your fist. They are located below your ribs, one on each side of your spine. CAUSES Infections are caused by microbes, which are microscopic organisms, including fungi, viruses, and bacteria. These organisms are so small that they can only be seen through a microscope. Bacteria are the microbes that most commonly cause UTIs. SYMPTOMS  Symptoms of UTIs may vary by age and gender of the patient and by the location of the infection. Symptoms in young women typically include a frequent and intense urge to urinate and a painful, burning feeling in the bladder or urethra during urination. Older women and men are more likely to be tired, shaky, and weak and have muscle aches and abdominal pain. A fever may mean the infection is in your kidneys. Other symptoms of a kidney infection include pain in your back or sides below the ribs, nausea, and vomiting. DIAGNOSIS To diagnose a UTI, your caregiver will ask you about your symptoms. Your caregiver also will ask to provide a urine sample. The urine sample will be tested for bacteria and white blood cells. White blood cells are made by your body to help fight infection. TREATMENT  Typically, UTIs can be treated with medication. Because most UTIs are caused by a bacterial infection, they usually can be treated with the use of  antibiotics. The choice of antibiotic and length of treatment depend on your symptoms and the type of bacteria causing your infection. HOME CARE INSTRUCTIONS  If you were prescribed antibiotics, take them exactly as your caregiver instructs you. Finish the medication even if you feel better after you have only taken some of the medication.  Drink enough water and fluids to keep your urine clear or pale yellow.  Avoid caffeine, tea, and carbonated beverages. They tend to irritate your bladder.  Empty your bladder often. Avoid holding urine for long periods of time.  Empty your bladder before and after sexual intercourse.  After a bowel movement, women should cleanse from front to back. Use each tissue only once. SEEK MEDICAL CARE IF:   You have back pain.  You develop a fever.  Your symptoms do not begin to resolve within 3 days. SEEK IMMEDIATE MEDICAL CARE IF:   You have severe back pain or lower abdominal pain.  You develop chills.  You have nausea or vomiting.  You have continued burning or discomfort with urination. MAKE SURE YOU:   Understand these instructions.  Will watch your condition.  Will get help right away if you are not doing well or get worse. Document Released: 03/02/2005 Document Revised: 11/22/2011 Document Reviewed: 07/01/2011 [facility] Patient Information 2014 [place].

## 2013-08-03 NOTE — ED Notes (Signed)
Alexandra [name], from [place] calling reporting no signs of arrhythmias from interrogation report. Has not had an episode of AFIB since Jan 28th. Report given to [place], [place].

## 2013-08-03 NOTE — ED Provider Notes (Signed)
Medical screening examination/treatment/procedure(s) were performed by non-physician practitioner and as supervising physician I was immediately available for consultation/collaboration.   EKG Interpretation   Date/Time:  Saturday August 03 2013 03:28:12 EST Ventricular Rate:  96 PR Interval:  163 QRS Duration: 145 QT Interval:  433 QTC Calculation: 547 R Axis:   75 Text Interpretation:  Sinus rhythm Left bundle branch block No significant  change since last tracing Confirmed by Valeree Leidy  MD, Jameel Quant ([id number]) on  08/03/2013 3:52:57 AM       Kalman Drape, MD 08/03/13 [phone]

## 2013-08-03 NOTE — ED Notes (Signed)
Patient transported to CT 

## 2013-08-05 LAB — URINE CULTURE: Colony Count: >=100000

## 2013-08-07 NOTE — Progress Notes (Signed)
ED Antimicrobial Stewardship Positive Culture Follow Up   Alexandra [name] is an 73 y.o. female who presented to [facility] on 08/03/2013 with a chief complaint of  Chief Complaint  Patient presents with  . Weakness    Recent Results (from the past 720 hour(s))  URINE CULTURE     Status: None   Collection Time    08/03/13  5:29 AM      Result Value Ref Range Status   Specimen Description URINE, RANDOM   Final   Special Requests NONE   Final   Culture  Setup Time     Final   Value: 08/03/2013 16:45     Performed at [facility]     Final   Value: >=100,000 COLONIES/ML     Performed at [facility]   Culture     Final   Value: ENTEROCOCCUS SPECIES     Performed at [facility]   Report Status 08/05/2013 FINAL   Final   Organism ID, Bacteria ENTEROCOCCUS SPECIES   Final    [x]  Treated with Keflex, organism resistant to prescribed antimicrobial []  Patient discharged originally without antimicrobial agent and treatment is now indicated  New antibiotic prescription: Amoxicillin 500mg  PO BID x 7 days  ED Provider: Harvie Heck, PA-C   Earleen Newport 08/07/2013, 2:18 PM Infectious Diseases Pharmacist Phone# [phone]

## 2013-08-07 NOTE — ED Provider Notes (Signed)
Culture report reviewed, Patient discharged with Cephalexin. Culture reports shows sensitive to Amoxicillin.  Rx: Amoxicillin 500 mg BID x7 days.  Lorrine Kin, PA-C 08/07/13 1120

## 2013-08-08 NOTE — ED Provider Notes (Signed)
Medical screening examination/treatment/procedure(s) were performed by non-physician practitioner and as supervising physician I was immediately available for consultation/collaboration.  Orpah Greek, MD 08/08/13 [phone]

## 2013-08-09 ENCOUNTER — Telehealth (HOSPITAL_COMMUNITY): Payer: Self-pay

## 2013-08-09 NOTE — ED Notes (Signed)
Post ED Visit - Positive Culture Follow-up: Successful Patient Follow-Up  Culture assessed and recommendations reviewed by: [x]  Wes [place], Pharm.D., BCPS []  Heide Guile, Pharm.D., BCPS []  Alycia Rossetti, Pharm.D., BCPS []  [place], [place].D., BCPS, AAHIVP []  Legrand Como, Pharm.D., BCPS, AAHIVP  Positive Urine culture  []  Patient discharged without antimicrobial prescription and treatment is now indicated [x]  Organism is resistant to prescribed ED discharge antimicrobial Keflex []  Patient with positive blood cultures  Changes discussed with ED provider: Jorene Minors PA  New antibiotic prescription "Amoxicillin 500 mg po BID x 7 days" Called to [facility] [phone] and given to [place] patient, date 08/09/13, time 12:58 Pt informed of dx, to stop taking Keflex and need for addl tx(Amoxicillin).   Dortha Kern 08/09/2013, 12:52 PM

## 2013-08-23 ENCOUNTER — Encounter: Payer: Self-pay | Admitting: Neurology

## 2013-08-23 ENCOUNTER — Telehealth: Payer: Self-pay | Admitting: Neurology

## 2013-08-23 ENCOUNTER — Encounter (INDEPENDENT_AMBULATORY_CARE_PROVIDER_SITE_OTHER): Payer: Self-pay

## 2013-08-23 ENCOUNTER — Ambulatory Visit (INDEPENDENT_AMBULATORY_CARE_PROVIDER_SITE_OTHER): Payer: Medicare Other | Admitting: Neurology

## 2013-08-23 VITALS — BP 142/72 | HR 79 | Wt 189.0 lb

## 2013-08-23 DIAGNOSIS — G40309 Generalized idiopathic epilepsy and epileptic syndromes, not intractable, without status epilepticus: Secondary | ICD-10-CM

## 2013-08-23 HISTORY — DX: Generalized idiopathic epilepsy and epileptic syndromes, not intractable, without status epilepticus: G40.309

## 2013-08-23 MED ORDER — LEVETIRACETAM 500 MG PO TABS: ORAL_TABLET | ORAL | Status: DC

## 2013-08-23 NOTE — Telephone Encounter (Signed)
Pt called.  She was last seen 01/2012 per [facility]. She stated that last night she had a seizure and she doesn't remember anything that happened. EMS was called and they were with her at least an hour. They told her before they left that she would need to get an appointment with her neurologist today. Please contact Pt for an appointment. Thank you.

## 2013-08-23 NOTE — Progress Notes (Signed)
Reason for visit: Seizure  Alexandra [name] is an 73 y.o. 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 The patient was on Dilantin at that time, but she had difficulty controlling the blood levels, and eventually she was switched to [place]. The patient was eventually tapered off of Keppra in 2013. The patient was last seen through this office on 01/18/2012. The last seizure was in the summer of 2012. The patient has done quite well since that time, but the patient was recently seen in emergency room when she found a knot on her head, without any known history of falling. A CT scan of the brain was done, showing no acute changes, but the contusion on the scalp was noted. The patient has done well until last evening when she had a witnessed seizure event. The patient had a seizure that started with head turning to the right, and she had generalized stiffening and jerking. The patient was confused and combative afterwards, and it took her about 30-45 minutes to become fully responsive. The patient has done well since that time. The patient did not bite her tongue or lose control of the bowels or the bladder. The patient returns to this office for an evaluation. The patient reports no new numbness or weakness of the face, arms, or legs. The patient has some mild gait instability, but this is not a new issue. The patient denies any visual loss or significant problems with headache, although she does report an occasional headache.  Past Medical History  Diagnosis Date  . HTN (hypertension)   . CAD (coronary artery disease)     a. Remote CABG in 2008 in [place].;  b. LHC (06/2013):  pLAD 50 then 40, dOM occluded, pRCA 50, mRCA 95, dRCA occluded, RV marginal 70, L-LAD ok, S-dCFX ok, S-RCA ok; EF 45% with AL HK  . DM (diabetes mellitus)   .  HTN (hypertension)   . PAF (paroxysmal atrial fibrillation)     a. Not on Coumadin due to history of falls and SDH  . GERD (gastroesophageal reflux disease)   . Venous insufficiency   . Left bundle branch block   . Obesity   . Seizure     a. 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  Marland Kitchen Hx of echocardiogram     a. Echo (06/2013):  Mod LVH, EF 50-55%, MAC, mild MR, mild LAE  . Ischemic cardiomyopathy     Myoview (06/2013):  Large infarct in apex and dAnt and sept wall, mild peri-infarct ischemia, EF 40%  . H/O sudden cardiac death successfully resuscitated     probably arrhythmic cause => ICD implanted  . S/P implantation of automatic cardioverter/defibrillator (AICD)     St Jude; Dr. Rayann Heman  . Carotid stenosis     Carotid US (07/2013):  Bilat ICA 1-39% (repeat 1 year).  . Generalized convulsive epilepsy without mention of intractable epilepsy 08/23/2013  . Breast cancer     s/p radiation  . Uterine cancer     Hysterectomy  . Vitamin D deficiency   . Polyneuropathy in diabetes(357.2)     Past Surgical History  Procedure Laterality Date  . Partial hysterectomy      1985  . Total abdominal hysterectomy  1986  . Coronary artery bypass graft  2008    x3 2008.  [place] (Dr.  Wende Neighb[facility] [phone]  . Cholecystectomy    . Subdural hematoma evacuation via craniotomy  2012    bil x 2  . Implantable cardioverter defibrillator implant  Jan 2015     [place] [address] model [id number] (serial Number [id number]) ICD    Family History  Problem Relation Age of Onset  . Emphysema Father   . Hypertension Father   . Hypertension Mother   . Stroke Mother   . Diabetes Mother   . Diabetes Brother   . Diabetes Sister     Social history:  reports that she has never smoked. She has never used smokeless tobacco. She reports that she does not drink alcohol or use illicit drugs.    Allergies  Allergen Reactions  . Sulfa Antibiotics Rash     Medications:  Current Outpatient Prescriptions on File Prior to Visit  Medication Sig Dispense Refill  . amLODipine (NORVASC) 5 MG tablet Take 5 mg by mouth 2 (two) times daily.       Marland Kitchen aspirin EC 81 MG tablet Take 81 mg by mouth daily.      Marland Kitchen atorvastatin (LIPITOR) 20 MG tablet Take 20 mg by mouth daily.      . ergocalciferol (VITAMIN D2) 50000 UNITS capsule Take 50,000 Units by mouth once a week. On wednesday      . fluticasone (FLONASE) 50 MCG/ACT nasal spray Place 1 spray into the nose daily.       . insulin lispro protamine-lispro (HUMALOG 75/25 MIX) (75-25) 100 UNIT/ML SUSP injection Inject 35-50 Units into the skin 2 (two) times daily with a meal. 35 units am/ 35 units pmSliding scale      . lisinopril (PRINIVIL,ZESTRIL) 10 MG tablet Take 1 tablet (10 mg total) by mouth daily.  90 tablet  4  . metFORMIN (GLUCOPHAGE-XR) 500 MG 24 hr tablet Take 500 mg by mouth 2 (two) times daily.      . Multiple Vitamin (MULTIVITAMIN WITH MINERALS) TABS tablet Take 1 tablet by mouth daily.      . pantoprazole (PROTONIX) 40 MG tablet Take 40 mg by mouth daily.      . sertraline (ZOLOFT) 100 MG tablet Take 100 mg by mouth daily.         No current facility-administered medications on file prior to visit.    ROS:  Out of a complete 14 system review of symptoms, the patient complains only of the following symptoms, and all other reviewed systems are negative.  Leg swelling Restless legs Joint pain, back pain, muscle cramps Dizziness, headache, seizure  Blood pressure 142/72, pulse 79, weight 189 lb (85.73 kg).  Physical Exam  General: The patient is alert and cooperative at the time of the examination.  Skin: No significant peripheral edema is noted.   Neurologic Exam  Mental status: The patient is oriented x 3.  Cranial nerves: Facial symmetry is present. Speech is normal, no aphasia or dysarthria is noted. Extraocular movements are full. Visual fields are full.  Motor: The patient  has good strength in all 4 extremities.  Sensory examination: Soft touch sensation is symmetric on the face, arms, and legs.  Coordination: The patient has good finger-nose-finger and heel-to-shin bilaterally.  Gait and station: The patient has a normal gait. Tandem gait is normal. Romberg is negative. No drift is seen.  Reflexes: Deep tendon reflexes are symmetric.   CT brain 08/03/2013:  IMPRESSION:  1. Right parietal scalp contusion. No acute intracranial process.  2.  Atrophy with moderate chronic microvascular ischemic disease,  unchanged.    Assessment/Plan:  1. Recurrent seizure event  2. History of bilateral subdural hematomas, status post surgical drainage  The patient had recurring seizures, and she likely will need to be back on anticonvulsant medications for the indefinite future. The patient is not to operate a motor vehicle for 6 months. The patient will be placed back on Keppra, taking 250 mg twice daily for one week, then go to 500 mg twice daily. The patient will followup in 4 months. The patient is to contact us if any further problems arise. The recent event when the patient found a "knot" on the head likely represented an unrecognized seizure.  Jill Alexanders MD 08/23/2013 6:37 PM  [facility] [address] [place], [place] [address]  Phone [phone] Fax [phone]

## 2013-08-23 NOTE — Patient Instructions (Signed)
Epilepsy Epilepsy is a disorder in which a person has repeated seizures over time. A seizure is a release of abnormal electrical activity in the brain. Seizures can cause a change in attention, behavior, or the ability to remain awake and alert (altered mental status). Seizures often involve uncontrollable shaking (convulsions).  Most people with epilepsy lead normal lives. However, people with epilepsy are at an increased risk of falls, accidents, and injuries. Therefore, it is important to begin treatment right away. CAUSES  Epilepsy has many possible causes. Anything that disturbs the normal pattern of brain cell activity can lead to seizures. This may include:   Head injury.  Birth trauma.  High fever as a child.  Stroke.  Bleeding into or around the brain.  Certain drugs.  Prolonged low oxygen, such as what occurs after CPR efforts.  Abnormal brain development.  Certain illnesses, such as meningitis, encephalitis (brain infection), malaria, and other infections.  An imbalance of nerve signaling chemicals (neurotransmitters).  SIGNS AND SYMPTOMS  The symptoms of a seizure can vary greatly from one person to another. Right before a seizure, you may have a warning (aura) that a seizure is about to occur. An aura may include the following symptoms:  Fear or anxiety.  Nausea.  Feeling like the room is spinning (vertigo).  Vision changes, such as seeing flashing lights or spots. Common symptoms during a seizure include:  Abnormal sensations, such as an abnormal smell or a bitter taste in the mouth.   Sudden, general body stiffness.   Convulsions that involve rhythmic jerking of the face, arm, or leg on one or both sides.   Sudden change in consciousness.   Appearing to be awake but not responding.   Appearing to be asleep but cannot be awakened.   Grimacing, chewing, lip smacking, drooling, tongue biting, or loss of bowel or bladder control. After a seizure,  you may feel sleepy for a while. DIAGNOSIS  Your health care provider will ask about your symptoms and take a medical history. Descriptions from any witnesses to your seizures will be very helpful in the diagnosis. A physical exam, including a detailed neurological exam, is necessary. Various tests may be done, such as:   An electroencephalogram (EEG). This is a painless test of your brain waves. In this test, a diagram is created of your brain waves. These diagrams can be interpreted by a specialist.  An MRI of the brain.   A CT scan of the brain.   A spinal tap (lumbar puncture, LP).  Blood tests to check for signs of infection or abnormal blood chemistry. TREATMENT  There is no cure for epilepsy, but it is generally treatable. Once epilepsy is diagnosed, it is important to begin treatment as soon as possible. For most people with epilepsy, seizures can be controlled with medicines. The following may also be used:  A pacemaker for the brain (vagus nerve stimulator) can be used for people with seizures that are not well controlled by medicine.  Surgery on the brain. For some people, epilepsy eventually goes away. HOME CARE INSTRUCTIONS   Follow your health care provider's recommendations on driving and safety in normal activities.  Get enough rest. Lack of sleep can cause seizures.  Only take over-the-counter or prescription medicines as directed by your health care provider. Take any prescribed medicine exactly as directed.  Avoid any known triggers of your seizures.  Keep a seizure diary. Record what you recall about any seizure, especially any possible trigger.   Make   sure the people you live and work with know that you are prone to seizures. They should receive instructions on how to help you. In general, a witness to a seizure should:   Cushion your head and body.   Turn you on your side.   Avoid unnecessarily restraining you.   Not place anything inside your  mouth.   Call for emergency medical help if there is any question about what has occurred.   Follow up with your health care provider as directed. You may need regular blood tests to monitor the levels of your medicine.  SEEK MEDICAL CARE IF:   You develop signs of infection or other illness. This might increase the risk of a seizure.   You seem to be having more frequent seizures.   Your seizure pattern is changing.  SEEK IMMEDIATE MEDICAL CARE IF:   You have a seizure that does not stop after a few moments.   You have a seizure that causes any difficulty in breathing.   You have a seizure that results in a very severe headache.   You have a seizure that leaves you with the inability to speak or use a part of your body.  Document Released: 05/23/2005 Document Revised: 03/13/2013 Document Reviewed: 01/02/2013 ExitCare Patient Information 2014 [employer].  

## 2013-09-03 ENCOUNTER — Telehealth: Payer: Self-pay | Admitting: *Deleted

## 2013-09-03 DIAGNOSIS — G40309 Generalized idiopathic epilepsy and epileptic syndromes, not intractable, without status epilepticus: Secondary | ICD-10-CM

## 2013-09-03 NOTE — Telephone Encounter (Signed)
I called the patient, no EEG was ordered, and it is not listed in the plan but I will order this test. The patient has had what appeared to be a well recognized clinical seizure, she will be going on medications.

## 2013-09-03 NOTE — Telephone Encounter (Signed)
Patient says that Dr. Jannifer Franklin told her he was going to order a EEG. She is very concerned that she has not heard anything from our office. According, to last office note patient is to follow up in 4 months. Please advise.

## 2013-09-03 NOTE — Addendum Note (Signed)
Addended by: Margette Fast on: 09/03/2013 05:08 PM   Modules accepted: Orders

## 2013-09-04 ENCOUNTER — Telehealth: Payer: Self-pay | Admitting: Neurology

## 2013-09-04 NOTE — Telephone Encounter (Signed)
Left message for patient to call and schedule routine EEG per Dr. Jannifer Franklin.

## 2013-09-09 ENCOUNTER — Telehealth: Payer: Self-pay | Admitting: Neurology

## 2013-09-09 ENCOUNTER — Ambulatory Visit (INDEPENDENT_AMBULATORY_CARE_PROVIDER_SITE_OTHER): Payer: Medicare Other

## 2013-09-09 DIAGNOSIS — G40309 Generalized idiopathic epilepsy and epileptic syndromes, not intractable, without status epilepticus: Secondary | ICD-10-CM

## 2013-09-09 NOTE — Telephone Encounter (Signed)
I called patient. The EEG study was unremarkable, but the patient likely still has seizures, and she is to remain on the [place].

## 2013-09-09 NOTE — Procedures (Signed)
    History:  Alexandra [name] is a 73 year old patient with a history of head trauma resulting in bilateral subdural hematomas. The patient was placed on Dilantin, and then switched to [place]. The patient has had a recent event of head turning to the right, with a witnessed seizure event. The patient was combative for 30 or 45 minutes following the event. The patient is being evaluated for the seizures.  This is a routine EEG. Prior skull defects are seen bifrontally. Medications include Norvasc, aspirin, Lipitor, vitamin D, Flonase, Keppra, lisinopril, metformin, multivitamins, Protonix, and Zoloft.   EEG classification: Normal awake and drowsy  Description of the recording: The background rhythms of this recording consists of a fairly well modulated medium amplitude alpha rhythm of 9 Hz that is reactive to eye opening and closure. As the record progresses, the patient appears to remain in the waking state throughout the recording. Photic stimulation was performed, resulting in a bilateral and symmetric photic driving response. Hyperventilation was also performed, resulting in a minimal buildup of the background rhythm activities without significant slowing seen. Toward the end of the recording, the patient enters the drowsy state with slight symmetric slowing seen. The patient never enters stage II sleep. At no time during the recording does there appear to be evidence of spike or spike wave discharges or evidence of focal slowing. EKG monitor shows no evidence of cardiac rhythm abnormalities with a heart rate of 60.  Impression: This is a normal EEG recording in the waking and drowsy state. No evidence of ictal or interictal discharges are seen.

## 2013-09-16 ENCOUNTER — Other Ambulatory Visit: Payer: Self-pay

## 2013-09-18 ENCOUNTER — Encounter: Payer: Self-pay | Admitting: Internal Medicine

## 2013-09-18 ENCOUNTER — Ambulatory Visit (INDEPENDENT_AMBULATORY_CARE_PROVIDER_SITE_OTHER): Payer: PRIVATE HEALTH INSURANCE | Admitting: Internal Medicine

## 2013-09-18 VITALS — BP 148/68 | HR 68 | Ht 63.0 in | Wt 186.0 lb

## 2013-09-18 DIAGNOSIS — I255 Ischemic cardiomyopathy: Secondary | ICD-10-CM

## 2013-09-18 DIAGNOSIS — I2510 Atherosclerotic heart disease of native coronary artery without angina pectoris: Secondary | ICD-10-CM

## 2013-09-18 DIAGNOSIS — Z8674 Personal history of sudden cardiac arrest: Secondary | ICD-10-CM

## 2013-09-18 DIAGNOSIS — R55 Syncope and collapse: Secondary | ICD-10-CM

## 2013-09-18 DIAGNOSIS — Z95810 Presence of automatic (implantable) cardiac defibrillator: Secondary | ICD-10-CM

## 2013-09-18 DIAGNOSIS — I2589 Other forms of chronic ischemic heart disease: Secondary | ICD-10-CM

## 2013-09-18 LAB — MDC_IDC_ENUM_SESS_TYPE_INCLINIC
Battery Remaining Longevity: 103.2 mo
Brady Statistic RV Percent Paced: 0 %
Date Time Interrogation Session: 20150415110934
HighPow Impedance: 43.875
Implantable Pulse Generator Serial Number: 7150224
Lead Channel Impedance Value: 550 Ohm
Lead Channel Pacing Threshold Amplitude: 0.5 V
Lead Channel Pacing Threshold Pulse Width: 0.5 ms
Lead Channel Sensing Intrinsic Amplitude: 11.8 mV
Lead Channel Sensing Intrinsic Amplitude: 3.1 mV
Lead Channel Setting Pacing Amplitude: 3.5 V
Lead Channel Setting Sensing Sensitivity: 0.5 mV
MDC IDC MSMT LEADCHNL RA IMPEDANCE VALUE: 425 Ohm
MDC IDC MSMT LEADCHNL RA PACING THRESHOLD AMPLITUDE: 1 V
MDC IDC MSMT LEADCHNL RA PACING THRESHOLD PULSEWIDTH: 0.5 ms
MDC IDC PG MODEL: 2357
MDC IDC SET LEADCHNL RA PACING AMPLITUDE: 3.5 V
MDC IDC SET LEADCHNL RV PACING PULSEWIDTH: 0.5 ms
MDC IDC STAT BRADY RA PERCENT PACED: 3.3 %
Zone Setting Detection Interval: 250 ms
Zone Setting Detection Interval: 300 ms
Zone Setting Detection Interval: 350 ms

## 2013-09-18 NOTE — Patient Instructions (Signed)
Your physician recommends that you continue on your current medications as directed. Please refer to the Current Medication list given to you today.  Remote monitoring is used to monitor your Pacemaker of ICD from home. This monitoring reduces the number of office visits required to check your device to one time per year. It allows [place] to keep an eye on the functioning of your device to ensure it is working properly. You are scheduled for a device check from home on 12/23/13. You may send your transmission at any time that day. If you have a wireless device, the transmission will be sent automatically. After your physician reviews your transmission, you will receive a postcard with your next transmission date.  Your physician wants you to follow-up in: 9 months with Dr. Rayann Heman.  You will receive a reminder letter in the mail two months in advance. If you don't receive a letter, please call our office to schedule the follow-up appointment.  Nira Conn, RN will contact you about monitoring your device

## 2013-09-18 NOTE — Progress Notes (Signed)
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  Since having her ICD implanted, the patient reports doing very well.  Today, she denies symptoms of palpitations, chest pain, shortness of breath,  lower extremity edema, dizziness, presyncope, syncope, or ICD shocks.  The patient is otherwise without complaint today.   Past Medical History  Diagnosis Date  . HTN (hypertension)   . CAD (coronary artery disease)     a. Remote CABG in 2008 in [place].;  b. LHC (06/2013):  pLAD 50 then 40, dOM occluded, pRCA 50, mRCA 95, dRCA occluded, RV marginal 70, L-LAD ok, S-dCFX ok, S-RCA ok; EF 45% with AL HK  . DM (diabetes mellitus)   . HTN (hypertension)   . PAF (paroxysmal atrial fibrillation)     a. Not on Coumadin due to history of falls and SDH  . GERD (gastroesophageal reflux disease)   . Venous insufficiency   . Left bundle branch block   . Obesity   . Seizure     a. 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  Marland Kitchen Hx of echocardiogram     a. Echo (06/2013):  Mod LVH, EF 50-55%, MAC, mild MR, mild LAE  . Ischemic cardiomyopathy     Myoview (06/2013):  Large infarct in apex and dAnt and sept wall, mild peri-infarct ischemia, EF 40%  . H/O sudden cardiac death successfully resuscitated     probably arrhythmic cause => ICD implanted  . S/P implantation of automatic cardioverter/defibrillator (AICD)     [employer] Jude; Dr. Rayann Heman  . Carotid stenosis     Carotid US (07/2013):  Bilat ICA 1-39% (repeat 1 year).  . Generalized convulsive epilepsy without mention of intractable epilepsy 08/23/2013  . Breast cancer     s/p radiation  . Uterine cancer     Hysterectomy  . Vitamin D deficiency   . Polyneuropathy in diabetes(357.2)    Past Surgical History  Procedure Laterality Date  . Partial hysterectomy      1985  . Total abdominal hysterectomy  1986  . Coronary artery bypass graft  2008    x3  2008.  [place] (Dr. Wende Neighbors[facility] [phone]  . Cholecystectomy    . Subdural hematoma evacuation via craniotomy  2012    bil x 2  . Implantable cardioverter defibrillator implant  06/14/13     [place] DR model [id number] (serial Number [id number]) ICD    Current Outpatient Prescriptions  Medication Sig Dispense Refill  . amLODipine (NORVASC) 5 MG tablet Take 5 mg by mouth 2 (two) times daily.       Marland Kitchen aspirin EC 81 MG tablet Take 81 mg by mouth daily.      Marland Kitchen atorvastatin (LIPITOR) 20 MG tablet Take 20 mg by mouth daily.      Marland Kitchen BYSTOLIC 10 MG tablet Take 10 mg by mouth 2 (two) times daily.       . ergocalciferol (VITAMIN D2) 50000 UNITS capsule Take 50,000 Units by mouth once a week. On wednesday      . insulin lispro protamine-lispro (HUMALOG 75/25 MIX) (75-25) 100 UNIT/ML SUSP injection Inject 35-50 Units into the skin 2 (two) times daily with a meal. 35 units am & 35 units pm  (sliding scale)      . levETIRAcetam (KEPPRA) 500 MG tablet One half tablet twice a day for one week, then take one tablet  twice a day  60 tablet  5  . lisinopril (PRINIVIL,ZESTRIL) 10 MG tablet Take 1 tablet (10 mg total) by mouth daily.  90 tablet  4  . metFORMIN (GLUCOPHAGE-XR) 500 MG 24 hr tablet Take 500 mg by mouth 2 (two) times daily.      . Multiple Vitamin (MULTIVITAMIN WITH MINERALS) TABS tablet Take 1 tablet by mouth daily.      . nitrofurantoin, macrocrystal-monohydrate, (MACROBID) 100 MG capsule Take 1 capsule by mouth daily.      . pantoprazole (PROTONIX) 40 MG tablet Take 40 mg by mouth daily.      . sertraline (ZOLOFT) 100 MG tablet Take 100 mg by mouth daily.         No current facility-administered medications for this visit.    Physical Exam: Filed Vitals:   09/18/13 1037  BP: 148/68  Pulse: 68  Height: 5\' 3"  (1.6 m)  Weight: 186 lb (84.369 kg)    GEN- The patient is well appearing, alert and oriented x 3 today.   Head- normocephalic,  atraumatic Eyes-  Sclera clear, conjunctiva pink Ears- hearing intact Oropharynx- clear Lungs- Clear to ausculation bilaterally, normal work of breathing Chest- ICD pocket is well healed Heart- Regular rate and rhythm, no murmurs, rubs or gallops, PMI not laterally displaced GI- soft, NT, ND, + BS Extremities- no clubbing, cyanosis, or edema  ICD interrogation- reviewed in detail today,  See PACEART report  Assessment and Plan:  1.  Chronic systolic dysfunction euvolemic today Stable on an appropriate medical regimen Normal ICD function See Pace Art report No changes today Will enroll in the [facility] device clinic with Alvis Lemmings  2. Sudden cardiac arrest  Doing well s/p ICD  No driving x 6 months  (until July 2015)  3. HTN  Stable   4. Renal failure  Improved  5. CAD  Stable  No change required today  Merlin Return to the device clinic to see me in 9 months

## 2013-09-30 ENCOUNTER — Telehealth: Payer: Self-pay | Admitting: Internal Medicine

## 2013-09-30 NOTE — Telephone Encounter (Signed)
Patient needs a note faxed over to [place] transportation. Stating that she was in the office on 4/15 @ 10am. Please fax it to attention : Early Osmond @ [phone]

## 2013-09-30 NOTE — Telephone Encounter (Signed)
Patient was seen by Dr Rayann Heman on 09/18/13 For more information, please contact office at [phone]

## 2013-10-10 ENCOUNTER — Encounter: Payer: Self-pay | Admitting: *Deleted

## 2013-10-18 ENCOUNTER — Encounter: Payer: Self-pay | Admitting: Cardiovascular Disease

## 2013-10-21 ENCOUNTER — Encounter: Payer: Self-pay | Admitting: *Deleted

## 2013-10-21 ENCOUNTER — Telehealth: Payer: Self-pay | Admitting: Internal Medicine

## 2013-10-21 ENCOUNTER — Ambulatory Visit (INDEPENDENT_AMBULATORY_CARE_PROVIDER_SITE_OTHER): Payer: PRIVATE HEALTH INSURANCE | Admitting: *Deleted

## 2013-10-21 DIAGNOSIS — Z95810 Presence of automatic (implantable) cardiac defibrillator: Secondary | ICD-10-CM

## 2013-10-21 DIAGNOSIS — I2589 Other forms of chronic ischemic heart disease: Secondary | ICD-10-CM

## 2013-10-21 DIAGNOSIS — I255 Ischemic cardiomyopathy: Secondary | ICD-10-CM

## 2013-10-21 NOTE — Telephone Encounter (Signed)
Spoke with patient and explained pre procedure protocol for colonoscopy and ICD.

## 2013-10-21 NOTE — Telephone Encounter (Signed)
New message  ° ° °Returning call back to nurse.  °

## 2013-10-21 NOTE — Telephone Encounter (Signed)
I spoke with the patient for ICM clinic. She also states that she is pending a colonoscopy on 11/18/13 at [facility] with Dr. Collene Mares. She has to be there at 9:00 am. She was told her device would need to be turned off. I advised the patient I would forward to device clinic. I am not sure if they already know about this and have notified [employer].

## 2013-10-21 NOTE — Progress Notes (Signed)
EPIC Encounter for ICM Monitoring  Patient Name: Alexandra [name] is a 73 y.o. female Date: 10/21/2013 Primary Care Physican: Stephens Shire, MD Primary Cardiologist: N[place] Electrophysiologist: Allred Dry Weight: 179 lbs       In the past month, have you:  1. Gained more than 2 pounds in a day or more than 5 pounds in a week? no  2. Had changes in your medications (with verification of current medications)? no  3. Had more shortness of breath than is usual for you? no  4. Limited your activity because of shortness of breath? no  5. Not been able to sleep because of shortness of breath? no  6. Had increased swelling in your feet or ankles? Yes. This correlated with elevated [place] readings around 5/9. The patient states she has been doing fine for the last several days. No swelling noted today.  7. Had symptoms of dehydration (dizziness, dry mouth, increased thirst, decreased urine output) no  8. Had changes in sodium restriction? no  9. Been compliant with medication? Yes   ICM trend:   Follow-up plan: ICM clinic phone appointment: 11/21/13  Copy of note sent to patient's primary care physician, primary cardiologist, and device following physician.  Emily Filbert, RN, BSN 10/21/2013 12:49 PM

## 2013-10-29 ENCOUNTER — Encounter (HOSPITAL_COMMUNITY): Payer: Self-pay | Admitting: Pharmacy Technician

## 2013-10-31 ENCOUNTER — Encounter: Payer: Self-pay | Admitting: Cardiovascular Disease

## 2013-10-31 ENCOUNTER — Ambulatory Visit (INDEPENDENT_AMBULATORY_CARE_PROVIDER_SITE_OTHER): Payer: PRIVATE HEALTH INSURANCE | Admitting: Cardiovascular Disease

## 2013-10-31 VITALS — BP 158/75 | HR 60 | Ht 63.0 in | Wt 183.8 lb

## 2013-10-31 DIAGNOSIS — I10 Essential (primary) hypertension: Secondary | ICD-10-CM

## 2013-10-31 MED ORDER — POTASSIUM CHLORIDE ER 10 MEQ PO TBCR: 10.0000 meq | EXTENDED_RELEASE_TABLET | Freq: Every day | ORAL | Status: DC

## 2013-10-31 MED ORDER — LISINOPRIL 10 MG PO TABS: 10.0000 mg | ORAL_TABLET | Freq: Every evening | ORAL | Status: AC

## 2013-10-31 MED ORDER — HYDROCHLOROTHIAZIDE 25 MG PO TABS: 25.0000 mg | ORAL_TABLET | Freq: Every day | ORAL | Status: AC

## 2013-10-31 NOTE — Assessment & Plan Note (Signed)
Alexandra [name] presents today with persistent hypertension. We started her on low-dose ACE inhibitor at one of her previous recent visits. We'll start her on hydrochlorothiazide 25 mg a day as well as potassium chloride 10 mEq a day. We'll have her return in approximately one month for basic metabolic profile. I'll see her again in 6 months. I have advised her to take her blood pressure 2-3 times a week. She should bring her blood pressure log back with her to her next visit.

## 2013-10-31 NOTE — Patient Instructions (Addendum)
Your physician has recommended you make the following change in your medication:  START Hydrochlorothiazide 25 mg once daily START KDur 10 meq once daily  Your physician recommends that you return for lab work in: 1 month for fasting lab work - do not eat or drink anything after midnight the night before except water You will need lab work again at your 6 month office visit - you do not have to fast for this appointment  Your physician has requested that you regularly monitor and record your blood pressure readings at home. Please use the same machine at the same time of day to check your readings and record them to bring to your follow-up visit.  Your physician wants you to follow-up in: 6 months with Dr. Acie Fredrickson.  You will receive a reminder letter in the mail two months in advance. If you don't receive a letter, please call our office to schedule the follow-up appointment. You do not need to fast for this appointment

## 2013-10-31 NOTE — Assessment & Plan Note (Signed)
She is stable.  Will check fasting lipids in 1 month.  Continue current meds.

## 2013-10-31 NOTE — Assessment & Plan Note (Signed)
Her rhythm is very regular and I assume  she is in normal sinus rhythm. She has an ICD in the will be able to tell her atrial fibrillation burden after interrogation of her ICD.  She's not a candidate for Coumadin. She has a history of falls and has had 2 subdural hematomas.

## 2013-10-31 NOTE — Progress Notes (Signed)
Alexandra [name] Date of Birth  05-19-1941 [place] [address]    [place]   [place], [place]  [address]    [place], [place]  [address] [phone]  Fax  [phone]  [phone]  Fax [phone]  Problem list: 1. Atrial fibrillation-not a Coumadin candidate because of a series of falls and subdural hematoma 2. Diabetes mellitus 3. Hyperlipidemia 4. History of breast cancer 5. CAD - CABG - 2008  6. Cardiac arrest - s/p ICD ( Jan. 2015)  7. Seizures -    History of Present Illness:  Alexandra [name] was hospitalized this winter after falling and having a subdural hematoma. She is seen today in followup.  Clinically she still is in sinus rhythm.  She's not had any episodes of dizziness or headaches.  Her balance is now good.  Has not had any falling recently.  Feb. 19, 2014: Alexandra [name] has done well since I saw last her. Her blood pressure has been a little elevated. She was started on lisinopril 5 mg a day by her medical doctor. She's scheduled have blood work for. She's had a reaction to ACE inhibitors in the past.  She seems to be tolerating it well.  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  Oct 31, 2013:  Alexandra [name] is feeling well.   She had not had any further falls but she has had a seizure.  She has a hx of subdural hematoma.  She is walking but notes that she gets fatigued.    Current Outpatient Prescriptions on File Prior to Visit  Medication Sig Dispense Refill  . acetaminophen (TYLENOL) 500 MG tablet Take 500 mg by mouth every 6 (six) hours as needed for mild pain.      Marland Kitchen amLODipine (NORVASC) 5 MG tablet Take 5 mg by mouth 2 (two) times daily.       Marland Kitchen aspirin EC 81 MG tablet Take 81 mg by mouth daily.      Marland Kitchen atorvastatin (LIPITOR) 20 MG tablet Take 20 mg by mouth daily.      Marland Kitchen BYSTOLIC 10 MG tablet Take 10 mg by mouth 2 (two) times daily.       . ergocalciferol (VITAMIN D2) 50000 UNITS capsule Take 50,000 Units by mouth  once a week. On wednesday      . insulin lispro protamine-lispro (HUMALOG 75/25 MIX) (75-25) 100 UNIT/ML SUSP injection Inject 35 Units into the skin 2 (two) times daily with a meal.       . levETIRAcetam (KEPPRA) 500 MG tablet Take 500 mg by mouth 2 (two) times daily.      Marland Kitchen lisinopril (PRINIVIL,ZESTRIL) 10 MG tablet Take 10 mg by mouth every evening.      . metFORMIN (GLUCOPHAGE-XR) 500 MG 24 hr tablet Take 1,000 mg by mouth 2 (two) times daily.       . Multiple Vitamin (MULTIVITAMIN WITH MINERALS) TABS tablet Take 1 tablet by mouth daily.      . nitrofurantoin, macrocrystal-monohydrate, (MACROBID) 100 MG capsule Take 1 capsule by mouth daily.      . pantoprazole (PROTONIX) 40 MG tablet Take 40 mg by mouth daily.      . sertraline (ZOLOFT) 100 MG tablet Take 100 mg by mouth daily.         No current facility-administered medications on file prior to visit.    Allergies  Allergen Reactions  . Sulfa Antibiotics Rash    Past Medical  History  Diagnosis Date  . HTN (hypertension)   . CAD (coronary artery disease)     a. Remote CABG in 2008 in [place].;  b. LHC (06/2013):  pLAD 50 then 40, dOM occluded, pRCA 50, mRCA 95, dRCA occluded, RV marginal 70, L-LAD ok, S-dCFX ok, S-RCA ok; EF 45% with AL HK  . DM (diabetes mellitus)   . HTN (hypertension)   . PAF (paroxysmal atrial fibrillation)     a. Not on Coumadin due to history of falls and SDH  . GERD (gastroesophageal reflux disease)   . Venous insufficiency   . Left bundle branch block   . Obesity   . Seizure     a. 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  Marland Kitchen Hx of echocardiogram     a. Echo (06/2013):  Mod LVH, EF 50-55%, MAC, mild MR, mild LAE  . Ischemic cardiomyopathy     Myoview (06/2013):  Large infarct in apex and dAnt and sept wall, mild peri-infarct ischemia, EF 40%  . H/O sudden cardiac death successfully resuscitated     probably arrhythmic cause => ICD implanted  . S/P implantation of automatic  cardioverter/defibrillator (AICD)     St Jude; Dr. Rayann Heman  . Carotid stenosis     Carotid US (07/2013):  Bilat ICA 1-39% (repeat 1 year).  . Generalized convulsive epilepsy without mention of intractable epilepsy 08/23/2013  . Breast cancer     s/p radiation  . Uterine cancer     Hysterectomy  . Vitamin D deficiency   . Polyneuropathy in diabetes(357.2)     Past Surgical History  Procedure Laterality Date  . Partial hysterectomy      1985  . Total abdominal hysterectomy  1986  . Coronary artery bypass graft  2008    x3 2008.  [place] (Dr. Wende Neighbors[facility] [phone]  . Cholecystectomy    . Subdural hematoma evacuation via craniotomy  2012    bil x 2  . Implantable cardioverter defibrillator implant  06/14/13     [place] model [id number] (serial Number [id number]) ICD implanted by Dr Rayann Heman for secondary prevention after an aborted sudden cardiac arrest    History  Smoking status  . Never Smoker   Smokeless tobacco  . Never Used    History  Alcohol Use No    Family History  Problem Relation Age of Onset  . Emphysema Father   . Hypertension Father   . Hypertension Mother   . Stroke Mother   . Diabetes Mother   . Diabetes Brother   . Diabetes Sister     Reviw of Systems:  Reviewed in the HPI.  All other systems are negative.  Physical Exam: Blood pressure 158/75, pulse 60, height 5\' 3"  (1.6 m), weight 183 lb 12.8 oz (83.371 kg). General: Well developed, well nourished, in no acute distress.  Head: Normocephalic, atraumatic, sclera non-icteric, mucus membranes are moist,   Neck: Supple. Carotids are 2 + without bruits. No JVD  Lungs: Clear bilaterally to auscultation.  Heart: regular rate  With normal  S1 S2. No murmurs, gallops or rubs.  Abdomen: Soft, non-tender, non-distended with normal bowel sounds. No hepatomegaly. No rebound/guarding. No masses.  Msk:  Strength and tone are normal  Extremities: No clubbing  or cyanosis. No edema.  Distal pedal pulses are 2+ and equal bilaterally.  Neuro: Alert and oriented X 3. Moves all extremities spontaneously.  Psych:  Responds to questions appropriately with a normal  affect.  ECG: 07/25/2012:  Sinus brady at 59  NS IVCD.   Assessment / Plan:

## 2013-10-31 NOTE — Assessment & Plan Note (Signed)
Will check lipids at her next office visit.

## 2013-11-07 ENCOUNTER — Encounter (HOSPITAL_COMMUNITY): Payer: Self-pay | Admitting: *Deleted

## 2013-11-18 ENCOUNTER — Ambulatory Visit (HOSPITAL_COMMUNITY)
Admission: RE | Admit: 2013-11-18 | Payer: PRIVATE HEALTH INSURANCE | Source: Ambulatory Visit | Admitting: Gastroenterology

## 2013-11-18 HISTORY — DX: Malignant (primary) neoplasm, unspecified: C80.1

## 2013-11-18 SURGERY — COLONOSCOPY
Anesthesia: Moderate Sedation

## 2013-11-21 ENCOUNTER — Telehealth: Payer: Self-pay | Admitting: Internal Medicine

## 2013-11-21 ENCOUNTER — Encounter: Payer: Self-pay | Admitting: *Deleted

## 2013-11-21 ENCOUNTER — Ambulatory Visit (INDEPENDENT_AMBULATORY_CARE_PROVIDER_SITE_OTHER): Payer: PRIVATE HEALTH INSURANCE | Admitting: *Deleted

## 2013-11-21 DIAGNOSIS — I255 Ischemic cardiomyopathy: Secondary | ICD-10-CM

## 2013-11-21 DIAGNOSIS — I2589 Other forms of chronic ischemic heart disease: Secondary | ICD-10-CM

## 2013-11-21 DIAGNOSIS — Z95810 Presence of automatic (implantable) cardiac defibrillator: Secondary | ICD-10-CM

## 2013-11-21 NOTE — Telephone Encounter (Signed)
Received. I spoke with the patient for ICM.

## 2013-11-21 NOTE — Progress Notes (Signed)
EPIC Encounter for ICM Monitoring  Patient Name: Alexandra [name] is a 73 y.o. female Date: 11/21/2013 Primary Care Physican: Stephens Shire, MD Primary Cardiologist: Nahser Electrophysiologist: Allred Dry Weight: 182 lbs       In the past month, have you:  1. Gained more than 2 pounds in a day or more than 5 pounds in a week? no  2. Had changes in your medications (with verification of current medications)? Yes . She saw Dr. Acie Fredrickson on 10/31/13 and was started on HCTZ 25 mg once daily and K-dur 10 meq once daily.  3. Had more shortness of breath than is usual for you? no  4. Limited your activity because of shortness of breath? no  5. Not been able to sleep because of shortness of breath? no  6. Had increased swelling in your feet or ankles? She has developed intermittent edema to her lower extremities over the last 2-3 weeks.  7. Had symptoms of dehydration (dizziness, dry mouth, increased thirst, decreased urine output) no  8. Had changes in sodium restriction? no  9. Been compliant with medication? Yes   ICM trend:   Follow-up plan: ICM clinic phone appointment: 12/23/13- full transmission. Despite recent elevation in [place] readings, the patient states she is feeling well. She reports that her swelling is not every day. Her HCTZ was recently started by Dr. Acie Fredrickson. She is due for a repeat bmp on 6/25. I will forward readings to Dr. Acie Fredrickson and Dr. Rayann Heman for any further recommendations. No changes made today since she has returned to baseline. I will follow with her in one month. She has been instructed to call with any changing or worsening symptoms associated with fluid.  Copy of note sent to patient's primary care physician, primary cardiologist, and device following physician.  Alvis Lemmings, RN, BSN 11/21/2013 1:44 PM

## 2013-11-21 NOTE — Telephone Encounter (Signed)
New message ° ° ° ° ° °Did we get her remote transmission? °

## 2013-11-28 ENCOUNTER — Other Ambulatory Visit (INDEPENDENT_AMBULATORY_CARE_PROVIDER_SITE_OTHER): Payer: PRIVATE HEALTH INSURANCE

## 2013-11-28 DIAGNOSIS — I10 Essential (primary) hypertension: Secondary | ICD-10-CM

## 2013-11-28 LAB — HEPATIC FUNCTION PANEL
ALK PHOS: 66 U/L (ref 39–117)
ALT: 19 U/L (ref 0–35)
AST: 18 U/L (ref 0–37)
Albumin: 3.8 g/dL (ref 3.5–5.2)
Bilirubin, Direct: 0.1 mg/dL (ref 0.0–0.3)
Total Bilirubin: 0.5 mg/dL (ref 0.2–1.2)
Total Protein: 6.5 g/dL (ref 6.0–8.3)

## 2013-11-28 LAB — BASIC METABOLIC PANEL
BUN: 24 mg/dL — ABNORMAL HIGH (ref 6–23)
CALCIUM: 9.3 mg/dL (ref 8.4–10.5)
CO2: 29 mEq/L (ref 19–32)
Chloride: 105 mEq/L (ref 96–112)
Creatinine, Ser: 1.2 mg/dL (ref 0.4–1.2)
GFR: 45.92 mL/min — AB (ref >60.00)
GLUCOSE: 148 mg/dL — AB (ref 70–99)
POTASSIUM: 4.4 meq/L (ref 3.5–5.1)
Sodium: 140 mEq/L (ref 135–145)

## 2013-11-28 LAB — LIPID PANEL
CHOLESTEROL: 128 mg/dL (ref 0–200)
HDL: 46 mg/dL (ref >39.00)
LDL Cholesterol: 60 mg/dL (ref 0–99)
NONHDL: 82
Total CHOL/HDL Ratio: 3
Triglycerides: 111 mg/dL (ref 0.0–149.0)
VLDL: 22.2 mg/dL (ref 0.0–40.0)

## 2013-11-29 ENCOUNTER — Other Ambulatory Visit: Payer: Self-pay | Admitting: Gastroenterology

## 2013-12-19 ENCOUNTER — Other Ambulatory Visit: Payer: Self-pay | Admitting: Cardiovascular Disease

## 2013-12-24 ENCOUNTER — Ambulatory Visit: Payer: Self-pay | Admitting: Adult Health

## 2014-01-06 ENCOUNTER — Encounter: Payer: Self-pay | Admitting: Internal Medicine

## 2014-01-06 ENCOUNTER — Encounter: Payer: Self-pay | Admitting: *Deleted

## 2014-01-06 ENCOUNTER — Ambulatory Visit (INDEPENDENT_AMBULATORY_CARE_PROVIDER_SITE_OTHER): Payer: PRIVATE HEALTH INSURANCE | Admitting: *Deleted

## 2014-01-06 DIAGNOSIS — Z95810 Presence of automatic (implantable) cardiac defibrillator: Secondary | ICD-10-CM

## 2014-01-06 DIAGNOSIS — I255 Ischemic cardiomyopathy: Secondary | ICD-10-CM

## 2014-01-06 DIAGNOSIS — I2589 Other forms of chronic ischemic heart disease: Secondary | ICD-10-CM

## 2014-01-06 NOTE — Progress Notes (Signed)
EPIC Encounter for ICM Monitoring  Patient Name: Alexandra [name] is a 73 y.o. female Date: 01/06/2014 Primary Care Physican: Stephens Shire, MD Primary Cardiologist: Nahser Electrophysiologist: Allred Dry Weight: 182 lbs       In the past month, have you:  1. Gained more than 2 pounds in a day or more than 5 pounds in a week? Yes. She was out of town from 7/8-7/21. She does recall that sometime over the last month, that her weight went up to 186 lbs, but came back down. She is currently at her baseline.  2. Had changes in your medications (with verification of current medications)? no  3. Had more shortness of breath than is usual for you? no  4. Limited your activity because of shortness of breath? no  5. Not been able to sleep because of shortness of breath? no  6. Had increased swelling in your feet or ankles? Intermittent edema to her feet.  7. Had symptoms of dehydration (dizziness, dry mouth, increased thirst, decreased urine output) no  8. Had changes in sodium restriction? no  9. Been compliant with medication? Yes   ICM trend:   Follow-up plan: ICM clinic phone appointment: 02/06/14. Corvue readings have been intermittently elevated since May, however, her baseline impedence seems to be shifting as well. No changes made today as the patient is asymptomatic. I will forward readings to Dr. Acie Fredrickson and Dr. Rayann Heman for further recommendations.  Copy of note sent to patient's primary care physician, primary cardiologist, and device following physician.  Alvis Lemmings, RN, BSN 01/06/2014 2:34 PM

## 2014-01-07 ENCOUNTER — Encounter: Payer: Self-pay | Admitting: Adult Health

## 2014-01-07 ENCOUNTER — Ambulatory Visit (INDEPENDENT_AMBULATORY_CARE_PROVIDER_SITE_OTHER): Payer: Medicare Other | Admitting: Adult Health

## 2014-01-07 VITALS — BP 151/74 | HR 61 | Temp 98.1°F | Ht 63.0 in | Wt 184.0 lb

## 2014-01-07 DIAGNOSIS — G40309 Generalized idiopathic epilepsy and epileptic syndromes, not intractable, without status epilepticus: Secondary | ICD-10-CM

## 2014-01-07 MED ORDER — LEVETIRACETAM 500 MG PO TABS: 500.0000 mg | ORAL_TABLET | Freq: Two times a day (BID) | ORAL | Status: AC

## 2014-01-07 NOTE — Patient Instructions (Signed)
Seizure, Adult A seizure means there is unusual activity in the brain. A seizure can cause changes in attention or behavior. Seizures often cause shaking (convulsions). Seizures often last from 30 seconds to 2 minutes. HOME CARE   If you are given medicines, take them exactly as told by your doctor.  Keep all doctor visits as told.  Do not swim or drive until your doctor says it is okay.  Teach others what to do if you have a seizure. They should:  Lay you on the ground.  Put a cushion under your head.  Loosen any tight clothing around your neck.  Turn you on your side.  Stay with you until you get better. GET HELP RIGHT AWAY IF:   The seizure lasts longer than 2 to 5 minutes.  The seizure is very bad.  The person does not wake up after the seizure.  The person's attention or behavior changes. Drive the person to the emergency room or call your local emergency services (911 in [place] MAKE SURE YOU:   Understand these instructions.  Will watch your condition.  Will get help right away if you are not doing well or get worse. Document Released: 11/09/2007 Document Revised: 08/15/2011 Document Reviewed: 05/11/2011 ExitCare Patient Information 2015 [employer]. This information is not intended to replace advice given to you by your health care provider. Make sure you discuss any questions you have with your health care provider.  

## 2014-01-07 NOTE — Progress Notes (Signed)
PATIENT: Alexandra [name] DOB: Sep 19, 1940  REASON FOR VISIT: follow up HISTORY FROM: patient  HISTORY OF PRESENT ILLNESS: Alexandra [name] is a 73 year old female with a history of subdural hematomas and seizures. She returns today for follow-up. She is currently taking keppra 500 mg BID and is tolerating it well. She has not had any additional seizures since the last visit. She is currently not operating a motor vehicle. Patient does have some issues with ambulating due to her right knee. Patient states that she does well around the house but gets tired easily when she walks long distances. No new issues since last seen. Patient states that overall she has been feeling good.   08/23/13 HISTORY (CW): history of head trauma in the past while on Coumadin, with bilateral subdural hematomas that required craniotomies. The patient was on Dilantin at that time, but she had difficulty controlling the blood levels, and eventually she was switched to [place]. The patient was eventually tapered off of Keppra in 2013. The patient was last seen through this office on 01/18/2012. The last seizure was in the summer of 2012. The patient has done quite well since that time, but the patient was recently seen in emergency room when she found a knot on her head, without any known history of falling. A CT scan of the brain was done, showing no acute changes, but the contusion on the scalp was noted. The patient has done well until last evening when she had a witnessed seizure event. The patient had a seizure that started with head turning to the right, and she had generalized stiffening and jerking. The patient was confused and combative afterwards, and it took her about 30-45 minutes to become fully responsive. The patient has done well since that time. The patient did not bite her tongue or lose control of the bowels or the bladder. The patient returns to this office for an evaluation. The patient reports no new numbness or  weakness of the face, arms, or legs. The patient has some mild gait instability, but this is not a new issue. The patient denies any visual loss or significant problems with headache, although she does report an occasional headache.   REVIEW OF SYSTEMS: Full 14 system review of systems performed and notable only for:  Constitutional: N/A  Eyes: eye itching  Ear/Nose/Throat: N/A  Skin: N/A  Cardiovascular: leg swelling  Respiratory: cough  Gastrointestinal: N/A  Genitourinary: N/A Hematology/Lymphatic: N/A  Endocrine: N/A Musculoskeletal:joint pain, neck pain, neck stiffness, muscle cramps  Allergy/Immunology: N/A  Neurological: dizziness, headaches  Psychiatric: N/A Sleep: N/A   ALLERGIES: Allergies  Allergen Reactions  . Sulfa Antibiotics Rash    HOME MEDICATIONS: Outpatient Prescriptions Prior to Visit  Medication Sig Dispense Refill  . amLODipine (NORVASC) 5 MG tablet Take 5 mg by mouth 2 (two) times daily.       Marland Kitchen aspirin EC 81 MG tablet Take 81 mg by mouth daily.      Marland Kitchen atorvastatin (LIPITOR) 20 MG tablet Take 20 mg by mouth daily.      Marland Kitchen atorvastatin (LIPITOR) 20 MG tablet TAKE 1 TABLET BY MOUTH ONCE A DAY  90 tablet  0  . BYSTOLIC 10 MG tablet Take 10 mg by mouth 2 (two) times daily.       . ergocalciferol (VITAMIN D2) 50000 UNITS capsule Take 50,000 Units by mouth once a week. On wednesday      . hydrochlorothiazide (HYDRODIURIL) 25 MG tablet Take 1 tablet (25  mg total) by mouth daily.  90 tablet  3  . insulin lispro protamine-lispro (HUMALOG 75/25 MIX) (75-25) 100 UNIT/ML SUSP injection Inject 35 Units into the skin 2 (two) times daily with a meal.       . levETIRAcetam (KEPPRA) 500 MG tablet Take 500 mg by mouth 2 (two) times daily.      Marland Kitchen lisinopril (PRINIVIL,ZESTRIL) 10 MG tablet Take 1 tablet (10 mg total) by mouth every evening.  90 tablet  3  . metFORMIN (GLUCOPHAGE-XR) 500 MG 24 hr tablet Take 1,000 mg by mouth 2 (two) times daily.       . Multiple Vitamin  (MULTIVITAMIN WITH MINERALS) TABS tablet Take 1 tablet by mouth daily.      . pantoprazole (PROTONIX) 40 MG tablet Take 40 mg by mouth daily.      . potassium chloride (K-DUR) 10 MEQ tablet Take 1 tablet (10 mEq total) by mouth daily.  90 tablet  3  . sertraline (ZOLOFT) 100 MG tablet Take 100 mg by mouth daily.        Marland Kitchen acetaminophen (TYLENOL) 500 MG tablet Take 500 mg by mouth every 6 (six) hours as needed for mild pain.      . nitrofurantoin, macrocrystal-monohydrate, (MACROBID) 100 MG capsule Take 1 capsule by mouth daily.       No facility-administered medications prior to visit.    PAST MEDICAL HISTORY: Past Medical History  Diagnosis Date  . HTN (hypertension)   . CAD (coronary artery disease)     a. Remote CABG in 2008 in [place].;  b. LHC (06/2013):  pLAD 50 then 40, dOM occluded, pRCA 50, mRCA 95, dRCA occluded, RV marginal 70, L-LAD ok, S-dCFX ok, S-RCA ok; EF 45% with AL HK  . DM (diabetes mellitus)   . HTN (hypertension)   . PAF (paroxysmal atrial fibrillation)     a. Not on Coumadin due to history of falls and SDH  . GERD (gastroesophageal reflux disease)   . Venous insufficiency   . Left bundle branch block   . Obesity   . Hx of echocardiogram     a. Echo (06/2013):  Mod LVH, EF 50-55%, MAC, mild MR, mild LAE  . Ischemic cardiomyopathy     Myoview (06/2013):  Large infarct in apex and dAnt and sept wall, mild peri-infarct ischemia, EF 40%  . H/O sudden cardiac death successfully resuscitated     probably arrhythmic cause => ICD implanted  . S/P implantation of automatic cardioverter/defibrillator (AICD)     St Jude; Dr. Rayann Heman  . Carotid stenosis     Carotid US (07/2013):  Bilat ICA 1-39% (repeat 1 year).  . Generalized convulsive epilepsy without mention of intractable epilepsy 08/23/2013  . Vitamin D deficiency   . Polyneuropathy in diabetes(357.2)   . Seizure     last seizure 4 months ago  . Cancer 1980's    breast, radiation done, mass removed from left stomach area  was cancer also 1980's    PAST SURGICAL HISTORY: Past Surgical History  Procedure Laterality Date  . Partial hysterectomy      1985  . Total abdominal hysterectomy  1986  . Coronary artery bypass graft  2008    x3 2008.  [place] (Dr. Wende Neighbors[facility] [phone]  . Subdural hematoma evacuation via craniotomy  2012    bil x 2  . Implantable cardioverter defibrillator implant  06/14/13     [place] [phone] (serial Number [id number])  ICD implanted by Dr Rayann Heman for secondary prevention after an aborted sudden cardiac arrest  . Cholecystectomy  47's    FAMILY HISTORY: Family History  Problem Relation Age of Onset  . Emphysema Father   . Hypertension Father   . Hypertension Mother   . Stroke Mother   . Diabetes Mother   . Diabetes Brother   . Diabetes Sister     SOCIAL HISTORY: History   Social History  . Marital Status: Widowed    Spouse Name: N/A    Number of Children: 7  . Years of Education: 41 th   Occupational History  . Retired    Social History Main Topics  . Smoking status: Never Smoker   . Smokeless tobacco: Never Used  . Alcohol Use: No  . Drug Use: No  . Sexual Activity: Not on file   Other Topics Concern  . Not on file   Social History Narrative   Patient is right handed, resides alone      PHYSICAL EXAM  Filed Vitals:   01/07/14 0836  BP: 151/74  Pulse: 61  Temp: 98.1 F (36.7 C)  TempSrc: Oral  Height: 5\' 3"  (1.6 m)  Weight: 184 lb (83.462 kg)   Body mass index is 32.6 kg/(m^2).  Generalized: Well developed, in no acute distress   Neurological examination  Mentation: Alert oriented to time, place, history taking. Follows all commands speech and language fluent Cranial nerve II-XII:  Extraocular movements were full, visual field were full on confrontational test.  Motor: The motor testing reveals 5 over 5 strength of all 4 extremities. Good symmetric motor tone is noted throughout.    Sensory: Sensory testing is intact to soft touch on all 4 extremities. No evidence of extinction is noted.  Coordination: Cerebellar testing reveals good finger-nose-finger and heel-to-shin bilaterally.  Gait and station: Gait is normal. Tandem gait is slightly unsteady. Romberg is negative. No drift is seen.  Reflexes: Deep tendon reflexes are symmetric and normal bilaterally.    DIAGNOSTIC DATA (LABS, IMAGING, TESTING) - I reviewed patient records, labs, notes, testing and imaging myself where available.  Lab Results  Component Value Date   WBC 8.9 08/03/2013   HGB 11.2* 08/03/2013   HCT 33.0* 08/03/2013   MCV 90.4 08/03/2013   PLT 126* 08/03/2013      Component Value Date/Time   NA 140 11/28/2013 0850   K 4.4 11/28/2013 0850   CL 105 11/28/2013 0850   CO2 29 11/28/2013 0850   GLUCOSE 148* 11/28/2013 0850   BUN 24* 11/28/2013 0850   CREATININE 1.2 11/28/2013 0850   CALCIUM 9.3 11/28/2013 0850   PROT 6.5 11/28/2013 0850   ALBUMIN 3.8 11/28/2013 0850   AST 18 11/28/2013 0850   ALT 19 11/28/2013 0850   ALKPHOS 66 11/28/2013 0850   BILITOT 0.5 11/28/2013 0850   GFRNONAA 37* 08/03/2013 0457   GFRAA 42* 08/03/2013 0457   Lab Results  Component Value Date   CHOL 128 11/28/2013   HDL 46.00 11/28/2013   LDLCALC 60 11/28/2013   TRIG 111.0 11/28/2013   CHOLHDL 3 11/28/2013   Lab Results  Component Value Date   HGBA1C 7.5* 06/10/2013   Lab Results  Component Value Date   [id number] 756 04/21/2009   Lab Results  Component Value Date   TSH 2.297 06/10/2013      ASSESSMENT AND PLAN 73 y.o. year old female  has a past medical history of HTN (hypertension); CAD (coronary artery disease); DM (diabetes mellitus);  HTN (hypertension); PAF (paroxysmal atrial fibrillation); GERD (gastroesophageal reflux disease); Venous insufficiency; Left bundle branch block; Obesity; echocardiogram; Ischemic cardiomyopathy; H/O sudden cardiac death successfully resuscitated; S/P implantation of automatic  cardioverter/defibrillator (AICD); Carotid stenosis; Generalized convulsive epilepsy without mention of intractable epilepsy (08/23/2013); Vitamin D deficiency; Polyneuropathy in diabetes(357.2); Seizure; and Cancer (1980's). here with:  1. Seizures  Overall patient has done very well. She has been seizure free since the last visit. Patient should continue taking Keppra 500 mg twice a day. I will refill today. Patient is currently not operating a motor vehicle. If she continues to be seizure free for the next 3 months she can resume driving. Patient should followup in 3 months or sooner if needed.  Ward Givens, MSN, NP-C 01/07/2014, 8:43 AM [facility] [address], [place], [place] [address] [phone]  Note: This document was prepared with digital dictation and possible smart phrase technology. Any transcriptional errors that result from this process are unintentional.

## 2014-01-07 NOTE — Progress Notes (Signed)
I have read the note, and I agree with the clinical assessment and plan.  Camber Ninh KEITH   

## 2014-01-08 LAB — MDC_IDC_ENUM_SESS_TYPE_REMOTE
Battery Remaining Longevity: 76 mo
Battery Remaining Percentage: 93 %
Battery Voltage: 3.19 V
Brady Statistic AP VP Percent: 1 %
Brady Statistic AP VS Percent: 14 %
Brady Statistic RA Percent Paced: 14 %
Brady Statistic RV Percent Paced: 1 %
Date Time Interrogation Session: 20150803060017
HIGH POWER IMPEDANCE MEASURED VALUE: 47 Ohm
HighPow Impedance: 47 Ohm
Lead Channel Impedance Value: 390 Ohm
Lead Channel Impedance Value: 480 Ohm
Lead Channel Pacing Threshold Amplitude: 0.5 V
Lead Channel Pacing Threshold Amplitude: 1 V
Lead Channel Pacing Threshold Pulse Width: 0.5 ms
Lead Channel Sensing Intrinsic Amplitude: 11.8 mV
Lead Channel Setting Pacing Amplitude: 3.5 V
Lead Channel Setting Pacing Amplitude: 3.5 V
Lead Channel Setting Sensing Sensitivity: 0.5 mV
MDC IDC MSMT LEADCHNL RA SENSING INTR AMPL: 2.6 mV
MDC IDC MSMT LEADCHNL RV PACING THRESHOLD PULSEWIDTH: 0.5 ms
MDC IDC PG MODEL: 2357
MDC IDC PG SERIAL: 7150224
MDC IDC SET LEADCHNL RV PACING PULSEWIDTH: 0.5 ms
MDC IDC SET ZONE DETECTION INTERVAL: 350 ms
MDC IDC STAT BRADY AS VP PERCENT: 1 %
MDC IDC STAT BRADY AS VS PERCENT: 86 %
Zone Setting Detection Interval: 250 ms
Zone Setting Detection Interval: 300 ms

## 2014-01-08 NOTE — Progress Notes (Signed)
Remote ICD transmission.   

## 2014-01-14 NOTE — Progress Notes (Signed)
She is on HCTZ and Kdur She needs to avoid salt and salty foods. We can substitute lasix if her impedence continues to be low

## 2014-01-16 ENCOUNTER — Encounter: Payer: Self-pay | Admitting: Cardiology

## 2014-01-16 ENCOUNTER — Ambulatory Visit: Payer: Medicare Other

## 2014-01-20 ENCOUNTER — Ambulatory Visit: Payer: Medicare Other

## 2014-01-27 ENCOUNTER — Ambulatory Visit: Payer: Medicare Other

## 2014-01-28 ENCOUNTER — Telehealth: Payer: Self-pay | Admitting: Nurse Practitioner

## 2014-01-28 MED ORDER — FUROSEMIDE 40 MG PO TABS: 40.0000 mg | ORAL_TABLET | Freq: Every day | ORAL | Status: DC

## 2014-01-28 NOTE — Telephone Encounter (Signed)
Received notice from device clinic that patient has low impedence.   Per Dr. Acie Fredrickson: She is on HCTZ and Kdur  She needs to avoid salt and salty foods.  We can substitute lasix if her impedence continues to be low  Patient's EF from echo 1/15 is 50-55%.  I spoke with patient who states she has been feeling "strange" recently but it is difficult to explain; names it "uncomfortable", states it is not painful; denies heaviness.  Patient states sometimes when she walks to and from mailbox which is up a small incline she notes a difference in her breathing and feels more SOB.  Patient states she sometimes has lower extremity swelling; states none at present.  I reviewed patient's diet with her and she denies eating high sodium foods.  I advised patient I will forward to Dr. Acie Fredrickson for advice and will call her back with his advice.

## 2014-01-28 NOTE — Telephone Encounter (Signed)
Try Lasix 40 mg a day for 3 days.  Continue potassium . Re-interrogate next week.

## 2014-01-28 NOTE — Telephone Encounter (Signed)
Advised patient to take Lasix 40 mg x 3 days and then to expect call from device clinic for transmission on Monday.  I advised patient to continue K+.  Patient verbalized understanding and agreement. I am sending message to device clinic for re-interrogation Monday.

## 2014-02-03 ENCOUNTER — Telehealth: Payer: Self-pay | Admitting: Cardiovascular Disease

## 2014-02-03 ENCOUNTER — Ambulatory Visit: Payer: PRIVATE HEALTH INSURANCE | Admitting: *Deleted

## 2014-02-03 DIAGNOSIS — Z95810 Presence of automatic (implantable) cardiac defibrillator: Secondary | ICD-10-CM

## 2014-02-03 DIAGNOSIS — I255 Ischemic cardiomyopathy: Secondary | ICD-10-CM

## 2014-02-03 NOTE — Telephone Encounter (Signed)
New problem   Pt has questions about her transmission because of fluid retention. Please call pt.

## 2014-02-03 NOTE — Progress Notes (Addendum)
EPIC Encounter for ICM Monitoring  Patient Name: Alexandra [name] is a 73 y.o. female Date: 02/03/2014 Primary Care Physican: Stephens Shire, MD Primary Cardiologist: Nahser Electrophysiologist: Allred Dry Weight: 182 lbs       In the past month, have you:  1. Gained more than 2 pounds in a day or more than 5 pounds in a week? No. Her weight today is 180 lbs  2. Had changes in your medications (with verification of current medications)? Yes. Dr. Acie Fredrickson gave her lasix 40 mg x 3 days that she took on 8/26, 8/27 & 8/28. She did urinate more while taking this.  3. Had more shortness of breath than is usual for you? no  4. Limited your activity because of shortness of breath? no  5. Not been able to sleep because of shortness of breath? no  6. Had increased swelling in your feet or ankles? no  7. Had symptoms of dehydration (dizziness, dry mouth, increased thirst, decreased urine output) no  8. Had changes in sodium restriction? no  9. Been compliant with medication? Yes  ** The patient does report that starting on Saturday, she started feeling "swimmy headed." Her BP on Saturday (8/29) was 118/54, 8/30- 107/56 & 110/62, and today she is 126/63. In reviewing the patient's chart, she typically will run 638-466'Z systolic. I advised her that her reduction in her BP may be due to the volume she lost with the addition of her lasix. She is still feeling some weakness today though her BP is up a little. I have explained that her readings have historically been a little elevated, but the current readings are good. She will continue to monitor her weights and BP readings. She will call should she still have weakness and feel "swimmy headed" at the end of the week. **   ICM trend:   Follow-up plan: ICM clinic phone appointment: 03/06/14. Optivol readings are stable. Will forward to Dr. Acie Fredrickson and Dr. Rayann Heman for review. I advised the patient I will call her back with any further recommendations, but  to keep track of her weights and call if he weight goes up more than 2 lbs in 24 hours or 5 lbs in a week.   Copy of note sent to patient's primary care physician, primary cardiologist, and device following physician.  Alvis Lemmings, RN, BSN 02/03/2014 3:28 PM  Most recent abrupt rise in impedence is consistent with her being a little dry presently.  Hopefully this will settle out now that she is back on her baseline medicine.  Alexandra [name] just do a spot check of her coreview in a few days.  As long as it is not still climbing, just follow.

## 2014-02-03 NOTE — Telephone Encounter (Signed)
I spoke with the patient regarding her ICM transmission and fluids. See ICM note from 8/31. The patient also stated she would be sending a gas form to our office that Medicare/ Medicaid is requiring from [employer]. We need to state that there are no specialty services in [facility] and that is why she has to travel to [facility] for her care. I told her we would help her with this and that she could mail it to my attention or Michelle's attention.

## 2014-02-03 NOTE — Telephone Encounter (Signed)
ICM transmission received. I left a message for the patient to call me in the [place] office today [phone]). Fluid trends stable, but need to follow up on symptoms.

## 2014-02-06 ENCOUNTER — Telehealth: Payer: Self-pay | Admitting: *Deleted

## 2014-02-06 ENCOUNTER — Encounter: Payer: PRIVATE HEALTH INSURANCE | Admitting: *Deleted

## 2014-02-06 NOTE — Telephone Encounter (Signed)
Dr. Rayann Heman reviewed the patient's ICM transmission from 02/03/14.  Comments below:   " Most recent abrupt rise in impedence is consistent with her being a little dry presently. Hopefully this will settle out now that she is back on her baseline medicine. Sheetal Lyall just do a spot check of her coreview in a few days. As long as it is not still climbing, just follow. "  I have spoken with the patient and will have her transmit on Tuesday. She states she will need to be walked through this. I will call her next week and help her with this. Alvis Lemmings, RN, BSN

## 2014-02-11 ENCOUNTER — Ambulatory Visit (INDEPENDENT_AMBULATORY_CARE_PROVIDER_SITE_OTHER): Payer: PRIVATE HEALTH INSURANCE | Admitting: *Deleted

## 2014-02-11 DIAGNOSIS — I2589 Other forms of chronic ischemic heart disease: Secondary | ICD-10-CM

## 2014-02-11 DIAGNOSIS — Z95810 Presence of automatic (implantable) cardiac defibrillator: Secondary | ICD-10-CM

## 2014-02-11 NOTE — Progress Notes (Signed)
EPIC Encounter for ICM Monitoring  Patient Name: Alexandra [name] is a 73 y.o. female Date: 02/11/2014 Primary Care Physican: Stephens Shire, MD Primary Cardiologist: Nahser Electrophysiologist: Allred Dry Weight: 182 lbs       In the past month, have you:  1. Gained more than 2 pounds in a day or more than 5 pounds in a week? no  2. Had changes in your medications (with verification of current medications)? no  3. Had more shortness of breath than is usual for you? No. She does have SOB intermittently though, which she has been having.  4. Limited your activity because of shortness of breath? no  5. Not been able to sleep because of shortness of breath? no  6. Had increased swelling in your feet or ankles? no  7. Had symptoms of dehydration (dizziness, dry mouth, increased thirst, decreased urine output) no  8. Had changes in sodium restriction? no  9. Been compliant with medication? Yes   ICM trend:   Follow-up plan: ICM clinic phone appointment: 02/20/14. Follow up transmission done today per Dr. Rayann Heman. The patient was given lasix 40 mg daily x 3 days starting 01/28/14. She had an abrupt rise in her corvue reading just after that indicating she may have been a little dry. Recheck done today to follow up. Impedence is down today showing that she may be retaining fluid again. I advised the patient to take lasix 20 mg daily x 3 days. She is also complaining of still feeling a little "swimmy headed," although she reports BP readings to me today from 118-123/53-93. I advised her I would forward to Dr. Acie Fredrickson to review since she is still feeling a little light headed with these readings. I will follow up on her Corvue trends in 1 week.  Copy of note sent to patient's primary care physician, primary cardiologist, and device following physician.  Alvis Lemmings, RN, BSN 02/11/2014 10:58 AM

## 2014-02-14 ENCOUNTER — Encounter (HOSPITAL_COMMUNITY): Payer: Self-pay | Admitting: *Deleted

## 2014-02-26 ENCOUNTER — Other Ambulatory Visit: Payer: Self-pay | Admitting: Gastroenterology

## 2014-02-27 ENCOUNTER — Ambulatory Visit (HOSPITAL_COMMUNITY): Payer: PRIVATE HEALTH INSURANCE | Admitting: Anesthesiology

## 2014-02-27 ENCOUNTER — Encounter (HOSPITAL_COMMUNITY): Payer: Self-pay | Admitting: *Deleted

## 2014-02-27 ENCOUNTER — Ambulatory Visit (HOSPITAL_COMMUNITY)
Admission: RE | Admit: 2014-02-27 | Discharge: 2014-02-27 | Disposition: A | Discharge status: Home / Self Care | Payer: PRIVATE HEALTH INSURANCE | Source: Ambulatory Visit | Attending: Gastroenterology | Admitting: Gastroenterology

## 2014-02-27 ENCOUNTER — Encounter (HOSPITAL_COMMUNITY)
Admission: RE | Disposition: A | Discharge status: Home / Self Care | Payer: Self-pay | Source: Ambulatory Visit | Attending: Gastroenterology

## 2014-02-27 ENCOUNTER — Encounter (HOSPITAL_COMMUNITY): Payer: PRIVATE HEALTH INSURANCE | Admitting: Anesthesiology

## 2014-02-27 ENCOUNTER — Telehealth: Payer: Self-pay | Admitting: Nurse Practitioner

## 2014-02-27 DIAGNOSIS — K5730 Diverticulosis of large intestine without perforation or abscess without bleeding: Secondary | ICD-10-CM | POA: Diagnosis not present

## 2014-02-27 DIAGNOSIS — E669 Obesity, unspecified: Secondary | ICD-10-CM | POA: Insufficient documentation

## 2014-02-27 DIAGNOSIS — Z860100 Personal history of colon polyps, unspecified: Secondary | ICD-10-CM | POA: Insufficient documentation

## 2014-02-27 DIAGNOSIS — Z95810 Presence of automatic (implantable) cardiac defibrillator: Secondary | ICD-10-CM | POA: Insufficient documentation

## 2014-02-27 DIAGNOSIS — Z794 Long term (current) use of insulin: Secondary | ICD-10-CM | POA: Insufficient documentation

## 2014-02-27 DIAGNOSIS — E559 Vitamin D deficiency, unspecified: Secondary | ICD-10-CM | POA: Diagnosis not present

## 2014-02-27 DIAGNOSIS — I10 Essential (primary) hypertension: Secondary | ICD-10-CM | POA: Insufficient documentation

## 2014-02-27 DIAGNOSIS — Z882 Allergy status to sulfonamides status: Secondary | ICD-10-CM | POA: Diagnosis not present

## 2014-02-27 DIAGNOSIS — I4891 Unspecified atrial fibrillation: Secondary | ICD-10-CM | POA: Insufficient documentation

## 2014-02-27 DIAGNOSIS — Z853 Personal history of malignant neoplasm of breast: Secondary | ICD-10-CM | POA: Insufficient documentation

## 2014-02-27 DIAGNOSIS — F411 Generalized anxiety disorder: Secondary | ICD-10-CM | POA: Diagnosis not present

## 2014-02-27 DIAGNOSIS — I447 Left bundle-branch block, unspecified: Secondary | ICD-10-CM | POA: Diagnosis not present

## 2014-02-27 DIAGNOSIS — Z79899 Other long term (current) drug therapy: Secondary | ICD-10-CM | POA: Diagnosis not present

## 2014-02-27 DIAGNOSIS — Z1211 Encounter for screening for malignant neoplasm of colon: Secondary | ICD-10-CM | POA: Insufficient documentation

## 2014-02-27 DIAGNOSIS — E1149 Type 2 diabetes mellitus with other diabetic neurological complication: Secondary | ICD-10-CM | POA: Diagnosis not present

## 2014-02-27 DIAGNOSIS — Z951 Presence of aortocoronary bypass graft: Secondary | ICD-10-CM | POA: Diagnosis not present

## 2014-02-27 DIAGNOSIS — G40909 Epilepsy, unspecified, not intractable, without status epilepticus: Secondary | ICD-10-CM | POA: Insufficient documentation

## 2014-02-27 DIAGNOSIS — Z7982 Long term (current) use of aspirin: Secondary | ICD-10-CM | POA: Insufficient documentation

## 2014-02-27 DIAGNOSIS — I2510 Atherosclerotic heart disease of native coronary artery without angina pectoris: Secondary | ICD-10-CM | POA: Insufficient documentation

## 2014-02-27 DIAGNOSIS — K219 Gastro-esophageal reflux disease without esophagitis: Secondary | ICD-10-CM | POA: Insufficient documentation

## 2014-02-27 DIAGNOSIS — E1142 Type 2 diabetes mellitus with diabetic polyneuropathy: Secondary | ICD-10-CM | POA: Insufficient documentation

## 2014-02-27 DIAGNOSIS — I2589 Other forms of chronic ischemic heart disease: Secondary | ICD-10-CM | POA: Insufficient documentation

## 2014-02-27 DIAGNOSIS — D126 Benign neoplasm of colon, unspecified: Secondary | ICD-10-CM | POA: Insufficient documentation

## 2014-02-27 HISTORY — DX: Presence of automatic (implantable) cardiac defibrillator: Z95.810

## 2014-02-27 HISTORY — PX: COLONOSCOPY WITH PROPOFOL: SHX5780

## 2014-02-27 HISTORY — DX: Anxiety disorder, unspecified: F41.9

## 2014-02-27 LAB — GLUCOSE, CAPILLARY
Glucose-Capillary: 188 mg/dL — ABNORMAL HIGH (ref 70–99)
Glucose-Capillary: 183 mg/dL — ABNORMAL HIGH (ref 70–99)

## 2014-02-27 SURGERY — COLONOSCOPY WITH PROPOFOL
Anesthesia: Monitor Anesthesia Care

## 2014-02-27 MED ORDER — PROPOFOL 10 MG/ML IV BOLUS
INTRAVENOUS | Status: AC
  Filled 2014-02-27: qty 20

## 2014-02-27 MED ORDER — SODIUM CHLORIDE 0.9 % IV SOLN: INTRAVENOUS | Status: DC

## 2014-02-27 MED ORDER — PROPOFOL 10 MG/ML IV BOLUS
INTRAVENOUS | Status: AC
Start: 2014-02-27 — End: 2014-02-27
  Filled 2014-02-27: qty 20

## 2014-02-27 MED ORDER — LACTATED RINGERS IV SOLN
INTRAVENOUS | Status: DC
  Administered 2014-02-27: 1000 mL via INTRAVENOUS

## 2014-02-27 MED ORDER — PROPOFOL 10 MG/ML IV BOLUS
INTRAVENOUS | Status: DC | PRN
Start: 2014-02-27 — End: 2014-02-27
  Administered 2014-02-27: 20 mg via INTRAVENOUS
  Administered 2014-02-27: 40 mg via INTRAVENOUS
  Administered 2014-02-27: 20 mg via INTRAVENOUS

## 2014-02-27 SURGICAL SUPPLY — 21 items

## 2014-02-27 NOTE — Telephone Encounter (Signed)
Left message on patient's personal voice mail that per Dr. Acie Fredrickson BPs look great and patient may continue same meds.  I advised in message to call office with questions or concerns.

## 2014-02-27 NOTE — Telephone Encounter (Signed)
The BPs look great. Continue same meds.

## 2014-02-27 NOTE — Telephone Encounter (Signed)
Received BP readings by mail from patient: The patient's blood pressures and pulse rates are fairly consistent.  The following is a sampling of the highest pressures of time frame 8/29 through 9/18. 8/29 118/54, HR 70 8/31 126/63, HR 61 9/5 150/71, HR 61 9/8 126/63, HR 58 9/11 125/68, HR 64 9/18 129/66, HR 61  Patient's BP generally ranges from 935 to 521 systolic and 50 to 60 diastolic.  I am forwarding to Dr. Acie Fredrickson for his review.

## 2014-02-27 NOTE — H&P (Signed)
Alexandra [name] is an 73 y.o. female.   Chief Complaint: Colorectal cancer screening. HPI: Patient is here for a colonoscopy. See office notes  For details.  Past Medical History  Diagnosis Date  . HTN (hypertension)   . CAD (coronary artery disease)     a. Remote CABG in 2008 in [place].;  b. LHC (06/2013):  pLAD 50 then 40, dOM occluded, pRCA 50, mRCA 95, dRCA occluded, RV marginal 70, L-LAD ok, S-dCFX ok, S-RCA ok; EF 45% with AL HK  . DM (diabetes mellitus)   . HTN (hypertension)   . PAF (paroxysmal atrial fibrillation)     a. Not on Coumadin due to history of falls and SDH  . GERD (gastroesophageal reflux disease)   . Venous insufficiency   . Left bundle branch block   . Obesity   . Hx of echocardiogram     a. Echo (06/2013):  Mod LVH, EF 50-55%, MAC, mild MR, mild LAE  . Ischemic cardiomyopathy     Myoview (06/2013):  Large infarct in apex and dAnt and sept wall, mild peri-infarct ischemia, EF 40%  . H/O sudden cardiac death successfully resuscitated     probably arrhythmic cause => ICD implanted  . S/P implantation of automatic cardioverter/defibrillator (AICD)     [employer]; Dr. Rayann Heman  . Carotid stenosis     Carotid US (07/2013):  Bilat ICA 1-39% (repeat 1 year).  . Generalized convulsive epilepsy without mention of intractable epilepsy 08/23/2013  . Vitamin D deficiency   . Polyneuropathy in diabetes(357.2)   . Anxiety   . Seizure     last seizure march 2015  . Cancer 1980's, left breast    breast, radiation done, mass removed from left stomach area was cancer also 1980's  . Automatic implantable cardioverter-defibrillator in situ    Past Surgical History  Procedure Laterality Date  . Partial hysterectomy      1985, 1986 hysterectomy completed  . Total abdominal hysterectomy  1986  . Subdural hematoma evacuation via craniotomy  2012    bil x 2  . Implantable cardioverter defibrillator implant  06/14/13     [place] [address] model [id number] (serial Number  [id number]) ICD implanted by Dr Rayann Heman for secondary prevention after an aborted sudden cardiac arrest  . Cholecystectomy  1980's  . Coronary artery bypass graft  2008    x3 2008.  [place] (Dr. Wende Neighbors) [facility] [phone]   Family History  Problem Relation Age of Onset  . Emphysema Father   . Hypertension Father   . Hypertension Mother   . Stroke Mother   . Diabetes Mother   . Diabetes Brother   . Diabetes Sister    Social History:  reports that she has never smoked. She has never used smokeless tobacco. She reports that she does not drink alcohol or use illicit drugs.  Allergies:  Allergies  Allergen Reactions  . Sulfa Antibiotics Rash   Medications Prior to Admission  Medication Sig Dispense Refill  . amLODipine (NORVASC) 5 MG tablet Take 5 mg by mouth 2 (two) times daily.       Marland Kitchen aspirin EC 81 MG tablet Take 81 mg by mouth daily.      Marland Kitchen atorvastatin (LIPITOR) 20 MG tablet Take 20 mg by mouth daily.      Marland Kitchen BYSTOLIC 10 MG tablet Take 10 mg by mouth 2 (two) times daily.       . ergocalciferol (VITAMIN D2) 50000 UNITS capsule Take 50,000  Units by mouth once a week. On wednesday      . furosemide (LASIX) 40 MG tablet Take 40 mg by mouth as needed.      . hydrochlorothiazide (HYDRODIURIL) 25 MG tablet Take 1 tablet (25 mg total) by mouth daily.  90 tablet  3  . insulin lispro protamine-lispro (HUMALOG 75/25 MIX) (75-25) 100 UNIT/ML SUSP injection Inject 35 Units into the skin 2 (two) times daily with a meal. 40 units in am and 35 in pm      . levETIRAcetam (KEPPRA) 500 MG tablet Take 1 tablet (500 mg total) by mouth 2 (two) times daily.  180 tablet  3  . lisinopril (PRINIVIL,ZESTRIL) 10 MG tablet Take 1 tablet (10 mg total) by mouth every evening.  90 tablet  3  . metFORMIN (GLUCOPHAGE-XR) 500 MG 24 hr tablet Take 500 mg by mouth 2 (two) times daily.       . Multiple Vitamin (MULTIVITAMIN WITH MINERALS) TABS tablet Take 1 tablet by mouth daily.      . pantoprazole  (PROTONIX) 40 MG tablet Take 40 mg by mouth every evening.       . potassium chloride (K-DUR) 10 MEQ tablet Take 1 tablet (10 mEq total) by mouth daily.  90 tablet  3  . sertraline (ZOLOFT) 100 MG tablet Take 100 mg by mouth every morning.       Marland Kitchen atorvastatin (LIPITOR) 20 MG tablet TAKE 1 TABLET BY MOUTH ONCE A DAY  90 tablet  0   Results for orders placed during the hospital encounter of 02/27/14 (from the past 48 hour(s))  GLUCOSE, CAPILLARY     Status: Abnormal   Collection Time    02/27/14  8:40 AM      Result Value Ref Range   Glucose-Capillary 188 (*) 70 - 99 mg/dL   No results found.  Review of Systems  Constitutional: Negative.   HENT: Negative.   Eyes: Negative.   Respiratory: Negative.   Cardiovascular: Negative.   Gastrointestinal: Positive for heartburn.  Musculoskeletal: Positive for joint pain.  Skin: Negative.   Neurological: Positive for seizures.  Psychiatric/Behavioral: Positive for depression.   Blood pressure 162/58, temperature 97.8 F (36.6 C), temperature source Oral, resp. rate 16, height 5\' 3"  (1.6 m), weight 81.647 kg (180 lb), SpO2 99.00%. Physical Exam  Constitutional: She is oriented to person, place, and time. She appears well-developed and well-nourished.  HENT:  Head: Normocephalic and atraumatic.  Eyes: Conjunctivae and EOM are normal. Pupils are equal, round, and reactive to light.  Neck: Normal range of motion. Neck supple.  Cardiovascular: Normal rate and regular rhythm.   Respiratory: Effort normal and breath sounds normal.  GI: Soft. Bowel sounds are normal.  Musculoskeletal: Normal range of motion.  Neurological: She is alert and oriented to person, place, and time.  Skin: Skin is warm and dry.  Psychiatric: She has a normal mood and affect. Her behavior is normal. Judgment and thought content normal.    Assessment/Plan Colrectal cancer screening; proceed with a colonoscopy at this time.  Obediah Welles 02/27/2014, 9:32 AM

## 2014-02-27 NOTE — Discharge Instructions (Addendum)

## 2014-02-27 NOTE — Transfer of Care (Signed)
Immediate Anesthesia Transfer of Care Note  Patient: Alexandra [name]  Procedure(s) Performed: Procedure(s): COLONOSCOPY WITH PROPOFOL (N/A)  Patient Location: PACU and Endoscopy Unit  Anesthesia Type:MAC  Level of Consciousness: awake, alert , oriented and patient cooperative  Airway & Oxygen Therapy: Patient Spontanous Breathing and Patient connected to face mask oxygen  Post-op Assessment: Report given to PACU RN, Post -op Vital signs reviewed and stable and Patient moving all extremities  Post vital signs: Reviewed and stable  Complications: No apparent anesthesia complications

## 2014-02-27 NOTE — Anesthesia Preprocedure Evaluation (Signed)
Anesthesia Evaluation  Patient identified by MRN, date of birth, ID band Patient awake    Reviewed: Allergy & Precautions, H&P , NPO status , Patient's Chart, lab work & pertinent test results  Airway Mallampati: II TM Distance: >3 FB Neck ROM: Full    Dental no notable dental hx. (+) Upper Dentures, Edentulous Upper   Pulmonary neg pulmonary ROS,  breath sounds clear to auscultation  Pulmonary exam normal       Cardiovascular hypertension, Pt. on medications + CAD and + CABG (2008) + dysrhythmias + Cardiac Defibrillator Rhythm:Regular Rate:Normal  LBBB   Neuro/Psych Seizures -, Well Controlled,  negative psych ROS   GI/Hepatic negative GI ROS, Neg liver ROS,   Endo/Other  diabetes, Type 2, Oral Hypoglycemic Agents, Insulin Dependent  Renal/GU negative Renal ROS  negative genitourinary   Musculoskeletal negative musculoskeletal ROS (+)   Abdominal   Peds negative pediatric ROS (+)  Hematology negative hematology ROS (+)   Anesthesia Other Findings   Reproductive/Obstetrics negative OB ROS                           Anesthesia Physical Anesthesia Plan  ASA: III  Anesthesia Plan: MAC   Post-op Pain Management:    Induction:   Airway Management Planned: Simple Face Mask  Additional Equipment:   Intra-op Plan:   Post-operative Plan: Extubation in OR  Informed Consent: I have reviewed the patients History and Physical, chart, labs and discussed the procedure including the risks, benefits and alternatives for the proposed anesthesia with the patient or authorized representative who has indicated his/her understanding and acceptance.   Dental advisory given  Plan Discussed with: CRNA  Anesthesia Plan Comments:         Anesthesia Quick Evaluation

## 2014-02-27 NOTE — Op Note (Signed)
[facility] [place], [address]   OPERATIVE PROCEDURE REPORT  PATIENT: Alexandra [name], Alexandra [name]  MR#: [id number] BIRTHDATE: 12/02/1940 GENDER: female ENDOSCOPIST: Edmonia James, MD ASSISTANT:   Jiles Harold technician Carolynn Comment, RN PROCEDURE DATE: 02/27/2014 PRE-PROCEDURE PREPARATION: The patient was prepped with a gallon of Golytely the night prior to the procedure.  The patient was fasted for 8 hours prior to the procedure. PRE-PROCEDURE PHYSICAL: Patient has stable vital signs. Neck is supple. There is no JVD, thyromegaly or LAD. Chest clear to auscultation.  S1 and S2 regular.  Abdomen soft, non-distended, non-tender with NABS. PROCEDURE:     Colonoscopy with cold biopsy x 1 and hot snare polypectomy x  1 and application of a resolution clip. ASA CLASS:     Class IV INDICATIONS:     1. Colorectal cancer screening.2. Personal history of adenomatous polyps. MEDICATIONS:     Propofol 120 mg IV.  DESCRIPTION OF PROCEDURE: After the risks, benefits, and alternatives of the procedure were thoroughly explained [including a 10% missed rate of cancer and polyps], informed consent was obtained. Digital rectal exam was performed.  The Pentax Ped Colon [id number]  was introduced through the anus  and advanced to the cecum, which was identified by both the appendix and ileocecal valve , limited by No adverse events experienced. The quality of the prep was fair. . Multiple washes were done. Small lesions could be missed. The instrument was then slowly withdrawn as the colon was fully examined.     COLON FINDINGS: There was mild diverticulosis noted throughout the colon. A small sessile polyp was found in the proximal right colon and was removed by cold biopsy x 1. A large pedunculated polyp(s) were found in the rectosigmoid colon about 9-10 mm in size. This was removed by hot polypectomy was performed using snare cautery x 1-200/20. One resolution  clip was applied to achieve hemostasis at the polypectomy site. The rest of the colonic mucosa appeared healthy with a normal vascular pattern. No masses or AVMs were noted. he appendiceal orifice and the ICV were identified and photographed. Retroflexed views revealed no abnormalities.  The patient tolerated the procedure without immediate complications. The scope was then withdrawn from the patient and the procedure terminated.  TIME TO CECUM:   04 minutes 00 seconds WITHDRAW TIME:  14 minutes 00 seconds  IMPRESSION:     1) There was mild diverticulosis noted throughtouth the colon. 2) One dimunitive polyp in the proximal right colon removed by cold biopsy x 1. 3) One 9-10 mm pedunculated polyp removed by hot snare x 1 from 20 cm; resolution clip applied to achieve hemostasis.  RECOMMENDATIONS:     1.  Hold Aspirin and all NSAIDS for 2 weeks. 2.  Await pathology results. 3.  Continue current medications. 4.  Continue surveillance 5.  High fiber diet with liberal fluid intake. 6.  OP follow-up is advised on a PRN basis.  REPEAT EXAM:      In 3-5 years  for a repeat colonoscopy.  If the patient has any abnormal GI symptoms in the interim, she have been advised to contact the office as soon as possible for further recommendations.   CPT CODES:     [id number], Colonoscopy with Polypectomy (Snare)  DIAGNOSIS CODES:     211.3 Colonic polyps 562.10 Diverticulosis V12.72 Personal history of colonic polyps V76.51 Colorectal cancer screening    REFERRED VZ:DGLOV Tollie Pizza, M.D.  eSigned:  Edmonia James, MD 02/27/2014 10:28 AM  PATIENT NAME:  Alexandra [name], Alexandra [name] MR#: [id number]

## 2014-02-28 ENCOUNTER — Encounter (HOSPITAL_COMMUNITY): Payer: Self-pay | Admitting: Gastroenterology

## 2014-02-28 NOTE — Anesthesia Postprocedure Evaluation (Signed)
  Anesthesia Post-op Note  Patient: Alexandra [name]  Procedure(s) Performed: Procedure(s) (LRB): COLONOSCOPY WITH PROPOFOL (N/A)  Patient Location: PACU  Anesthesia Type: MAC  Level of Consciousness: awake and alert   Airway and Oxygen Therapy: Patient Spontanous Breathing  Post-op Pain: mild  Post-op Assessment: Post-op Vital signs reviewed, Patient's Cardiovascular Status Stable, Respiratory Function Stable, Patent Airway and No signs of Nausea or vomiting  Last Vitals:  Filed Vitals:   02/27/14 1040  BP: 160/59  Pulse: 62  Temp:   Resp: 18    Post-op Vital Signs: stable   Complications: No apparent anesthesia complications

## 2014-03-04 ENCOUNTER — Telehealth: Payer: Self-pay | Admitting: Cardiovascular Disease

## 2014-03-04 NOTE — Telephone Encounter (Signed)
New message       Pt has a question about her defibulator

## 2014-03-04 NOTE — Telephone Encounter (Signed)
Pt stated that she had a colonoscopy on Thursday and had to have a magnet placed over defib. She has felt uneasy about this since she had the procedure. She stated that she has not had any shortness of breath or light headiness. But she has had a little slight pain. I requested that pt send manual transmission. She verbalized understanding.

## 2014-03-04 NOTE — Telephone Encounter (Signed)
Informed pt that device tech looked at transmission and everything was normal. Pt stated that the site is not red / swollen / or hot to touch. I informed pt that if chest pain continued / got worse to call or primary cardiologist or go to ER. She verbalized understanding.

## 2014-03-06 ENCOUNTER — Encounter: Payer: Self-pay | Admitting: *Deleted

## 2014-03-06 ENCOUNTER — Ambulatory Visit (INDEPENDENT_AMBULATORY_CARE_PROVIDER_SITE_OTHER): Payer: PRIVATE HEALTH INSURANCE | Admitting: *Deleted

## 2014-03-06 DIAGNOSIS — Z95810 Presence of automatic (implantable) cardiac defibrillator: Secondary | ICD-10-CM

## 2014-03-06 DIAGNOSIS — I255 Ischemic cardiomyopathy: Secondary | ICD-10-CM

## 2014-03-06 NOTE — Progress Notes (Signed)
EPIC Encounter for ICM Monitoring  Patient Name: Ajayla Iglesias is a 73 y.o. female Date: 03/06/2014 Primary Care Physican: Stephens Shire, MD Primary Cardiologist: Nahser Electrophysiologist: Allred Dry Weight: 180 lbs       In the past month, have you:  1. Gained more than 2 pounds in a day or more than 5 pounds in a week? no  2. Had changes in your medications (with verification of current medications)? Yes. Corvue readings were up in early September. The patient was given lasix 20 mg daily x 3 days.   3. Had more shortness of breath than is usual for you? no  4. Limited your activity because of shortness of breath? no  5. Not been able to sleep because of shortness of breath? no  6. Had increased swelling in your feet or ankles? no  7. Had symptoms of dehydration (dizziness, dry mouth, increased thirst, decreased urine output) no  8. Had changes in sodium restriction? no  9. Been compliant with medication? Yes   ICM trend:   Follow-up plan: ICM clinic phone appointment: 04/09/14- full transmission  Copy of note sent to patient's primary care physician, primary cardiologist, and device following physician.  Alvis Lemmings, RN, BSN 03/06/2014 12:23 PM

## 2014-04-03 ENCOUNTER — Other Ambulatory Visit: Payer: Self-pay

## 2014-04-05 ENCOUNTER — Telehealth: Payer: Self-pay

## 2014-04-07 NOTE — Telephone Encounter (Signed)
Sharyn Lull, Will you call her and ask if she is taking lasix.  My last note does not mention it.  Thanks

## 2014-04-07 NOTE — Telephone Encounter (Signed)
Spoke with patient who states she only took Furosemide as directed a few weeks ago for 3-4 days.  Patient states she has plenty of the medication and some refills but she is not in need of any at this time.  I advised patient that I will send a note to her pharmacy.  Patient verbalized understanding and agreement.

## 2014-04-09 ENCOUNTER — Encounter: Payer: Self-pay | Admitting: Adult Health

## 2014-04-09 ENCOUNTER — Telehealth: Payer: Self-pay | Admitting: Cardiology

## 2014-04-09 ENCOUNTER — Ambulatory Visit (INDEPENDENT_AMBULATORY_CARE_PROVIDER_SITE_OTHER): Payer: Medicare Other | Admitting: Adult Health

## 2014-04-09 ENCOUNTER — Encounter: Payer: PRIVATE HEALTH INSURANCE | Admitting: *Deleted

## 2014-04-09 VITALS — BP 137/67 | HR 69 | Ht 63.5 in | Wt 189.0 lb

## 2014-04-09 DIAGNOSIS — G40309 Generalized idiopathic epilepsy and epileptic syndromes, not intractable, without status epilepticus: Secondary | ICD-10-CM

## 2014-04-09 NOTE — Patient Instructions (Signed)
Seizure, Adult A seizure means there is unusual activity in the brain. A seizure can cause changes in attention or behavior. Seizures often cause shaking (convulsions). Seizures often last from 30 seconds to 2 minutes. HOME CARE   If you are given medicines, take them exactly as told by your doctor.  Keep all doctor visits as told.  Do not swim or drive until your doctor says it is okay.  Teach others what to do if you have a seizure. They should:  Lay you on the ground.  Put a cushion under your head.  Loosen any tight clothing around your neck.  Turn you on your side.  Stay with you until you get better. GET HELP RIGHT AWAY IF:   The seizure lasts longer than 2 to 5 minutes.  The seizure is very bad.  The person does not wake up after the seizure.  The person's attention or behavior changes. Drive the person to the emergency room or call your local emergency services (911 in [place] MAKE SURE YOU:   Understand these instructions.  Will watch your condition.  Will get help right away if you are not doing well or get worse. Document Released: 11/09/2007 Document Revised: 08/15/2011 Document Reviewed: 05/11/2011 ExitCare Patient Information 2015 [employer]. This information is not intended to replace advice given to you by your health care provider. Make sure you discuss any questions you have with your health care provider.  

## 2014-04-09 NOTE — Progress Notes (Signed)
I have read the note, and I agree with the clinical assessment and plan.  WILLIS,CHARLES KEITH   

## 2014-04-09 NOTE — Progress Notes (Signed)
PATIENT: Alexandra [name] DOB: 19-Feb-1941  REASON FOR VISIT: follow up HISTORY FROM: patient  HISTORY OF PRESENT ILLNESS: [name] is a 73 year old female with a history of seizures. She returns today for follow-up. She is currently taking Keppra 500 mg BID and tolerating it well. She reports that she has not has any seizures since the last visit. 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  She is able to complete all ADLs independently.  No changes in gait and balance. Denies any recent falls. No new neurological complaints. No new medical issues.  HISTORY 01/07/14: 73 year old female with a history of subdural hematomas and seizures. She returns today for follow-up. She is currently taking keppra 500 mg BID and is tolerating it well. She has not had any additional seizures since the last visit. She is currently not operating a motor vehicle. Patient does have some issues with ambulating due to her right knee. Patient states that she does well around the house but gets tired easily when she walks long distances. No new issues since last seen. Patient states that overall she has been feeling good.   REVIEW OF SYSTEMS: Full 14 system review of systems performed and notable only for:  Constitutional: N/A  Eyes: N/A Ear/Nose/Throat: N/A  Skin: N/A  Cardiovascular: leg swelling Respiratory: cough Gastrointestinal: abdominal pain Genitourinary: N/A Hematology/Lymphatic:  Bruise easily Endocrine: N/A Musculoskeletal: Muscle cramps Allergy/Immunology: N/A  Neurological: headache Psychiatric: N/A Sleep: restless leg   ALLERGIES: Allergies  Allergen Reactions  . Sulfa Antibiotics Rash    HOME MEDICATIONS: Outpatient Prescriptions Prior to Visit  Medication Sig Dispense Refill  . amLODipine (NORVASC) 5 MG tablet Take 5 mg by mouth 2 (two) times daily.     Marland Kitchen atorvastatin (LIPITOR) 20 MG tablet Take 20 mg by mouth daily.    Marland Kitchen atorvastatin  (LIPITOR) 20 MG tablet TAKE 1 TABLET BY MOUTH ONCE A DAY 90 tablet 0  . BYSTOLIC 10 MG tablet Take 10 mg by mouth 2 (two) times daily.     . ergocalciferol (VITAMIN D2) 50000 UNITS capsule Take 50,000 Units by mouth once a week. On wednesday    . furosemide (LASIX) 40 MG tablet Take 40 mg by mouth as needed.    . hydrochlorothiazide (HYDRODIURIL) 25 MG tablet Take 1 tablet (25 mg total) by mouth daily. 90 tablet 3  . insulin lispro protamine-lispro (HUMALOG 75/25 MIX) (75-25) 100 UNIT/ML SUSP injection Inject 35 Units into the skin 2 (two) times daily with a meal. 40 units in am and 35 in pm    . levETIRAcetam (KEPPRA) 500 MG tablet Take 1 tablet (500 mg total) by mouth 2 (two) times daily. 180 tablet 3  . lisinopril (PRINIVIL,ZESTRIL) 10 MG tablet Take 1 tablet (10 mg total) by mouth every evening. 90 tablet 3  . metFORMIN (GLUCOPHAGE-XR) 500 MG 24 hr tablet Take 500 mg by mouth 2 (two) times daily.     . Multiple Vitamin (MULTIVITAMIN WITH MINERALS) TABS tablet Take 1 tablet by mouth daily.    . pantoprazole (PROTONIX) 40 MG tablet Take 40 mg by mouth every evening.     . potassium chloride (K-DUR) 10 MEQ tablet Take 1 tablet (10 mEq total) by mouth daily. 90 tablet 3  . sertraline (ZOLOFT) 100 MG tablet Take 100 mg by mouth every morning.      No facility-administered medications prior to visit.    PAST MEDICAL HISTORY: Past Medical History  Diagnosis Date  . HTN (hypertension)   . CAD (coronary artery disease)     a. Remote CABG in 2008 in [place].;  b. LHC (06/2013):  pLAD 50 then 40, dOM occluded, pRCA 50, mRCA 95, dRCA occluded, RV marginal 70, L-LAD ok, S-dCFX ok, S-RCA ok; EF 45% with AL HK  . DM (diabetes mellitus)   . HTN (hypertension)   . PAF (paroxysmal atrial fibrillation)     a. Not on Coumadin due to history of falls and SDH  . GERD (gastroesophageal reflux disease)   . Venous insufficiency   . Left bundle branch block   . Obesity   . Hx of echocardiogram     a. Echo  (06/2013):  Mod LVH, EF 50-55%, MAC, mild MR, mild LAE  . Ischemic cardiomyopathy     Myoview (06/2013):  Large infarct in apex and dAnt and sept wall, mild peri-infarct ischemia, EF 40%  . H/O sudden cardiac death successfully resuscitated     probably arrhythmic cause => ICD implanted  . S/P implantation of automatic cardioverter/defibrillator (AICD)     St Jude; Dr. Rayann Heman  . Carotid stenosis     Carotid US (07/2013):  Bilat ICA 1-39% (repeat 1 year).  . Generalized convulsive epilepsy without mention of intractable epilepsy 08/23/2013  . Vitamin D deficiency   . Polyneuropathy in diabetes(357.2)   . Anxiety   . Seizure     last seizure march 2015  . Cancer 1980's, left breast    breast, radiation done, mass removed from left stomach area was cancer also 1980's  . Automatic implantable cardioverter-defibrillator in situ     PAST SURGICAL HISTORY: Past Surgical History  Procedure Laterality Date  . Partial hysterectomy      1985, 1986 hysterectomy completed  . Total abdominal hysterectomy  1986  . Subdural hematoma evacuation via craniotomy  2012    bil x 2  . Implantable cardioverter defibrillator implant  06/14/13     [place] [address] model [id number] (serial Number [id number]) ICD implanted by Dr Rayann Heman for secondary prevention after an aborted sudden cardiac arrest  . Cholecystectomy  1980's  . Coronary artery bypass graft  2008    x3 2008.  [place] (Dr. Wende Neighbors[facility] [phone]  . Colonoscopy with propofol N/A 02/27/2014    Procedure: COLONOSCOPY WITH PROPOFOL;  Surgeon: Juanita Craver, MD;  Location: [facility];  Service: Endoscopy;  Laterality: N/A;    FAMILY HISTORY: Family History  Problem Relation Age of Onset  . Emphysema Father   . Hypertension Father   . Hypertension Mother   . Stroke Mother   . Diabetes Mother   . Diabetes Brother   . Diabetes Sister     SOCIAL HISTORY: History   Social History  . Marital Status:  Widowed    Spouse Name: N/A    Number of Children: 7  . Years of Education: 53 th   Occupational History  . Retired    Social History Main Topics  . Smoking status: Never Smoker   . Smokeless tobacco: Never Used  . Alcohol Use: No  . Drug Use: No  . Sexual Activity: Not on file   Other Topics Concern  . Not on file   Social History Narrative   Patient is right handed, resides alone   Patient is a widowed.    Patient has 7 children.    Patient has a 11th grade education      PHYSICAL EXAM  Filed Vitals:   04/09/14 0830  BP: 137/67  Pulse: 69  Height: 5' 3.5" (1.613 m)  Weight: 189 lb (85.73 kg)   Body mass index is 32.95 kg/(m^2).  Generalized: Well developed, in no acute distress   Neurological examination  Mentation: Alert oriented to time, place, history taking. Follows all commands speech and language fluent Cranial nerve II-XII: Pupils were equal round reactive to light. Extraocular movements were full, visual field were full on confrontational test. Facial sensation and strength were normal. Uvula tongue midline. Head turning and shoulder shrug  were normal and symmetric. Motor: The motor testing reveals 5 over 5 strength of all 4 extremities. Good symmetric motor tone is noted throughout.  Sensory: Sensory testing is intact to soft touch on all 4 extremities. No evidence of extinction is noted.  Coordination: Cerebellar testing reveals good finger-nose-finger and heel-to-shin bilaterally.  Gait and station: Gait is normal. Tandem gait slightly unsteady. Romberg is negative. No drift is seen.  Reflexes: Deep tendon reflexes are symmetric and normal bilaterally.     DIAGNOSTIC DATA (LABS, IMAGING, TESTING) - I reviewed patient records, labs, notes, testing and imaging myself where available.  Lab Results  Component Value Date   WBC 8.9 08/03/2013   HGB 11.2* 08/03/2013   HCT 33.0* 08/03/2013   MCV 90.4 08/03/2013   PLT 126* 08/03/2013      Component  Value Date/Time   NA 140 11/28/2013 0850   K 4.4 11/28/2013 0850   CL 105 11/28/2013 0850   CO2 29 11/28/2013 0850   GLUCOSE 148* 11/28/2013 0850   BUN 24* 11/28/2013 0850   CREATININE 1.2 11/28/2013 0850   CALCIUM 9.3 11/28/2013 0850   PROT 6.5 11/28/2013 0850   ALBUMIN 3.8 11/28/2013 0850   AST 18 11/28/2013 0850   ALT 19 11/28/2013 0850   ALKPHOS 66 11/28/2013 0850   BILITOT 0.5 11/28/2013 0850   GFRNONAA 37* 08/03/2013 0457   GFRAA 42* 08/03/2013 0457   Lab Results  Component Value Date   CHOL 128 11/28/2013   HDL 46.00 11/28/2013   LDLCALC 60 11/28/2013   TRIG 111.0 11/28/2013   CHOLHDL 3 11/28/2013   Lab Results  Component Value Date   HGBA1C 7.5* 06/10/2013   Lab Results  Component Value Date   VITAMINB12 474 04/21/2009   Lab Results  Component Value Date   TSH 2.297 06/10/2013      ASSESSMENT AND PLAN 73 y.o. year old female  has a past medical history of HTN (hypertension); CAD (coronary artery disease); DM (diabetes mellitus); HTN (hypertension); PAF (paroxysmal atrial fibrillation); GERD (gastroesophageal reflux disease); Venous insufficiency; Left bundle branch block; Obesity; echocardiogram; Ischemic cardiomyopathy; H/O sudden cardiac death successfully resuscitated; S/P implantation of automatic cardioverter/defibrillator (AICD); Carotid stenosis; Generalized convulsive epilepsy without mention of intractable epilepsy (08/23/2013); Vitamin D deficiency; Polyneuropathy in diabetes(357.2); Anxiety; Seizure; Cancer (1980's, left breast); and Automatic implantable cardioverter-defibrillator in situ. here with;  1. Seizures  Patient seizures have been controlled with Keppra. She will continue Keppra, no refills needed today. The patient started back driving last month. If she has any additional seizures she should let us know. Follow up in 6 months or sooner if needed.   Ward Givens, MSN, NP-C 04/09/2014, 8:33 AM [facility] [address] [place], [place] [address] [phone]  Note: This document was prepared with digital dictation and possible smart phrase technology. Any transcriptional errors that result from this process are unintentional.

## 2014-04-09 NOTE — Telephone Encounter (Signed)
Spoke with pt and reminded pt of remote transmission that is due today. Pt verbalized understanding.   

## 2014-04-11 ENCOUNTER — Encounter: Payer: Self-pay | Admitting: Cardiology

## 2014-04-16 ENCOUNTER — Telehealth: Payer: Self-pay | Admitting: *Deleted

## 2014-04-16 NOTE — Telephone Encounter (Signed)
Requesting call back from [facility]. Routing to [employer]

## 2014-04-17 ENCOUNTER — Ambulatory Visit (INDEPENDENT_AMBULATORY_CARE_PROVIDER_SITE_OTHER): Payer: PRIVATE HEALTH INSURANCE | Admitting: *Deleted

## 2014-04-17 DIAGNOSIS — Z95810 Presence of automatic (implantable) cardiac defibrillator: Secondary | ICD-10-CM

## 2014-04-17 DIAGNOSIS — I255 Ischemic cardiomyopathy: Secondary | ICD-10-CM

## 2014-04-17 NOTE — Telephone Encounter (Signed)
F/u ° ° °Pt returning your call °

## 2014-04-17 NOTE — Telephone Encounter (Signed)
I left a message for the patient to call. 

## 2014-04-17 NOTE — Telephone Encounter (Signed)
I spoke with the patient. She states that she tried to transmit last week, but the lights and beeping got caught up on the picture of the tower on her transmitter for 2 hours, so she turned it off. She has not tried to resend. I have asked that she try to send again as turning off the monitor may have reset it for her.

## 2014-04-17 NOTE — Telephone Encounter (Signed)
I spoke with the patient. 

## 2014-04-17 NOTE — Progress Notes (Signed)
EPIC Encounter for ICM Monitoring  Patient Name: Alexandra [name] is a 73 y.o. female Date: 04/17/2014 Primary Care Physican: Stephens Shire, MD Primary Cardiologist: Nahser Electrophysiologist: Allred Dry Weight: 180 lbs       In the past month, have you:  1. Gained more than 2 pounds in a day or more than 5 pounds in a week? no  2. Had changes in your medications (with verification of current medications)? no  3. Had more shortness of breath than is usual for you? no  4. Limited your activity because of shortness of breath? no  5. Not been able to sleep because of shortness of breath? no  6. Had increased swelling in your feet or ankles? no  7. Had symptoms of dehydration (dizziness, dry mouth, increased thirst, decreased urine output) no  8. Had changes in sodium restriction? no  9. Been compliant with medication? Yes   ICM trend:   Follow-up plan: ICM clinic phone appointment: 06/12/14. The patient is doing well. She is going to go to [place] from Thanksgiving - Christmas to be with her grandchildren. I will follow back up with her in January. She is to see Dr. Acie Fredrickson on 06/13/14. I have advised the patient to be cautious with her diet over the holidays and to please call with any concerns.  Copy of note sent to patient's primary care physician, primary cardiologist, and device following physician.  Alvis Lemmings, RN, BSN 04/17/2014 12:58 PM

## 2014-04-17 NOTE — Telephone Encounter (Signed)
F/u   Pt need to speak to you.

## 2014-04-23 ENCOUNTER — Telehealth: Payer: Self-pay | Admitting: Nurse Practitioner

## 2014-04-23 ENCOUNTER — Encounter: Payer: Self-pay | Admitting: Neurology

## 2014-04-23 NOTE — Telephone Encounter (Signed)
Received many BP readings through the mail from the patient.  These readings range from 124-580'D systolic and 98'P-38'S diastolic with pulse readings of 60's to 70's bpm.  Dr. Acie Fredrickson looked through these readings and stated they are excellent, for patient to continue current treatment plan.  I called and left this message on patient's personal voice mail and advised that she may call the office with questions or concerns.

## 2014-04-29 ENCOUNTER — Encounter: Payer: Self-pay | Admitting: Neurology

## 2014-05-05 ENCOUNTER — Telehealth: Payer: Self-pay | Admitting: Internal Medicine

## 2014-05-05 NOTE — Telephone Encounter (Signed)
New message     Pt is out of town. Last night she had a little chest pain in the middle of her chest.  She is ok now.  Do you want her to send a remote transmission?

## 2014-05-05 NOTE — Telephone Encounter (Signed)
Spoke with patient who had c/o chest discomfort between the breasts, "but not enough to go to the ER" , last night.  She is currently out of town.  I instructed her go to the nearest ER if the pain returns.  She has an office visit scheduled for 1/16.

## 2014-05-06 ENCOUNTER — Other Ambulatory Visit: Payer: PRIVATE HEALTH INSURANCE

## 2014-05-06 ENCOUNTER — Ambulatory Visit: Payer: PRIVATE HEALTH INSURANCE | Admitting: Cardiovascular Disease

## 2014-05-15 ENCOUNTER — Encounter (HOSPITAL_COMMUNITY): Payer: Self-pay | Admitting: Cardiovascular Disease

## 2014-06-12 ENCOUNTER — Other Ambulatory Visit (INDEPENDENT_AMBULATORY_CARE_PROVIDER_SITE_OTHER): Payer: Medicare Other | Admitting: *Deleted

## 2014-06-12 ENCOUNTER — Ambulatory Visit (INDEPENDENT_AMBULATORY_CARE_PROVIDER_SITE_OTHER): Payer: Medicare Other | Admitting: Cardiovascular Disease

## 2014-06-12 ENCOUNTER — Encounter: Payer: Self-pay | Admitting: Cardiovascular Disease

## 2014-06-12 VITALS — BP 150/80 | HR 67 | Ht 63.5 in | Wt 183.6 lb

## 2014-06-12 DIAGNOSIS — I10 Essential (primary) hypertension: Secondary | ICD-10-CM

## 2014-06-12 DIAGNOSIS — I480 Paroxysmal atrial fibrillation: Secondary | ICD-10-CM

## 2014-06-12 DIAGNOSIS — I2510 Atherosclerotic heart disease of native coronary artery without angina pectoris: Secondary | ICD-10-CM

## 2014-06-12 LAB — BASIC METABOLIC PANEL
BUN: 25 mg/dL — ABNORMAL HIGH (ref 6–23)
CO2: 25 meq/L (ref 19–32)
Calcium: 10 mg/dL (ref 8.4–10.5)
Chloride: 106 mEq/L (ref 96–112)
Creatinine, Ser: 1.6 mg/dL — ABNORMAL HIGH (ref 0.4–1.2)
GFR: 34.52 mL/min — ABNORMAL LOW (ref >60.00)
GLUCOSE: 168 mg/dL — AB (ref 70–99)
Potassium: 4.3 mEq/L (ref 3.5–5.1)
SODIUM: 139 meq/L (ref 135–145)

## 2014-06-12 NOTE — Addendum Note (Signed)
Addended by: Eulis Foster on: 06/12/2014 10:21 AM   Modules accepted: Orders

## 2014-06-12 NOTE — Assessment & Plan Note (Signed)
BP is a bit high today.  She has not been sleeping well. cotninue same meds.

## 2014-06-12 NOTE — Assessment & Plan Note (Signed)
Continues to maintain NSR.

## 2014-06-12 NOTE — Patient Instructions (Signed)
Your physician recommends that you continue on your current medications as directed. Please refer to the Current Medication list given to you today.  Your physician recommends that you schedule a follow-up appointment in: 6 months with Dr. Nahser  

## 2014-06-12 NOTE — Assessment & Plan Note (Signed)
Patient is doing well Has some atypical CP, none that sound like angina Will continue to watch.

## 2014-06-12 NOTE — Progress Notes (Signed)
Alexandra [name] Date of Birth  [birth year] [place] [address]    [place]   [place], [place]  [address]    [place], [place]  [address] [phone]  Fax  [phone]  [phone]  Fax [phone]  Problem list: 1. Atrial fibrillation-not a Coumadin candidate because of a series of falls and subdural hematoma 2. Diabetes mellitus 3. Hyperlipidemia 4. History of breast cancer 5. CAD - CABG - 2008  6. Cardiac arrest - s/p ICD ( Jan. 2015) , AICD on left side of chest  7. Seizures -    History of Present Illness:  [name] was hospitalized this winter after falling and having a subdural hematoma. She is seen today in followup.  Clinically she still is in sinus rhythm.  She's not had any episodes of dizziness or headaches.  Her balance is now good.  Has not had any falling recently.  Feb. 19, 2014: Alexandra [name] has done well since I saw last her. Her blood pressure has been a little elevated. She was started on lisinopril 5 mg a day by her medical doctor. She's scheduled have blood work for. She's had a reaction to ACE inhibitors in the past.  She seems to be tolerating it well.  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  Oct 31, 2013:  Alexandra [name] is feeling well.   She had not had any further falls but she has had a seizure.  She has a hx of subdural hematoma.  She is walking but notes that she gets fatigued.   Jan. 7, 2016:  Alexandra [name] is a 74 yo with hx of CAD , CABG, atrial fib - not on coumadin due to falls and a subsequent subdural hematoma. She showed up for her apt. A day early. Occasional CP, not severe.  Last for second or two. Walks regularly.   Her brother passed away a week ago.   Has not been sleeping.  As a result, BP is elevated.  Has dizzy spells, no recent falls.  C/o some soreness of defibrillator site.   Current Outpatient Prescriptions on File Prior to Visit  Medication Sig Dispense Refill  . amLODipine (NORVASC) 5  MG tablet Take 5 mg by mouth 2 (two) times daily.     Marland Kitchen aspirin 81 MG tablet Take 81 mg by mouth daily.    Marland Kitchen atorvastatin (LIPITOR) 20 MG tablet Take 20 mg by mouth daily.    Marland Kitchen BYSTOLIC 10 MG tablet Take 10 mg by mouth 2 (two) times daily.     . ergocalciferol (VITAMIN D2) 50000 UNITS capsule Take 50,000 Units by mouth once a week. On wednesday    . hydrochlorothiazide (HYDRODIURIL) 25 MG tablet Take 1 tablet (25 mg total) by mouth daily. 90 tablet 3  . insulin lispro protamine-lispro (HUMALOG 75/25 MIX) (75-25) 100 UNIT/ML SUSP injection Inject 35 Units into the skin 2 (two) times daily with a meal. 40 units in am and 35 in pm    . levETIRAcetam (KEPPRA) 500 MG tablet Take 1 tablet (500 mg total) by mouth 2 (two) times daily. 180 tablet 3  . lisinopril (PRINIVIL,ZESTRIL) 10 MG tablet Take 1 tablet (10 mg total) by mouth every evening. 90 tablet 3  . metFORMIN (GLUCOPHAGE-XR) 500 MG 24 hr tablet Take 500 mg by mouth 2 (two) times daily.     . Multiple Vitamin (MULTIVITAMIN WITH MINERALS) TABS tablet Take 1 tablet by mouth daily.    Marland Kitchen  pantoprazole (PROTONIX) 40 MG tablet Take 40 mg by mouth every evening.     . potassium chloride (K-DUR) 10 MEQ tablet Take 1 tablet (10 mEq total) by mouth daily. 90 tablet 3  . sertraline (ZOLOFT) 100 MG tablet Take 100 mg by mouth every morning.      No current facility-administered medications on file prior to visit.    Allergies  Allergen Reactions  . Sulfa Antibiotics Rash    Past Medical History  Diagnosis Date  . HTN (hypertension)   . CAD (coronary artery disease)     a. Remote CABG in 2008 in [place].;  b. LHC (06/2013):  pLAD 50 then 40, dOM occluded, pRCA 50, mRCA 95, dRCA occluded, RV marginal 70, L-LAD ok, S-dCFX ok, S-RCA ok; EF 45% with AL HK  . DM (diabetes mellitus)   . HTN (hypertension)   . PAF (paroxysmal atrial fibrillation)     a. Not on Coumadin due to history of falls and SDH  . GERD (gastroesophageal reflux disease)   . Venous  insufficiency   . Left bundle branch block   . Obesity   . Hx of echocardiogram     a. Echo (06/2013):  Mod LVH, EF 50-55%, MAC, mild MR, mild LAE  . Ischemic cardiomyopathy     Myoview (06/2013):  Large infarct in apex and dAnt and sept wall, mild peri-infarct ischemia, EF 40%  . H/O sudden cardiac death successfully resuscitated     probably arrhythmic cause => ICD implanted  . S/P implantation of automatic cardioverter/defibrillator (AICD)     St Jude; Dr. Rayann Heman  . Carotid stenosis     Carotid US (07/2013):  Bilat ICA 1-39% (repeat 1 year).  . Generalized convulsive epilepsy without mention of intractable epilepsy 08/23/2013  . Vitamin D deficiency   . Polyneuropathy in diabetes(357.2)   . Anxiety   . Seizure     last seizure march 2015  . Cancer 1980's, left breast    breast, radiation done, mass removed from left stomach area was cancer also 1980's  . Automatic implantable cardioverter-defibrillator in situ     Past Surgical History  Procedure Laterality Date  . Partial hysterectomy      1985, 1986 hysterectomy completed  . Total abdominal hysterectomy  1986  . Subdural hematoma evacuation via craniotomy  2012    bil x 2  . Implantable cardioverter defibrillator implant  06/14/13     [place] DR model [id number] (serial Number [id number]) ICD implanted by Dr Rayann Heman for secondary prevention after an aborted sudden cardiac arrest  . Cholecystectomy  1980's  . Coronary artery bypass graft  2008    x3 2008.  [place] (Dr. Wende Neighbors) [facility] [phone]  . Colonoscopy with propofol N/A 02/27/2014    Procedure: COLONOSCOPY WITH PROPOFOL;  Surgeon: Juanita Craver, MD;  Location: [facility];  Service: Endoscopy;  Laterality: N/A;  . Left heart catheterization with coronary/graft angiogram N/A 06/14/2013    Procedure: LEFT HEART CATHETERIZATION WITH Beatrix Fetters;  Surgeon: Troy Sine, MD;  Location: [facility] CATH LAB;  Service: Cardiovascular;   Laterality: N/A;  . Implantable cardioverter defibrillator implant N/A 06/14/2013    Procedure: IMPLANTABLE CARDIOVERTER DEFIBRILLATOR IMPLANT;  Surgeon: Coralyn Mark, MD;  Location: [facility] C[facility] LAB;  Service: Cardiovascular;  Laterality: N/A;    History  Smoking status  . Never Smoker   Smokeless tobacco  . Never Used    History  Alcohol Use No    Family  History  Problem Relation Age of Onset  . Emphysema Father   . Hypertension Father   . Hypertension Mother   . Stroke Mother   . Diabetes Mother   . Diabetes Brother   . Diabetes Sister     Reviw of Systems:  Reviewed in the HPI.  All other systems are negative.  Physical Exam: Blood pressure 150/80, pulse 67, height 5' 3.5" (1.613 m), weight 183 lb 9.6 oz (83.28 kg), SpO2 99 %. General: Well developed, well nourished, in no acute distress.  Head: Normocephalic, atraumatic, sclera non-icteric, mucus membranes are moist,   Neck: Supple. Carotids are 2 + without bruits. No JVD  Lungs: Clear bilaterally to auscultation.  AICD in left upper chest   Heart: regular rate  With normal  S1 S2. No murmurs, gallops or rubs.  Abdomen: Soft, non-tender, non-distended with normal bowel sounds. No hepatomegaly. No rebound/guarding. No masses.  Msk:  Strength and tone are normal  Extremities: No clubbing or cyanosis. No edema.  Distal pedal pulses are 2+ and equal bilaterally.  Neuro: Alert and oriented X 3. Moves all extremities spontaneously.  Psych:  Responds to questions appropriately with a normal affect.  ECG: 07/25/2012:  Sinus brady at 59  NS IVCD.   Assessment / Plan:

## 2014-06-13 ENCOUNTER — Ambulatory Visit: Payer: PRIVATE HEALTH INSURANCE | Admitting: Cardiovascular Disease

## 2014-06-13 ENCOUNTER — Other Ambulatory Visit: Payer: PRIVATE HEALTH INSURANCE

## 2014-07-03 ENCOUNTER — Encounter: Payer: Self-pay | Admitting: *Deleted

## 2014-07-03 ENCOUNTER — Telehealth: Payer: Self-pay | Admitting: *Deleted

## 2014-07-03 NOTE — Telephone Encounter (Signed)
The patient called today to follow up on why she had not heard from me regarding her most recent ICM transmission. I apologized that she was not on my schedule in EPIC and so I missed seeing her remote. This did come in on 06/19/14. She denies any change in her weight, symptoms, or medications. She reports that her brother did pass away unexpectedly on 12/31. She was very close to him and this has been very difficult for her. The patient's corvue readings were reading slightly elevated from ~ 12/22-1/1, but she does not recall eating any differently over the holidays. I advised her I would recheck her transmission again on 08/04/14. She had recall in with Dr. Rayann Heman for 09/2014. I have scheduled her to follow up on 09/08/14 with him.

## 2014-07-22 ENCOUNTER — Other Ambulatory Visit: Payer: Self-pay | Admitting: Cardiovascular Disease

## 2014-07-23 NOTE — Telephone Encounter (Signed)
Last OV with Dr. Mertie Moores on 06/12/14 - 6 month F/U (12/2014) - No changes were made to this medication. BMET also on 06/12/14 with normal Potassium results.  Refill sent in for: Potassium (K-DUR) 10 mEq - 1 tablet QD - #90 (1 ref) to [facility] in [place]

## 2014-08-04 ENCOUNTER — Telehealth: Payer: Self-pay | Admitting: Cardiology

## 2014-08-04 ENCOUNTER — Telehealth: Payer: Self-pay | Admitting: Internal Medicine

## 2014-08-04 ENCOUNTER — Encounter: Payer: Medicare Other | Admitting: *Deleted

## 2014-08-04 NOTE — Telephone Encounter (Signed)
I spoke with the patient. She is in [place]. She has her transmitter. I asked her to hook this up and send a manuel transmission when she is available to do so. She voices understanding.

## 2014-08-04 NOTE — Telephone Encounter (Signed)
LMOVM reminding pt to send remote transmission.   

## 2014-08-04 NOTE — Telephone Encounter (Signed)
New message      Returning Heather's call.  Pt left a different number

## 2014-08-12 NOTE — Telephone Encounter (Signed)
No transmission received. Rescheduled to 09/04/14.

## 2014-08-13 ENCOUNTER — Other Ambulatory Visit: Payer: Medicare Other

## 2014-09-04 ENCOUNTER — Encounter: Payer: Medicare Other | Admitting: *Deleted

## 2014-09-04 ENCOUNTER — Telehealth: Payer: Self-pay | Admitting: Cardiology

## 2014-09-04 NOTE — Telephone Encounter (Signed)
LMOVM reminding pt to send remote transmission.   

## 2014-09-05 ENCOUNTER — Encounter: Payer: Self-pay | Admitting: Cardiology

## 2014-09-08 ENCOUNTER — Encounter: Payer: Medicare Other | Admitting: Internal Medicine

## 2014-10-07 ENCOUNTER — Encounter: Payer: Self-pay | Admitting: *Deleted

## 2014-10-10 ENCOUNTER — Ambulatory Visit: Payer: Medicaid Other | Admitting: Neurology

## 2014-10-10 ENCOUNTER — Telehealth: Payer: Self-pay

## 2014-10-10 NOTE — Telephone Encounter (Signed)
Patient did not come to a follow up appointment. 

## 2014-11-07 ENCOUNTER — Encounter: Payer: Self-pay | Admitting: *Deleted

## 2014-12-02 ENCOUNTER — Encounter: Payer: Self-pay | Admitting: *Deleted

## 2014-12-22 ENCOUNTER — Encounter: Payer: Medicare Other | Admitting: *Deleted

## 2014-12-22 ENCOUNTER — Telehealth: Payer: Self-pay | Admitting: Cardiology

## 2014-12-22 NOTE — Telephone Encounter (Signed)
Attempted to confirm remote transmission with pt. No answer and was unable to leave a message.   

## 2014-12-23 ENCOUNTER — Encounter: Payer: Self-pay | Admitting: Cardiology

## 2014-12-30 ENCOUNTER — Encounter: Payer: Self-pay | Admitting: *Deleted

## 2015-01-24 ENCOUNTER — Other Ambulatory Visit: Payer: Self-pay | Admitting: Cardiovascular Disease

## 2015-01-28 ENCOUNTER — Other Ambulatory Visit: Payer: Self-pay | Admitting: Cardiovascular Disease

## 2015-01-28 ENCOUNTER — Telehealth: Payer: Self-pay | Admitting: Cardiology

## 2015-01-28 ENCOUNTER — Encounter: Payer: Self-pay | Admitting: *Deleted

## 2015-01-28 NOTE — Telephone Encounter (Signed)
Attempted to confirm remote transmission with pt. No answer and was unable to leave a message. (Voicemail was full)

## 2015-01-29 ENCOUNTER — Encounter: Payer: Self-pay | Admitting: Cardiology

## 2015-01-30 ENCOUNTER — Encounter: Payer: Self-pay | Admitting: *Deleted

## 2015-02-12 IMAGING — CT CT HEAD W/O CM
1 series · 16 of 30 positions shown, 20 images · non-contrast
Comparison: 06/11/2011.

CLINICAL DATA: Altered mental status.

EXAM:
CT HEAD WITHOUT CONTRAST
TECHNIQUE: Contiguous axial images were obtained from the base of the skull
through the vertex without intravenous contrast.

[Series 2: head 5.0 h30s · axial · 0.43mm/px · z∈[-205,-65]mm · 16 of 32 slices shown, 20 images]
[im 2/32  brain]
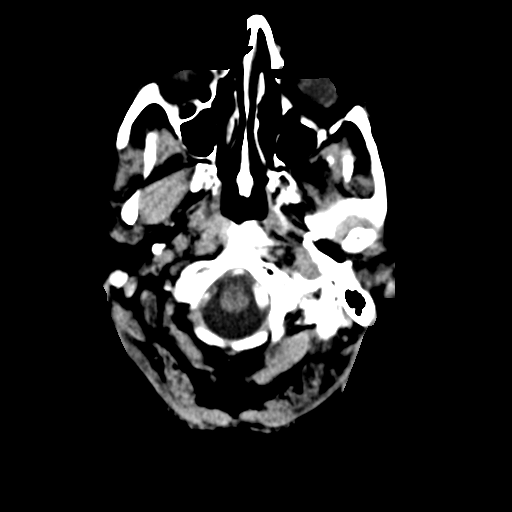
[im 2/32  bone]
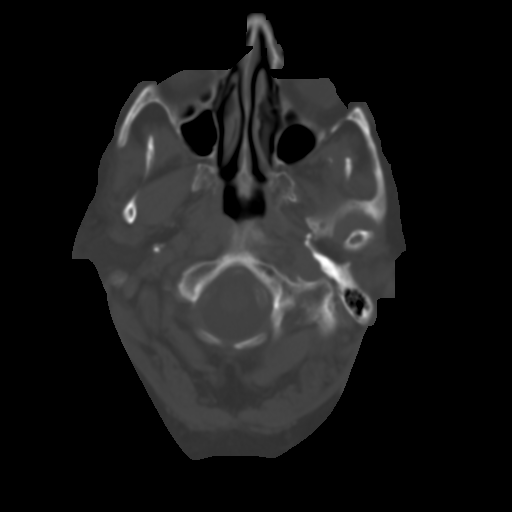
[im 4/32  brain]
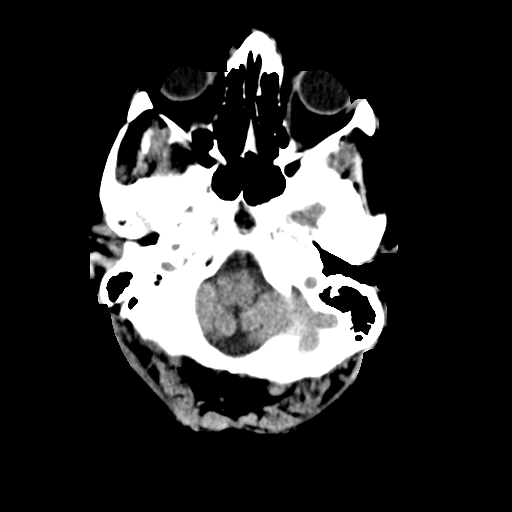
[im 6/32  brain]
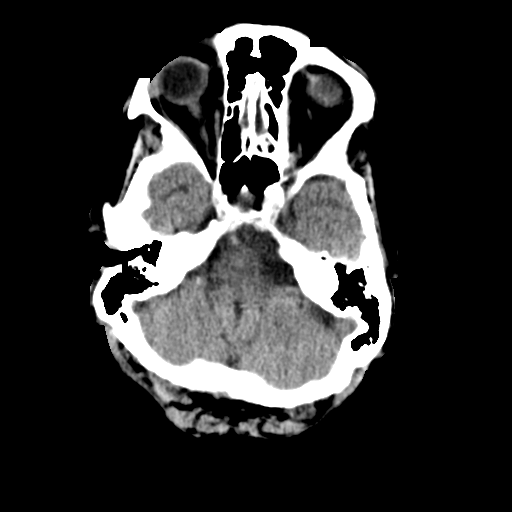
[im 8/32  brain]
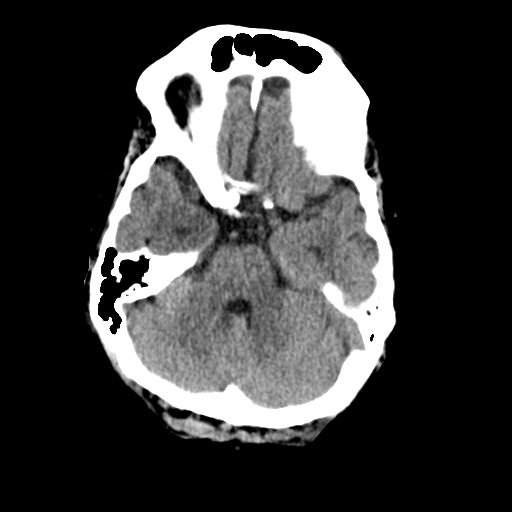
[im 9/32  brain]
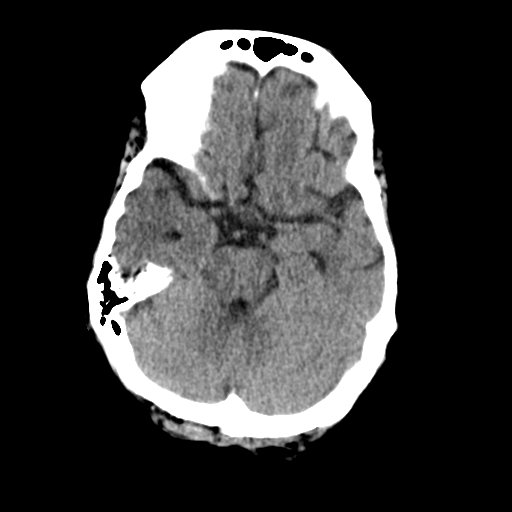
[im 9/32  bone]
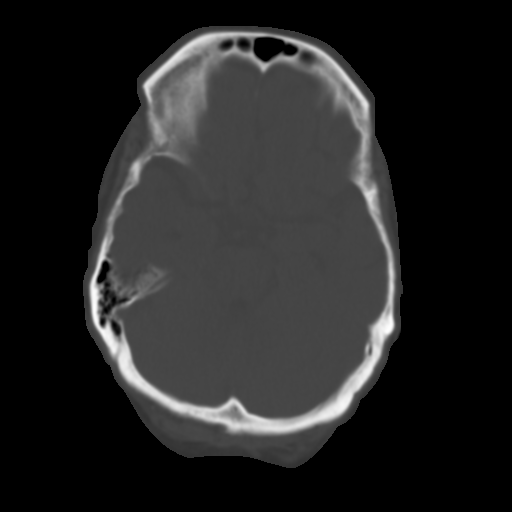
[im 11/32  brain]
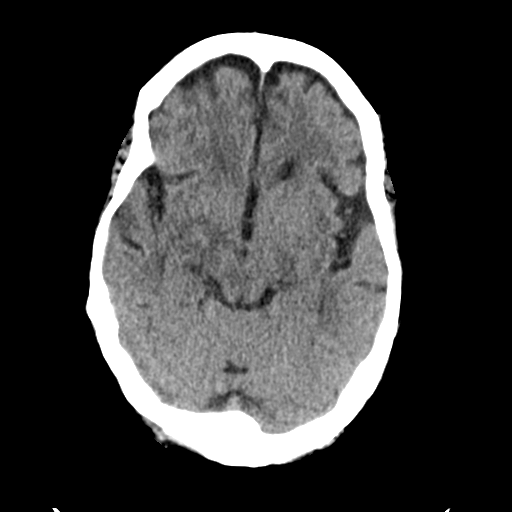
[im 13/32  brain]
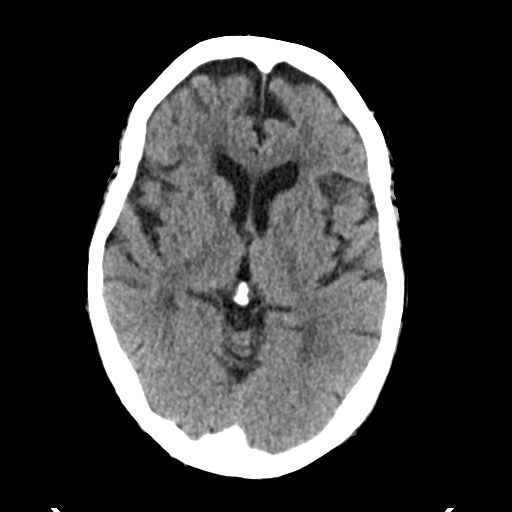
[im 15/32  brain]
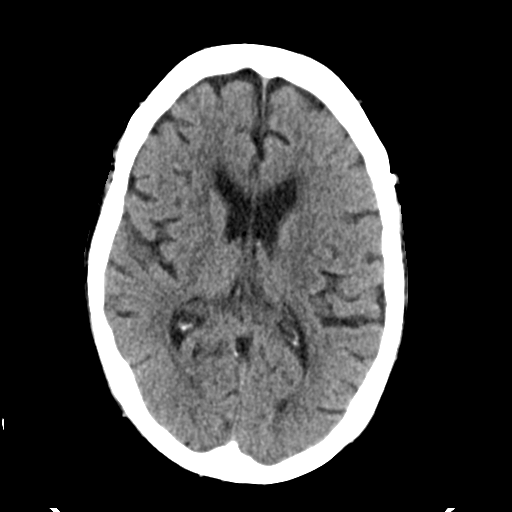
[im 17/32  brain]
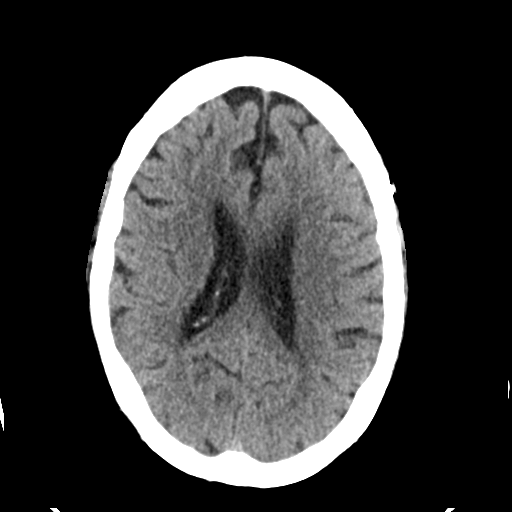
[im 17/32  bone]
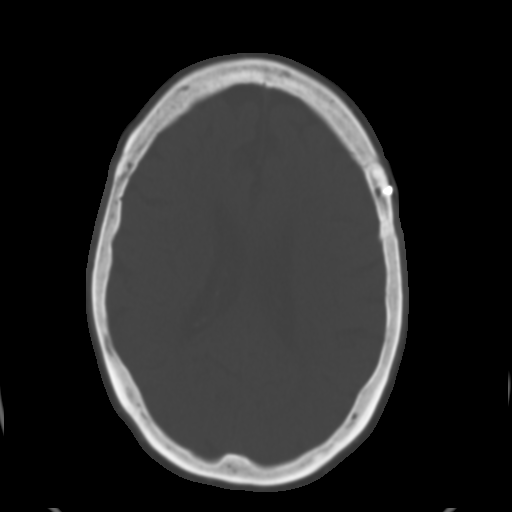
[im 19/32  brain]
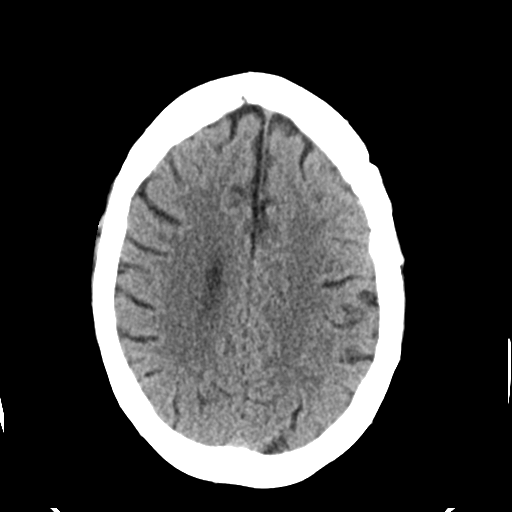
[im 21/32  brain]
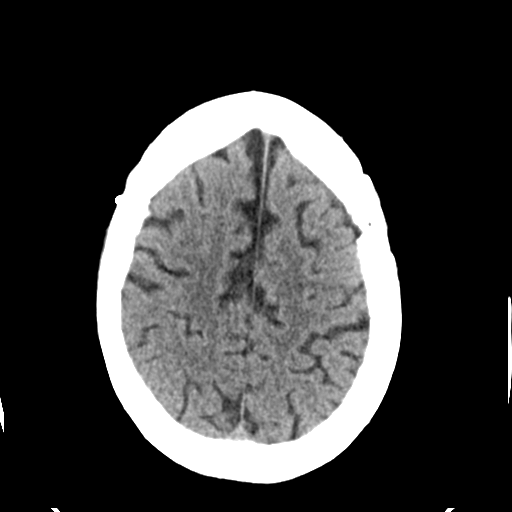
[im 23/32  brain]
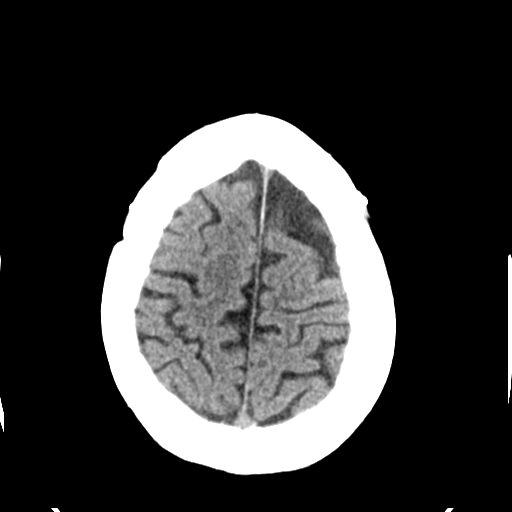
[im 24/32  brain]
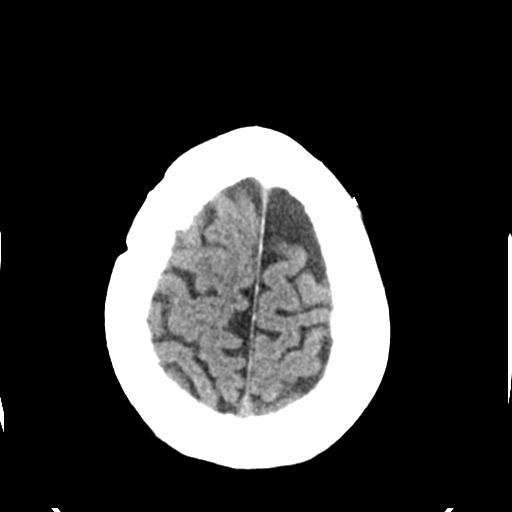
[im 24/32  bone]
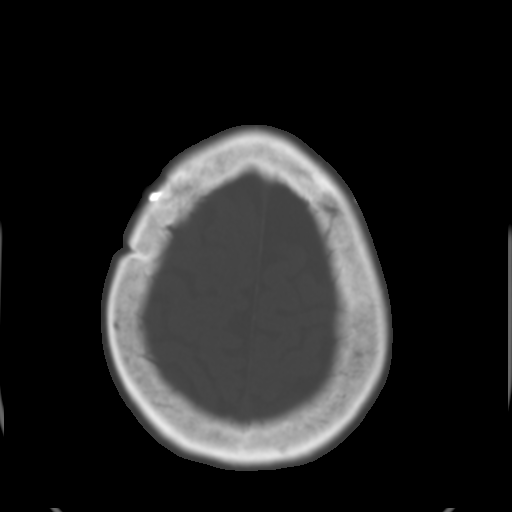
[im 26/32  brain]
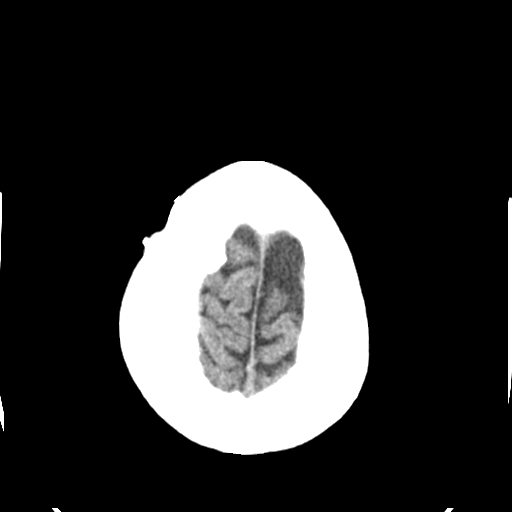
[im 28/32  brain]
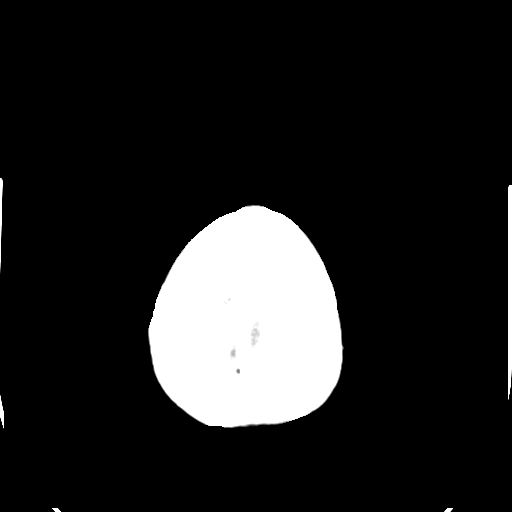
[im 30/32  brain]
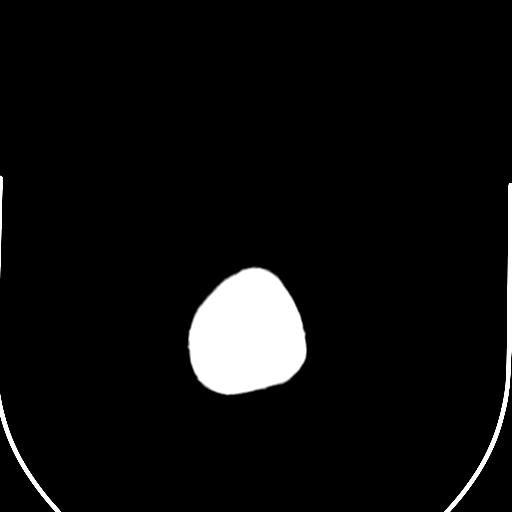

[16 of 30 positions shown; findings below may reference images not displayed]

FINDINGS: Old bifrontal craniotomies. On the scout images, there is a wire
projecting over the skull which is not visualized on the axial
images. Mastoid air cells and paranasal sinuses are within normal
limits. Intracranial atherosclerosis. No mass lesion, mass effect,
midline shift, hydrocephalus, hemorrhage. No acute territorial
cortical ischemia/infarct. Atrophy and chronic ischemic white matter
disease is present.
IMPRESSION: Atrophy and chronic ischemic white matter disease without acute
intracranial abnormality.

## 2015-02-12 IMAGING — CR DG CHEST 1V PORT
1 series · 1 of 1 positions shown · non-contrast
Comparison: 03/21/2011 chest x-ray.

CLINICAL DATA: Post arrest.  Diabetic hypertensive patient.

EXAM:
PORTABLE CHEST - 1 VIEW

[AP]
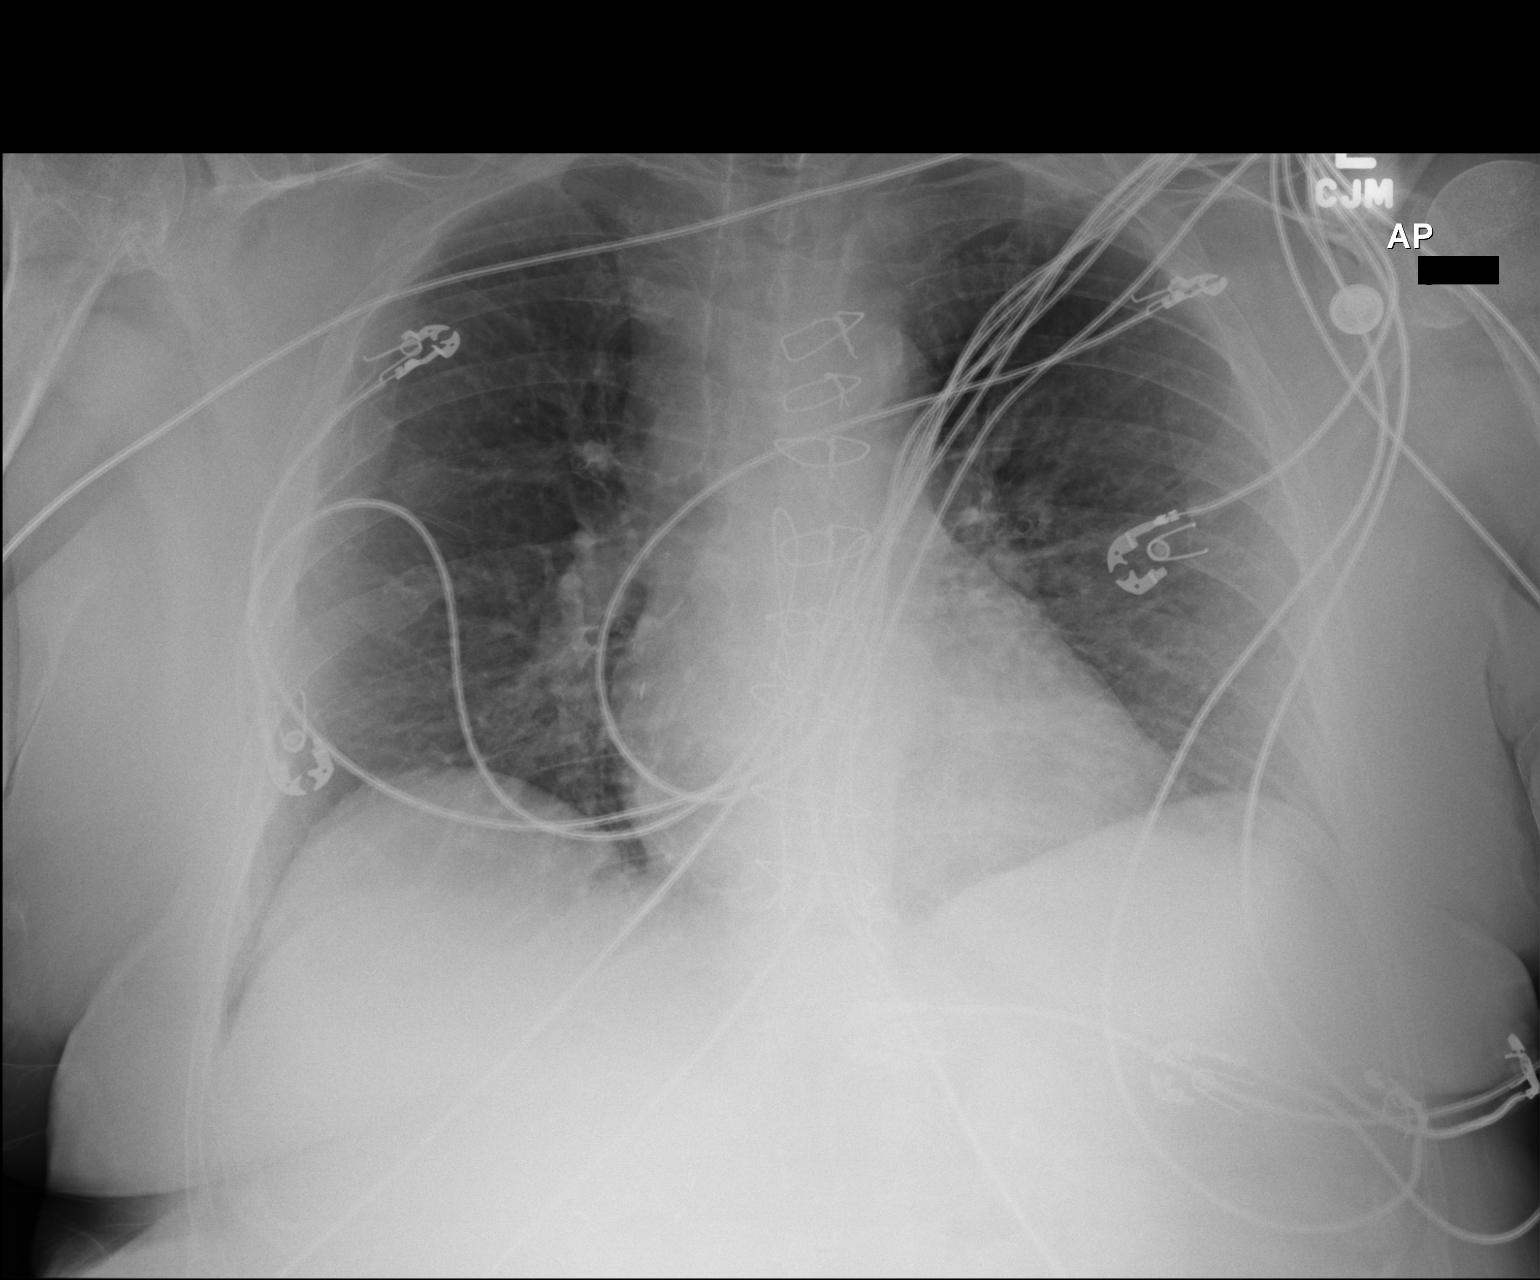

[1 of 1 positions shown; findings below may reference images not displayed]

FINDINGS: Post CABG.  Heart size top-normal.

Pulmonary vascular prominence most notable centrally without frank
pulmonary edema.

No segmental infiltrate or gross pneumothorax.

Remote right-sided rib fractures.
IMPRESSION: Post CABG. Heart size top-normal.

Pulmonary vascular prominence most notable centrally without frank
pulmonary edema.

Remote right-sided rib fractures.

## 2015-04-25 ENCOUNTER — Other Ambulatory Visit: Payer: Self-pay | Admitting: Cardiovascular Disease

## 2022-11-05 DEATH — deceased
